# Patient Record
Sex: Male | Born: 1935 | Race: White | Hispanic: No | Smoking: Never smoker
Health system: Southern US, Community
[De-identification: ages and names within clinical notes are randomized; demographics above are authoritative.]

## PROBLEM LIST (undated history)

## (undated) DIAGNOSIS — T07XXXA Unspecified multiple injuries, initial encounter: Secondary | ICD-10-CM

## (undated) DIAGNOSIS — I10 Essential (primary) hypertension: Secondary | ICD-10-CM

## (undated) DIAGNOSIS — C801 Malignant (primary) neoplasm, unspecified: Secondary | ICD-10-CM

## (undated) DIAGNOSIS — Z8546 Personal history of malignant neoplasm of prostate: Secondary | ICD-10-CM

## (undated) DIAGNOSIS — R0789 Other chest pain: Secondary | ICD-10-CM

## (undated) DIAGNOSIS — I6529 Occlusion and stenosis of unspecified carotid artery: Secondary | ICD-10-CM

## (undated) DIAGNOSIS — IMO0002 Reserved for concepts with insufficient information to code with codable children: Secondary | ICD-10-CM

## (undated) DIAGNOSIS — M40209 Unspecified kyphosis, site unspecified: Secondary | ICD-10-CM

## (undated) DIAGNOSIS — E785 Hyperlipidemia, unspecified: Secondary | ICD-10-CM

## (undated) HISTORY — PX: TONSILLECTOMY: SUR1361

## (undated) HISTORY — PX: PROSTATE SURGERY: SHX751

## (undated) HISTORY — DX: Essential (primary) hypertension: I10

## (undated) HISTORY — PX: OTHER SURGICAL HISTORY: SHX169

## (undated) HISTORY — DX: Unspecified multiple injuries, initial encounter: T07.XXXA

## (undated) HISTORY — DX: Reserved for concepts with insufficient information to code with codable children: IMO0002

## (undated) HISTORY — DX: Other chest pain: R07.89

## (undated) HISTORY — DX: Unspecified kyphosis, site unspecified: M40.209

## (undated) HISTORY — DX: Occlusion and stenosis of unspecified carotid artery: I65.29

## (undated) HISTORY — DX: Personal history of malignant neoplasm of prostate: Z85.46

## (undated) HISTORY — DX: Malignant (primary) neoplasm, unspecified: C80.1

## (undated) HISTORY — DX: Hyperlipidemia, unspecified: E78.5

---

## 1977-09-08 HISTORY — PX: INGUINAL HERNIA REPAIR: SUR1180

## 1988-09-08 HISTORY — PX: OTHER SURGICAL HISTORY: SHX169

## 1998-01-15 ENCOUNTER — Emergency Department (HOSPITAL_COMMUNITY): Admission: EM | Admit: 1998-01-15 | Discharge: 1998-01-15 | Payer: Self-pay | Admitting: Emergency Medicine

## 1998-02-20 ENCOUNTER — Emergency Department (HOSPITAL_COMMUNITY): Admission: EM | Admit: 1998-02-20 | Discharge: 1998-02-20 | Payer: Self-pay | Admitting: Critical Care Medicine

## 1998-02-22 ENCOUNTER — Emergency Department (HOSPITAL_COMMUNITY): Admission: EM | Admit: 1998-02-22 | Discharge: 1998-02-23 | Payer: Self-pay | Admitting: Emergency Medicine

## 1999-02-04 ENCOUNTER — Emergency Department (HOSPITAL_COMMUNITY): Admission: EM | Admit: 1999-02-04 | Discharge: 1999-02-04 | Payer: Self-pay | Admitting: Emergency Medicine

## 1999-12-08 LAB — HM DEXA SCAN: HM Dexa Scan: NORMAL

## 2002-09-08 HISTORY — PX: CARDIOVASCULAR STRESS TEST: SHX262

## 2003-01-31 LAB — HM COLONOSCOPY: HM Colonoscopy: ABNORMAL

## 2004-07-09 HISTORY — PX: OTHER SURGICAL HISTORY: SHX169

## 2004-07-18 ENCOUNTER — Ambulatory Visit: Payer: Self-pay | Admitting: Family Medicine

## 2004-07-30 ENCOUNTER — Ambulatory Visit: Payer: Self-pay

## 2005-01-08 ENCOUNTER — Ambulatory Visit: Payer: Self-pay | Admitting: Family Medicine

## 2005-06-22 ENCOUNTER — Emergency Department (HOSPITAL_COMMUNITY): Admission: EM | Admit: 2005-06-22 | Discharge: 2005-06-23 | Payer: Self-pay | Admitting: Emergency Medicine

## 2005-07-04 ENCOUNTER — Ambulatory Visit: Payer: Self-pay | Admitting: Internal Medicine

## 2005-07-08 ENCOUNTER — Ambulatory Visit: Payer: Self-pay | Admitting: Family Medicine

## 2005-07-08 LAB — CONVERTED CEMR LAB: PSA: 0.29 ng/mL

## 2005-09-23 ENCOUNTER — Ambulatory Visit: Payer: Self-pay | Admitting: Family Medicine

## 2005-10-09 ENCOUNTER — Ambulatory Visit: Payer: Self-pay | Admitting: Family Medicine

## 2005-10-13 LAB — FECAL OCCULT BLOOD, GUAIAC: Fecal Occult Blood: NEGATIVE

## 2005-12-17 ENCOUNTER — Ambulatory Visit: Payer: Self-pay | Admitting: Family Medicine

## 2005-12-22 ENCOUNTER — Ambulatory Visit: Payer: Self-pay | Admitting: Family Medicine

## 2006-04-07 ENCOUNTER — Ambulatory Visit: Payer: Self-pay | Admitting: Cardiology

## 2006-04-09 ENCOUNTER — Ambulatory Visit: Payer: Self-pay

## 2006-04-10 ENCOUNTER — Emergency Department (HOSPITAL_COMMUNITY): Admission: EM | Admit: 2006-04-10 | Discharge: 2006-04-10 | Payer: Self-pay | Admitting: Family Medicine

## 2006-05-06 ENCOUNTER — Ambulatory Visit: Payer: Self-pay | Admitting: Family Medicine

## 2006-05-07 ENCOUNTER — Encounter: Admission: RE | Admit: 2006-05-07 | Discharge: 2006-05-07 | Payer: Self-pay | Admitting: Family Medicine

## 2006-06-09 ENCOUNTER — Encounter: Admission: RE | Admit: 2006-06-09 | Discharge: 2006-06-09 | Payer: Self-pay | Admitting: Urology

## 2006-06-10 ENCOUNTER — Ambulatory Visit (HOSPITAL_BASED_OUTPATIENT_CLINIC_OR_DEPARTMENT_OTHER): Admission: RE | Admit: 2006-06-10 | Discharge: 2006-06-10 | Payer: Self-pay | Admitting: Urology

## 2006-06-10 ENCOUNTER — Encounter (INDEPENDENT_AMBULATORY_CARE_PROVIDER_SITE_OTHER): Payer: Self-pay | Admitting: Specialist

## 2006-08-21 ENCOUNTER — Ambulatory Visit: Payer: Self-pay | Admitting: Family Medicine

## 2007-03-25 ENCOUNTER — Emergency Department (HOSPITAL_COMMUNITY): Admission: EM | Admit: 2007-03-25 | Discharge: 2007-03-25 | Payer: Self-pay | Admitting: Emergency Medicine

## 2007-03-26 ENCOUNTER — Ambulatory Visit: Payer: Self-pay | Admitting: Family Medicine

## 2007-04-27 ENCOUNTER — Ambulatory Visit: Payer: Self-pay

## 2007-11-25 ENCOUNTER — Telehealth: Payer: Self-pay | Admitting: Family Medicine

## 2008-01-03 ENCOUNTER — Telehealth: Payer: Self-pay | Admitting: Family Medicine

## 2008-05-01 ENCOUNTER — Ambulatory Visit: Payer: Self-pay

## 2008-05-01 ENCOUNTER — Ambulatory Visit: Payer: Self-pay | Admitting: Cardiology

## 2009-02-12 ENCOUNTER — Emergency Department (HOSPITAL_COMMUNITY): Admission: EM | Admit: 2009-02-12 | Discharge: 2009-02-12 | Payer: Self-pay | Admitting: Emergency Medicine

## 2009-02-20 ENCOUNTER — Emergency Department (HOSPITAL_COMMUNITY): Admission: EM | Admit: 2009-02-20 | Discharge: 2009-02-20 | Payer: Self-pay | Admitting: Emergency Medicine

## 2009-02-21 ENCOUNTER — Ambulatory Visit: Payer: Self-pay | Admitting: Family Medicine

## 2009-02-21 DIAGNOSIS — E7800 Pure hypercholesterolemia, unspecified: Secondary | ICD-10-CM | POA: Insufficient documentation

## 2009-02-21 DIAGNOSIS — Z8546 Personal history of malignant neoplasm of prostate: Secondary | ICD-10-CM | POA: Insufficient documentation

## 2009-02-23 ENCOUNTER — Encounter: Payer: Self-pay | Admitting: Family Medicine

## 2009-02-23 DIAGNOSIS — Z8781 Personal history of (healed) traumatic fracture: Secondary | ICD-10-CM | POA: Insufficient documentation

## 2009-02-23 DIAGNOSIS — G8929 Other chronic pain: Secondary | ICD-10-CM | POA: Insufficient documentation

## 2009-02-23 DIAGNOSIS — M549 Dorsalgia, unspecified: Secondary | ICD-10-CM

## 2009-02-23 DIAGNOSIS — I6529 Occlusion and stenosis of unspecified carotid artery: Secondary | ICD-10-CM | POA: Insufficient documentation

## 2009-02-23 DIAGNOSIS — M542 Cervicalgia: Secondary | ICD-10-CM | POA: Insufficient documentation

## 2009-02-23 DIAGNOSIS — I10 Essential (primary) hypertension: Secondary | ICD-10-CM | POA: Insufficient documentation

## 2009-02-23 DIAGNOSIS — M47817 Spondylosis without myelopathy or radiculopathy, lumbosacral region: Secondary | ICD-10-CM | POA: Insufficient documentation

## 2009-02-23 LAB — CONVERTED CEMR LAB
ALT: 20 units/L (ref 0–53)
AST: 25 units/L (ref 0–37)
BUN: 13 mg/dL (ref 6–23)
CO2: 29 meq/L (ref 19–32)
Calcium: 8.8 mg/dL (ref 8.4–10.5)
Chloride: 96 meq/L (ref 96–112)
Cholesterol: 128 mg/dL (ref 0–200)
Creatinine, Ser: 0.9 mg/dL (ref 0.4–1.5)
GFR calc non Af Amer: 84.86 mL/min (ref >60)
Glucose, Bld: 89 mg/dL (ref 70–99)
HDL: 39 mg/dL — ABNORMAL LOW (ref >39.00)
LDL Cholesterol: 79 mg/dL (ref 0–99)
PSA: 0.18 ng/mL (ref 0.10–4.00)
Potassium: 4.3 meq/L (ref 3.5–5.1)
Sodium: 135 meq/L (ref 135–145)
Total CHOL/HDL Ratio: 3
Triglycerides: 49 mg/dL (ref 0.0–149.0)
VLDL: 9.8 mg/dL (ref 0.0–40.0)

## 2009-09-10 ENCOUNTER — Telehealth: Payer: Self-pay | Admitting: Cardiology

## 2009-11-09 ENCOUNTER — Emergency Department (HOSPITAL_COMMUNITY): Admission: EM | Admit: 2009-11-09 | Discharge: 2009-11-09 | Payer: Self-pay | Admitting: Family Medicine

## 2009-11-09 ENCOUNTER — Telehealth: Payer: Self-pay | Admitting: Cardiology

## 2009-11-09 ENCOUNTER — Telehealth: Payer: Self-pay | Admitting: Family Medicine

## 2009-12-07 ENCOUNTER — Inpatient Hospital Stay (HOSPITAL_COMMUNITY): Admission: EM | Admit: 2009-12-07 | Discharge: 2009-12-08 | Payer: Self-pay | Admitting: Emergency Medicine

## 2009-12-07 ENCOUNTER — Ambulatory Visit: Payer: Self-pay | Admitting: Cardiology

## 2009-12-07 ENCOUNTER — Telehealth: Payer: Self-pay | Admitting: Cardiology

## 2009-12-07 DIAGNOSIS — R0789 Other chest pain: Secondary | ICD-10-CM

## 2009-12-07 HISTORY — DX: Other chest pain: R07.89

## 2009-12-10 ENCOUNTER — Telehealth: Payer: Self-pay | Admitting: Cardiology

## 2009-12-12 ENCOUNTER — Telehealth: Payer: Self-pay | Admitting: Cardiology

## 2009-12-26 ENCOUNTER — Telehealth: Payer: Self-pay | Admitting: Cardiology

## 2010-01-08 ENCOUNTER — Ambulatory Visit: Payer: Self-pay | Admitting: Cardiology

## 2010-01-16 ENCOUNTER — Ambulatory Visit: Payer: Self-pay | Admitting: Family Medicine

## 2010-01-16 DIAGNOSIS — R350 Frequency of micturition: Secondary | ICD-10-CM | POA: Insufficient documentation

## 2010-01-16 LAB — CONVERTED CEMR LAB
Bilirubin Urine: NEGATIVE
Blood in Urine, dipstick: NEGATIVE
Casts: 0 /lpf
Glucose, Urine, Semiquant: NEGATIVE
Ketones, urine, test strip: NEGATIVE
Nitrite: NEGATIVE
Protein, U semiquant: NEGATIVE
RBC / HPF: 0
Specific Gravity, Urine: 1.01
Urobilinogen, UA: 0.2
WBC Urine, dipstick: NEGATIVE
Yeast, UA: 0
pH: 8

## 2010-01-17 LAB — CONVERTED CEMR LAB
Albumin: 3.9 g/dL (ref 3.5–5.2)
BUN: 21 mg/dL (ref 6–23)
Basophils Absolute: 0 10*3/uL (ref 0.0–0.1)
Basophils Relative: 0.3 % (ref 0.0–3.0)
CO2: 29 meq/L (ref 19–32)
Calcium: 8.9 mg/dL (ref 8.4–10.5)
Chloride: 96 meq/L (ref 96–112)
Cholesterol: 103 mg/dL (ref 0–200)
Creatinine, Ser: 0.9 mg/dL (ref 0.4–1.5)
Eosinophils Absolute: 0.1 10*3/uL (ref 0.0–0.7)
Eosinophils Relative: 1.6 % (ref 0.0–5.0)
GFR calc non Af Amer: 90.46 mL/min (ref >60)
Glucose, Bld: 89 mg/dL (ref 70–99)
HCT: 37.2 % — ABNORMAL LOW (ref 39.0–52.0)
HDL: 32.5 mg/dL — ABNORMAL LOW (ref >39.00)
Hemoglobin: 12.8 g/dL — ABNORMAL LOW (ref 13.0–17.0)
LDL Cholesterol: 54 mg/dL (ref 0–99)
Lymphocytes Relative: 12.4 % (ref 12.0–46.0)
Lymphs Abs: 1.1 10*3/uL (ref 0.7–4.0)
MCHC: 34.4 g/dL (ref 30.0–36.0)
MCV: 96.8 fL (ref 78.0–100.0)
Monocytes Absolute: 1 10*3/uL (ref 0.1–1.0)
Monocytes Relative: 11.3 % (ref 3.0–12.0)
Neutro Abs: 6.5 10*3/uL (ref 1.4–7.7)
Neutrophils Relative %: 74.4 % (ref 43.0–77.0)
Phosphorus: 3.1 mg/dL (ref 2.3–4.6)
Platelets: 410 10*3/uL — ABNORMAL HIGH (ref 150.0–400.0)
Potassium: 5.2 meq/L — ABNORMAL HIGH (ref 3.5–5.1)
RBC: 3.84 M/uL — ABNORMAL LOW (ref 4.22–5.81)
RDW: 12.9 % (ref 11.5–14.6)
Sodium: 131 meq/L — ABNORMAL LOW (ref 135–145)
TSH: 5.75 microintl units/mL — ABNORMAL HIGH (ref 0.35–5.50)
Total CHOL/HDL Ratio: 3
Triglycerides: 81 mg/dL (ref 0.0–149.0)
VLDL: 16.2 mg/dL (ref 0.0–40.0)
WBC: 8.7 10*3/uL (ref 4.5–10.5)

## 2010-01-21 ENCOUNTER — Ambulatory Visit: Payer: Self-pay | Admitting: Cardiology

## 2010-01-21 ENCOUNTER — Ambulatory Visit: Payer: Self-pay

## 2010-01-23 ENCOUNTER — Ambulatory Visit: Payer: Self-pay | Admitting: Family Medicine

## 2010-01-23 DIAGNOSIS — D649 Anemia, unspecified: Secondary | ICD-10-CM | POA: Insufficient documentation

## 2010-01-24 LAB — CONVERTED CEMR LAB
Free T4: 0.8 ng/dL (ref 0.6–1.6)
T3 Uptake Ratio: 40.8 % — ABNORMAL HIGH (ref 22.5–37.0)
TSH: 4.69 microintl units/mL (ref 0.35–5.50)

## 2010-02-01 ENCOUNTER — Telehealth: Payer: Self-pay | Admitting: Cardiology

## 2010-02-01 ENCOUNTER — Encounter (INDEPENDENT_AMBULATORY_CARE_PROVIDER_SITE_OTHER): Payer: Self-pay | Admitting: *Deleted

## 2010-02-01 ENCOUNTER — Ambulatory Visit: Payer: Self-pay | Admitting: Family Medicine

## 2010-02-01 ENCOUNTER — Encounter: Admission: RE | Admit: 2010-02-01 | Discharge: 2010-02-01 | Payer: Self-pay | Admitting: Family Medicine

## 2010-02-01 DIAGNOSIS — R195 Other fecal abnormalities: Secondary | ICD-10-CM | POA: Insufficient documentation

## 2010-02-01 LAB — CONVERTED CEMR LAB: Fecal Occult Bld: POSITIVE

## 2010-02-02 ENCOUNTER — Emergency Department (HOSPITAL_COMMUNITY): Admission: EM | Admit: 2010-02-02 | Discharge: 2010-02-02 | Payer: Self-pay | Admitting: Emergency Medicine

## 2010-02-05 ENCOUNTER — Encounter: Payer: Self-pay | Admitting: Family Medicine

## 2010-02-06 ENCOUNTER — Encounter: Admission: RE | Admit: 2010-02-06 | Discharge: 2010-02-06 | Payer: Self-pay | Admitting: Sports Medicine

## 2010-02-07 ENCOUNTER — Encounter: Payer: Self-pay | Admitting: Family Medicine

## 2010-03-15 ENCOUNTER — Encounter: Payer: Self-pay | Admitting: Cardiology

## 2010-03-25 ENCOUNTER — Telehealth: Payer: Self-pay | Admitting: Cardiology

## 2010-04-26 ENCOUNTER — Emergency Department (HOSPITAL_COMMUNITY): Admission: EM | Admit: 2010-04-26 | Discharge: 2010-04-26 | Payer: Self-pay | Admitting: Family Medicine

## 2010-04-29 ENCOUNTER — Telehealth: Payer: Self-pay | Admitting: Family Medicine

## 2010-05-01 ENCOUNTER — Ambulatory Visit: Payer: Self-pay | Admitting: Family Medicine

## 2010-05-01 LAB — CONVERTED CEMR LAB
BUN: 20 mg/dL (ref 6–23)
Basophils Absolute: 0 10*3/uL (ref 0.0–0.1)
Basophils Relative: 0.3 % (ref 0.0–3.0)
CO2: 28 meq/L (ref 19–32)
Calcium: 9.3 mg/dL (ref 8.4–10.5)
Chloride: 104 meq/L (ref 96–112)
Creatinine, Ser: 0.9 mg/dL (ref 0.4–1.5)
Eosinophils Absolute: 0.1 10*3/uL (ref 0.0–0.7)
Eosinophils Relative: 1.4 % (ref 0.0–5.0)
Free T4: 0.69 ng/dL (ref 0.60–1.60)
GFR calc non Af Amer: 80.48 mL/min (ref >60)
Glucose, Bld: 73 mg/dL (ref 70–99)
HCT: 43.9 % (ref 39.0–52.0)
Hemoglobin: 15.1 g/dL (ref 13.0–17.0)
Lymphocytes Relative: 14.1 % (ref 12.0–46.0)
Lymphs Abs: 1.2 10*3/uL (ref 0.7–4.0)
MCHC: 34.5 g/dL (ref 30.0–36.0)
MCV: 97.9 fL (ref 78.0–100.0)
Monocytes Absolute: 1 10*3/uL (ref 0.1–1.0)
Monocytes Relative: 11.5 % (ref 3.0–12.0)
Neutro Abs: 6.2 10*3/uL (ref 1.4–7.7)
Neutrophils Relative %: 72.7 % (ref 43.0–77.0)
Platelets: 292 10*3/uL (ref 150.0–400.0)
Potassium: 4.4 meq/L (ref 3.5–5.1)
RBC: 4.49 M/uL (ref 4.22–5.81)
RDW: 15.6 % — ABNORMAL HIGH (ref 11.5–14.6)
Sodium: 139 meq/L (ref 135–145)
TSH: 4.86 microintl units/mL (ref 0.35–5.50)
WBC: 8.6 10*3/uL (ref 4.5–10.5)

## 2010-05-07 ENCOUNTER — Ambulatory Visit: Payer: Self-pay | Admitting: Family Medicine

## 2010-05-09 ENCOUNTER — Telehealth: Payer: Self-pay | Admitting: Cardiology

## 2010-05-09 ENCOUNTER — Encounter: Payer: Self-pay | Admitting: Cardiology

## 2010-05-20 ENCOUNTER — Telehealth: Payer: Self-pay | Admitting: Family Medicine

## 2010-05-22 ENCOUNTER — Ambulatory Visit: Payer: Self-pay | Admitting: Family Medicine

## 2010-07-11 ENCOUNTER — Encounter: Payer: Self-pay | Admitting: Family Medicine

## 2010-07-18 ENCOUNTER — Ambulatory Visit: Payer: Self-pay | Admitting: Family Medicine

## 2010-07-18 DIAGNOSIS — M4000 Postural kyphosis, site unspecified: Secondary | ICD-10-CM | POA: Insufficient documentation

## 2010-07-23 LAB — CONVERTED CEMR LAB
ALT: 22 units/L (ref 0–53)
AST: 27 units/L (ref 0–37)
Albumin: 3.6 g/dL (ref 3.5–5.2)
BUN: 22 mg/dL (ref 6–23)
Basophils Absolute: 0 10*3/uL (ref 0.0–0.1)
Basophils Relative: 0.3 % (ref 0.0–3.0)
CO2: 28 meq/L (ref 19–32)
Calcium: 9.2 mg/dL (ref 8.4–10.5)
Chloride: 103 meq/L (ref 96–112)
Cholesterol: 138 mg/dL (ref 0–200)
Creatinine, Ser: 1.1 mg/dL (ref 0.4–1.5)
Eosinophils Absolute: 0.3 10*3/uL (ref 0.0–0.7)
Eosinophils Relative: 3.5 % (ref 0.0–5.0)
GFR calc non Af Amer: 65.72 mL/min (ref >60)
Glucose, Bld: 74 mg/dL (ref 70–99)
HCT: 41.5 % (ref 39.0–52.0)
HDL: 38.5 mg/dL — ABNORMAL LOW (ref >39.00)
Hemoglobin: 14.1 g/dL (ref 13.0–17.0)
LDL Cholesterol: 79 mg/dL (ref 0–99)
Lymphocytes Relative: 12.5 % (ref 12.0–46.0)
Lymphs Abs: 0.9 10*3/uL (ref 0.7–4.0)
MCHC: 34 g/dL (ref 30.0–36.0)
MCV: 102.5 fL — ABNORMAL HIGH (ref 78.0–100.0)
Monocytes Absolute: 0.9 10*3/uL (ref 0.1–1.0)
Monocytes Relative: 12.3 % — ABNORMAL HIGH (ref 3.0–12.0)
Neutro Abs: 5.4 10*3/uL (ref 1.4–7.7)
Neutrophils Relative %: 71.4 % (ref 43.0–77.0)
Phosphorus: 2.9 mg/dL (ref 2.3–4.6)
Platelets: 289 10*3/uL (ref 150.0–400.0)
Potassium: 4.6 meq/L (ref 3.5–5.1)
RBC: 4.05 M/uL — ABNORMAL LOW (ref 4.22–5.81)
RDW: 13.5 % (ref 11.5–14.6)
Sodium: 137 meq/L (ref 135–145)
Total CHOL/HDL Ratio: 4
Triglycerides: 104 mg/dL (ref 0.0–149.0)
VLDL: 20.8 mg/dL (ref 0.0–40.0)
WBC: 7.5 10*3/uL (ref 4.5–10.5)

## 2010-07-29 ENCOUNTER — Ambulatory Visit: Payer: Self-pay | Admitting: Family Medicine

## 2010-07-29 DIAGNOSIS — D649 Anemia, unspecified: Secondary | ICD-10-CM | POA: Insufficient documentation

## 2010-07-30 LAB — CONVERTED CEMR LAB: Vitamin B-12: 870 pg/mL (ref 211–911)

## 2010-07-31 ENCOUNTER — Ambulatory Visit: Payer: Self-pay | Admitting: Family Medicine

## 2010-08-07 ENCOUNTER — Ambulatory Visit: Payer: Self-pay | Admitting: Family Medicine

## 2010-08-14 ENCOUNTER — Ambulatory Visit: Payer: Self-pay | Admitting: Family Medicine

## 2010-08-21 ENCOUNTER — Ambulatory Visit: Payer: Self-pay | Admitting: Family Medicine

## 2010-08-27 ENCOUNTER — Ambulatory Visit: Payer: Self-pay | Admitting: Family Medicine

## 2010-08-28 LAB — CONVERTED CEMR LAB
Basophils Absolute: 0 10*3/uL (ref 0.0–0.1)
Basophils Relative: 0.2 % (ref 0.0–3.0)
Eosinophils Absolute: 0.3 10*3/uL (ref 0.0–0.7)
Eosinophils Relative: 3.5 % (ref 0.0–5.0)
Folate: >20 ng/mL
HCT: 40.5 % (ref 39.0–52.0)
Hemoglobin: 13.9 g/dL (ref 13.0–17.0)
Lymphocytes Relative: 12.8 % (ref 12.0–46.0)
Lymphs Abs: 1 10*3/uL (ref 0.7–4.0)
MCHC: 34.3 g/dL (ref 30.0–36.0)
MCV: 102.2 fL — ABNORMAL HIGH (ref 78.0–100.0)
Monocytes Absolute: 1 10*3/uL (ref 0.1–1.0)
Monocytes Relative: 12.7 % — ABNORMAL HIGH (ref 3.0–12.0)
Neutro Abs: 5.8 10*3/uL (ref 1.4–7.7)
Neutrophils Relative %: 70.8 % (ref 43.0–77.0)
Platelets: 270 10*3/uL (ref 150.0–400.0)
RBC: 3.97 M/uL — ABNORMAL LOW (ref 4.22–5.81)
RDW: 12.7 % (ref 11.5–14.6)
Vitamin B-12: 1170 pg/mL — ABNORMAL HIGH (ref 211–911)
WBC: 8.2 10*3/uL (ref 4.5–10.5)

## 2010-09-10 ENCOUNTER — Telehealth: Payer: Self-pay | Admitting: Cardiology

## 2010-09-12 ENCOUNTER — Ambulatory Visit
Admission: RE | Admit: 2010-09-12 | Discharge: 2010-09-12 | Payer: Self-pay | Source: Home / Self Care | Attending: Family Medicine | Admitting: Family Medicine

## 2010-09-12 ENCOUNTER — Encounter: Payer: Self-pay | Admitting: Family Medicine

## 2010-10-06 LAB — CONVERTED CEMR LAB
BUN: 18 mg/dL (ref 6–23)
CO2: 28 meq/L (ref 19–32)
Calcium: 8.9 mg/dL (ref 8.4–10.5)
Chloride: 99 meq/L (ref 96–112)
Creatinine, Ser: 0.9 mg/dL (ref 0.4–1.5)
GFR calc non Af Amer: 85.78 mL/min (ref >60)
Glucose, Bld: 101 mg/dL — ABNORMAL HIGH (ref 70–99)
Potassium: 4.7 meq/L (ref 3.5–5.1)
Sodium: 133 meq/L — ABNORMAL LOW (ref 135–145)

## 2010-10-10 NOTE — Progress Notes (Signed)
Summary: Leg pain  Phone Note Call from Patient Call back at Home Phone [phone]   Caller: Patient Call For: Gregory Part MD Summary of Call: Patient complains of right leg pain.  Pain near hip joint and has moved from hip down to the knee and now his entire leg is in pain.  No swelling, no redness, no SOB, no wheezing, no dizziness, no headaches.  Pain started after 10:00 this morning, patient has not changed medications, exercise patterns.  He did drive for one hour yesterday morning and one hour yesterday afternoon.  Please advise Initial call taken by: [name] CMA [name]),  November 09, 2009 2:13 PM  Follow-up for Phone Call        Spoke with Dr. Milinda Antis who advised me to have patient go to urgent care or the ER.  Preferrably [name] urgent care or ER.  We do not have ultrasound capabilities here in the office and the patient needs to be evaluated right away.  Patient advised as instructed.  Asked him to call us back on Monday and let us know how he is doing and how things went.   Follow-up by: [name] CMA [name]),  November 09, 2009 2:19 PM  Additional Follow-up for Phone Call Additional follow up Details #1::        agree with above  Additional Follow-up by: Gregory Part MD,  November 09, 2009 2:30 PM

## 2010-10-10 NOTE — Progress Notes (Signed)
Summary: can med be changed trandolapril 3 mg vs 2 mg   call back after 1   Phone Note Call from Patient Call back at Home Phone [phone] Message from:  Patient on May 09, 2010 9:02 AM  Refills Requested: Medication #1:  TRANDOLAPRIL 2 MG TABS one by mouth once daily [facility] [address]    Method Requested: Fax to Local Pharmacy Initial call taken by: Lorne Skeens,  May 09, 2010 9:04 AM Caller: Patient Reason for Call: Talk to Nurse Summary of Call: Can TRANDOLAPRIL 2 MG, be changed to 3 mg.  Initial call taken by: Lorne Skeens,  May 09, 2010 9:04 AM  Follow-up for Phone Call        called pt but he is unavailable, will call back after 1:30pm  Sander Nephew, RN  wanted rx to be sent to [facility] for 3 mg tablets - explained to pt the medication only comes in 1mg , 2mg  and 4 mg  tablets.  [facility] Follow-up by: Charolotte Capuchin, RN,  May 09, 2010 5:07 PM

## 2010-10-10 NOTE — Miscellaneous (Signed)
Summary: BONE DENSITY  Clinical Lists Changes  Orders: Added new Test order of T-Bone Densitometry (77080) - Signed Added new Test order of T-Lumbar Vertebral Assessment (77082) - Signed 

## 2010-10-10 NOTE — Letter (Signed)
Summary: [facility]  [facility]   Imported By: Lanelle Bal 02/12/2010 10:40:10  _____________________________________________________________________  External Attachment:    Type:   Image     Comment:   External Document

## 2010-10-10 NOTE — Progress Notes (Signed)
Summary: REFILL  Medications Added TRANDOLAPRIL 1 MG TABS (TRANDOLAPRIL) 1 and 1/2 by mouth daily       Phone Note Refill Request Message from:  Patient on September 10, 2010 3:36 PM  Refills Requested: Medication #1:  TRANDOLAPRIL 2 MG TABS one and 1/2 tablet daily [facility] [phone] 90 DAY [facility]PPLY  Initial call taken by: Judie Grieve,  September 10, 2010 3:37 PM  Follow-up for Phone Call        RX sent into pharmacy. Marrion Coy, CNA  September 11, 2010 2:06 PM  Follow-up by: Marrion Coy, CNA,  September 11, 2010 2:06 PM    New/Updated Medications: TRANDOLAPRIL 1 MG TABS (TRANDOLAPRIL) 1 and 1/2 by mouth daily Prescriptions: TRANDOLAPRIL 1 MG TABS (TRANDOLAPRIL) 1 and 1/2 by mouth daily  #135 x 1   Entered by:   Marrion Coy, CNA   Authorized by:   Rollene Rotunda, MD, [facility]   Signed by:   Marrion Coy, CNA on 09/10/2010   Method used:   [facility]        [facility]  [address] [phone]* (retail)       [address]       [place], [place]  [address]       Ph: [phone] or [phone]       Fax: [phone]   RxID:   [id number]

## 2010-10-10 NOTE — Miscellaneous (Signed)
Summary: rx for trandolapril  Clinical Lists Changes  Medications: Changed medication from TRANDOLAPRIL 2 MG TABS (TRANDOLAPRIL) one by mouth once daily to TRANDOLAPRIL 2 MG TABS (TRANDOLAPRIL) one and 1/2 tablet daily - Signed Rx of TRANDOLAPRIL 2 MG TABS (TRANDOLAPRIL) one and 1/2 tablet daily;  #135 x 3;  Signed;  Entered by: Charolotte Capuchin, RN;  Authorized by: Rollene Rotunda, MD, [facility];  Method used: Faxed to [facility], [address] [address], [place], [place]  [address], Ph: [phone], Fax: [phone]    Prescriptions: TRANDOLAPRIL 2 MG TABS (TRANDOLAPRIL) one and 1/2 tablet daily  #135 x 3   Entered by:   Charolotte Capuchin, RN   Authorized by:   Rollene Rotunda, MD, [facility]   Signed by:   Charolotte Capuchin, RN on 05/09/2010   Method used:   Faxed to ...       [facility] Environmental education officer)       [address] [address]       [place], [place]  [address]       Ph: [phone]       Fax: [phone]   RxID:   [id number]

## 2010-10-10 NOTE — Assessment & Plan Note (Signed)
Summary: IMBEDDED TICK IN LEG   Vital Signs:  Patient profile:   77 year old male Height:      63.75 inches Weight:      152.75 pounds BMI:     26.52 Temp:     97.9 degrees F oral Pulse rate:   68 / minute Pulse rhythm:   regular BP sitting:   130 / 56  (left arm) Cuff size:   regular  Vitals Entered By: Delilah Shan CMA Dyann Goodspeed Dull) (May 07, 2010 10:32 AM) CC: Embedded tick in leg   History of Present Illness: Recently seen at [facility] and started on doxy for cellulitis on r lower leg.  Resolved, no fever, not tender to palpation.  No erythema.  Border prev marked- he has resolution of the lesion.  tolerated doxy well.   Tick found yesterday on R upper posterior thigh.  Feeling well o/w.  Needing this removed.   Allergies: 1)  * Hctz  Review of Systems       See HPI.  Otherwise negative.    Physical Exam  General:  A&O in NAD R leg cellulitis border still marked but there is no fluctuance, erythema, tenderness.  This appears to be healed. Small embedded tick removed from posterior R upper thigh after area was cleaned with alcohol.  Tolerated well.  Minimal erythema at the site but no mass.  Tick was inspected and appears to have been removed intact.    Impression & Recommendations:  Problem # 1:  CELLULITIS (ICD-682.9)  Resolved.  No further tx needed.  AF.  No need to keep original appointment for tomorrow.  His updated medication list for this problem includes:    Doxycycline Hyclate 100 Mg Caps (Doxycycline hyclate) .Marland Kitchen... 1 by mouth two times a day x10 days  Problem # 2:  TICK BITE (ICD-E906.4) Restart doxy since tick was embedded and follow up as needed.   Routine instructions given re:sun exposure.  follow up as needed.  No indication for serologic work up in this patinet.   Complete Medication List: 1)  Adult Aspirin Ec Low Strength 81 Mg Tbec (Aspirin) .... Take 1 tablet by mouth once a day 2)  Eq Loratadine 10 Mg Tabs (Loratadine) .... Take 1 tablet by mouth once  a day as needed 3)  Trandolapril 2 Mg Tabs (Trandolapril) .... One by mouth once daily 4)  Vesicare 10 Mg Tabs (Solifenacin succinate) .... One daily 5)  Selenium 200 Mcg Tabs (Selenium) .Marland Kitchen.. 1 podaily 6)  Trandolapril 1 Mg Tabs (Trandolapril) .... Take one daily along with 2 mg tablet for a total of 3 mg a day 7)  Doxycycline Hyclate 100 Mg Caps (Doxycycline hyclate) .Marland Kitchen.. 1 by mouth two times a day x10 days  Patient Instructions: 1)  Let me know if the area gets red, increases in size or if you have any fevers.   2)  Please schedule a follow-up appointment as needed .  Prescriptions: DOXYCYCLINE HYCLATE 100 MG CAPS (DOXYCYCLINE HYCLATE) 1 by mouth two times a day x10 days  #20 x 0   Entered and Authorized by:   Crawford Givens MD   Signed by:   Crawford Givens MD on 05/07/2010   Method used:   [facility] to        [facility]  [address] [phone]* (retail)       [address]       [place], [place]  [address]  Ph: [phone] or [phone]       Fax: [phone]   RxID:   [id number]   Current Allergies (reviewed today): * HCTZ

## 2010-10-10 NOTE — Assessment & Plan Note (Signed)
Summary: RT ANKLE SWOLLEN AND PAINFUL/RI   Vital Signs:  Patient profile:   77 year old male Height:      63.75 inches Weight:      154 pounds BMI:     26.74 Temp:     97.6 degrees F oral Pulse rate:   64 / minute Pulse rhythm:   regular BP sitting:   128 / 70  (left arm) Cuff size:   regular  Vitals Entered By: Lewanda Rife LPN (Feb 01, 2010 12:25 PM) CC: rt ankle pain and swollen with blu black discoloration. Pain scale now is 0. Pt wonders if bumped this area.   History of Present Illness: R ankle is bothering him  started with medial area of tenderness  then more painful and bruised  ? if bumped against something  had a different pair of shoes on and caught it under a rug -- felt that more in his back  that was 1-2 mo ago   is walking ok - not that painful just wanted to get it checked out  earlier today - immunoassay stool card returned positive  pt has mild anemia he is open to seeing GI for this -- but is hesitant to get another colonosc due to bad exp with prep last time  no stool changes has lost a little wt -- with decreased appetite lately  Allergies: 1)  * Hctz  Past History:  Past Medical History: Last updated: 02-14-10 Hypertension carotid artery stenosis  hyperlipidemia  hx of prostate ca  degenerative disc dz     cardiol- Dr Antoine Poche urology -- Dr Aldean Ast   Past Surgical History: Last updated: 02-14-2010 prostate surgery 1998 herniated disc--L3-4--1990  inguinal hernia R --1979 Tonsillectomy Prostate cancer surgery Stress test- cardiolite negative (2004) Abd [place]- normal (2004) Colonoscopy- polyps, diverticulitis,hemmorhoids (01/2003) Carotid doppler 0-39% bilateral (07/2004) Dexa- normal (12/1999) 4/11- hosp for non cardiac CP- ? 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 Eyes:  Denies blurring and eye irritation. CV:  Denies chest pain or discomfort and lightheadness. Resp:  Denies cough, shortness of breath, and wheezing. GI:  Denies abdominal pain, change in bowel habits, indigestion, nausea, and vomiting. GU:  Complains of incontinence and urinary frequency. MS:  Complains of joint pain and joint swelling; denies joint redness, muscle aches, cramps, and muscle weakness. Derm:  Complains of lesion(s); denies itching, poor wound healing, and rash. Neuro:  Denies numbness, tingling, and weakness. Psych:  mood is ok . Endo:  Denies cold intolerance, excessive thirst, excessive urination, and heat intolerance. Heme:  Denies bleeding, enlarge lymph nodes, fevers, and pallor.  Physical Exam  General:  somewhat frail elderly male with some kyphosis Head:  normocephalic, atraumatic, and no abnormalities observed.   Mouth:  pharynx pink and moist.   Neck:  supple with full rom and no masses or thyromegally, no JVD or carotid bruit  Lungs:  normal respiratory effort, no intercostal retractions, no accessory muscle use, normal breath  sounds, no crackles, and no wheezes.   Heart:  Normal rate and regular rhythm. S1 and S2 normal without gallop, murmur, click, rub or other extra sounds. Msk:  R ankle and foot  - mild diffuse swelling and old ecchymosis  nl rom - pain on plantar flex and inversion also 1 -2 cm of induration (superficial ) medial leg just above ankle -- that is slt tender no necrosis/ rash or scab  nl perfusion and sensation in leg   Pulses:   plus one pedal pulses  Extremities:  No clubbing or cyanosis. no palpable cords or calf tenerness neg homann's sign  Neurologic:  sensation intact to light touch, gait normal, and DTRs symmetrical and normal.   Skin:  see [facility] exam - area of induration seen on leg - superficial Inguinal Nodes:  No significant adenopathy Psych:  nl affect    Impression & Recommendations:  Problem # 1:  ANKLE PAIN, RIGHT (ICD-719.47) Assessment New R ankle/ foot pain and swelling with old ecchymosis and small area of induration medially just above ankle  ? injury vs poss stress fx vs insect bite rxn sent for x rays - then adv  ice/elevation adv  update if worse pain or other symptoms  Orders: Radiology Referral (Radiology)  Problem # 2:  EDEMA (ICD-782.3) Assessment: New see above  Orders: Radiology Referral (Radiology)  Problem # 3:  FECAL OCCULT BLOOD (ICD-792.1) Assessment: New pos stool card with mild anemia , also recent dec in appetite and slt wt loss  ref to GI Orders: Gastroenterology Referral (GI)  Complete Medication List: 1)  Zocor 20 Mg Tabs (Simvastatin) .... Take 1 tablet by mouth every night 2)  Adult Aspirin Ec Low Strength 81 Mg Tbec (Aspirin) .... Take 1 tablet by mouth once a day 3)  Eq Loratadine 10 Mg Tabs (Loratadine) .... Take 1 tablet by mouth once a day 4)  Vit E 200 Iu  .... Take 1 tablet by mouth once a day 5)  Trandolapril 2 Mg Tabs (Trandolapril) .... One by mouth once daily 6)  Vesicare 10 Mg Tabs (Solifenacin succinate) .... One daily 7)  Selenium 200 Mcg Tabs (Selenium) .Marland Kitchen.. 1 podaily 8)  Trandolapril 1 Mg Tabs (Trandolapril) .... Take one daily along with 2 mg tablet for a total of 3 mg a day  Patient Instructions: 1)  we will do x ray referral at check out  2)  we will do GI referral at check out  3)  elevate foot -- put ice on sore spot for 10 minutes on , 10 minutes off 4)  I will update you with results so we can make a plan   Current Allergies  (reviewed today): * HCTZ

## 2010-10-10 NOTE — Progress Notes (Signed)
Summary: swollen ankle with pain and color blue /black   Phone Note Call from Patient Call back at [facility] Phone [phone]   Caller: Patient Summary of Call: Pt having pain in ankle and the color is blue/black swollen and very painful Initial call taken by: Judie Grieve,  Feb 01, 2010 8:06 AM  Follow-up for Phone Call        had a sore on his foot and it has gotten worse.  discoloration from ankle to foot.  Thinks he may have bumped it but isn't sure.  It is tender to the touch.  Referred pt to his primary MD.  He is in agreement. Follow-up by: Charolotte Capuchin, RN,  Feb 01, 2010 8:38 AM

## 2010-10-10 NOTE — Progress Notes (Signed)
Summary: refill--trandolapril   Phone Note Refill Request Message from:  Patient on September 10, 2009 2:59 PM  Refills Requested: Medication #1:  TRANDOLAPRIL 2 MG TABS one daily [facility]   Method Requested: Fax to [facility] Initial call taken by: Migdalia Dk,  September 10, 2009 2:59 PM Reason for Call: Talk to Nurse  Follow-up for Phone Call        RX sent into pharmacy. NO answer at number above. Marrion Coy, CNA  September 11, 2009 10:14 AM  Follow-up by: Marrion Coy, CNA,  September 11, 2009 10:14 AM    Prescriptions: TRANDOLAPRIL 2 MG TABS (TRANDOLAPRIL) one daily  #90 x 1   Entered by:   Marrion Coy, CNA   Authorized by:   Rollene Rotunda, MD, [facility]   Signed by:   Marrion Coy, CNA on 09/11/2009   Method used:   Faxed to ...       [facility] Environmental education officer)       [address] [address]       [place], [place]  [address]       Ph: [phone]       Fax: [phone]   RxID:   [id number]

## 2010-10-10 NOTE — Assessment & Plan Note (Signed)
Summary: FOLLOW UP FROM [facility]  Medications Added ZOCOR 20 MG TABS (SIMVASTATIN) Take 1 tablet by mouth every night TARKA 2-180 MG  TBCR (TRANDOLAPRIL-VERAPAMIL HCL) Take 1 tablet by mouth once a day ADULT ASPIRIN EC LOW STRENGTH 81 MG  TBEC (ASPIRIN) Take 1 tablet by mouth once a day ZOCOR 20 MG  TABS (SIMVASTATIN) Take 1 tablet by mouth once a day EQ LORATADINE 10 MG  TABS (LORATADINE) Take 1 tablet by mouth once a day * VIT E 200 IU Take 1 tablet by mouth once a day CLARINEX 5 MG  TABS (DESLORATADINE) 1 daily [BMN]      Allergies Added: * HCTZ  Vital Signs:  Patient Profile:   77 Years Old Male Weight:      157 pounds Temp:     98.5 degrees F oral Pulse rate:   70 / minute BP sitting:   165 / 76  (left arm) Cuff size:   regular  Vitals Entered By: Cooper Render (March 26, 2007 12:32 PM)               Chief Complaint:  wasp stings.  History of Present Illness: Numerous wasp stings on 7/16--evening, went to [facility]--started on Benadryl and doxycycline for cellulitis.  Better today but still itches and has some swelling/eryth and edema both lower arms.  Taking Benadryl regularly, but not q4h always, does not make sleepy.  No drainage from sting areas.  Current Allergies: * HCTZ     Review of Systems      See HPI   Physical Exam  General:     alert, well-developed, well-nourished, and well-hydrated.   Head:     normocephalic.   Eyes:     glassesno injection.   Lungs:     normal respiratory effort and normal breath sounds.   Extremities:     edema R lower arm> L--more sting sites Neurologic:     alert & oriented X3, sensation intact to light touch, and gait normal.   Skin:     eryth and edema both lower arms,R>L.  Skin intact throughout Psych:     Oriented X3, normally interactive, and good eye contact.      Impression & Recommendations:  Problem # 1:  ACCIDENT DUE TO HORNET/WASP/BEE (ICD-E905.3) Assessment: New trial of  Clarinex 5mg  1 daily for 5 d, hold the Benadryl cool compresses or ice to area as needed itch/swelling see back if worsens or does not reaolve may d/c doxycycline  Medications Added to Medication List This Visit: 1)  Zocor 20 Mg Tabs (Simvastatin) .... Take 1 tablet by mouth every night 2)  Tarka 2-180 Mg Tbcr (Trandolapril-verapamil hcl) .... Take 1 tablet by mouth once a day 3)  Adult Aspirin Ec Low Strength 81 Mg Tbec (Aspirin) .... Take 1 tablet by mouth once a day 4)  Zocor 20 Mg Tabs (Simvastatin) .... Take 1 tablet by mouth once a day 5)  Eq Loratadine 10 Mg Tabs (Loratadine) .... Take 1 tablet by mouth once a day 6)  Vit E 200 Iu  .... Take 1 tablet by mouth once a day 7)  Clarinex 5 Mg Tabs (Desloratadine) .Marland Kitchen.. 1 daily   Patient Instructions: 1)  reviewed plan with patient--agrees    Prescriptions: CLARINEX 5 MG  TABS (DESLORATADINE) 1 daily Brand medically necessary #5 x 0   Entered and Authorized by:   Gildardo Griffes FNP   Signed by:   Gildardo Griffes FNP on 03/26/2007  Method used:   Print then Give to Patient   RxID:   [id number]

## 2010-10-10 NOTE — Letter (Signed)
Summary: Dr.Houston Kimbrough,[facility],Note  Dr.Houston Kimbrough,[facility],Note   Imported By: Beau Fanny 07/19/2010 15:03:26  _____________________________________________________________________  External Attachment:    Type:   Image     Comment:   External Document

## 2010-10-10 NOTE — Letter (Signed)
Summary: New Patient letter  [facility]  [address] [place], [place] [address]   Phone: [phone]  Fax: [phone]       02/01/2010 MRN: [id number]  [facility] [address] [place], [place]  [address]  Dear [name],  Welcome to the Gastroenterology Division at [facility].    You are scheduled to see Dr.  Leone Payor on 03-14-10 at 2:45pm on the 3rd floor at [facility], [address]  We ask that you try to arrive at our office 15 minutes prior to your appointment time to allow for check-in.  We would like you to complete the enclosed self-administered evaluation form prior to your visit and bring it with you on the day of your appointment.  We will review it with you.  Also, please bring a complete list of all your medications or, if you prefer, bring the medication bottles and we will list them.  Please bring your insurance card so that we may make a copy of it.  If your insurance requires a referral to see a specialist, please bring your referral form from your primary care physician.  Co-payments are due at the time of your visit and may be paid by cash, check or credit card.     Your office visit will consist of a consult with your physician (includes a physical exam), any laboratory testing he/she may order, scheduling of any necessary diagnostic testing (e.g. x-ray, ultrasound, CT-scan), and scheduling of a procedure (e.g. Endoscopy, Colonoscopy) if required.  Please allow enough time on your schedule to allow for any/all of these possibilities.    If you cannot keep your appointment, please call [phone] to cancel or reschedule prior to your appointment date.  This allows [place] the opportunity to schedule an appointment for another patient in need of care.  If you do not cancel or reschedule by 5 p.m. the business day prior to your appointment date, you will be charged a $50.00 late cancellation/no-show fee.    Thank you for choosing [facility] for your medical needs.  We appreciate the opportunity to care for you.  Please visit [place] at our website  to learn more about our practice.                     Sincerely,                                                             The Gastroenterology Division

## 2010-10-10 NOTE — Progress Notes (Signed)
Summary: call a nurse   Phone Note Call from Patient   Summary of Call: Triage Record Num: [id number] Operator: Aundra Millet Patient Name: [name] Call Date & Time: 04/26/2010 8:23:21PM Patient Phone: [phone] PCP: Patient Gender: Male PCP Fax : Patient DOB: 1920-07-29 Practice Name: [facility] Reason for Call: RN returned call to pt who had called with concerns of cellulitis and pt was already at [facility] waiting to be seen for right leg reddness and swelling. Pt stated that he has had cellulitis in right leg in the past with questionable osteomyelitis . Rn advised to stay at [facility] to be seen and FU with office. Protocol(s) Used: Office Note Recommended Outcome per Protocol: Information Noted and Sent to Office Reason for Outcome: Caller information to office Care Advice:  ~ 08/ Initial call taken by: Melody Comas,  April 29, 2010 8:27 AM

## 2010-10-10 NOTE — Assessment & Plan Note (Signed)
Summary: 6 MONTH FOLLOWUP/[facility]   Vital Signs:  Patient profile:   77 year old male Height:      63.75 inches Weight:      157.25 pounds BMI:     27.30 Temp:     97.4 degrees F oral Pulse rate:   72 / minute Pulse rhythm:   regular BP sitting:   118 / 64  (left arm) Cuff size:   regular  Vitals Entered By: Lewanda Rife LPN (July 18, 2010 8:14 AM) CC: six month f/u   History of Present Illness: here for f/u of cholesterol and other chronic health problems   is feeling pretty good overall  hurt his back 2 wk ago -- riding mower to avoid a branch  is gradually getting better -- does not feel like a fracture   bp is good 118/64 today-- is on generic mavik  (cardiol inc that recently )  wt is up 3lb-- was wearing heavy jacket  is loosing height as well    tsh last normal   urol f/u -- had that appt about a month ago  could not give ua -- was given cipro  has to give specimen at home he had cystoscopy- some bladder neck obst from prev surgery-- was able to get past it  all was ok  will see in jan to check urine   cholesterol-- still on simvastain  stays away from junk food   pos heme stool-- may  did not end up seeing GI doctor  unsure if he wants to f/u with this will consider it and call us back   Allergies: 1)  * Hctz  Past History:  Past Surgical History: Last updated: 01-24-10 prostate surgery 1998 herniated disc--L3-4--1990  inguinal hernia R --1979 Tonsillectomy Prostate cancer surgery Stress test- cardiolite negative (2004) Abd [place]- normal (2004) Colonoscopy- polyps, diverticulitis,hemmorhoids (01/2003) Carotid doppler 0-39% bilateral (07/2004) Dexa- normal (12/1999) 4/11- hosp for non cardiac CP- ? 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 Eyes:  Denies blurring and eye irritation. CV:  Denies chest pain or discomfort, lightheadness, palpitations, and shortness of breath with exertion. Resp:  Denies cough, shortness of breath, and wheezing. GI:  Denies abdominal pain, change in bowel habits, and nausea. MS:  Complains of low back pain and stiffness; denies muscle aches, cramps, and muscle weakness. Derm:  Denies lesion(s), poor wound healing, and rash. Neuro:  Denies numbness and tingling. Psych:  mood is ok . Endo:  Denies cold intolerance, excessive thirst, excessive urination, and heat intolerance. Heme:  Denies abnormal bruising and bleeding.  Physical Exam  General:  elderly male in no distress  Head:  normocephalic, atraumatic, and no abnormalities observed.   Eyes:  vision grossly intact, pupils equal, pupils round, and pupils reactive to light.  Mouth:  pharynx pink and moist.   Neck:  supple with full rom and no masses or thyromegally, no JVD or carotid bruit  Chest Wall:  No deformities, masses, tenderness or gynecomastia noted. Lungs:  normal respiratory effort, no intercostal retractions, no accessory muscle use, normal breath sounds, no crackles, and no wheezes.   Heart:  Normal rate and regular rhythm. S1 and S2 normal without gallop, murmur, click, rub or other extra sounds. Abdomen:  Bowel sounds positive,abdomen soft and non-tender without masses, organomegaly or hernias noted. no  renal bruits  Msk:  no bony ls tenderness some perispinal muscle tightness neg slr  nl gait  Pulses:  plus one pedal pulses  Extremities:  no cce Neurologic:  sensation intact to light touch, gait normal, and DTRs symmetrical and normal.   Skin:  Intact without suspicious lesions or rashes Cervical Nodes:  No lymphadenopathy noted Inguinal Nodes:  No significant adenopathy Psych:  normal affect, talkative and pleasant    Impression & Recommendations:  Problem # 1:  KYPHOSIS (ICD-737.10) Assessment Deteriorated with hx of comp fx will schedule dexa disc ca and D briefly  urged to stay active safety disc will likely need bone building tx Orders: Radiology Referral (Radiology)  Problem # 2:  FECAL OCCULT BLOOD (ICD-792.1) Assessment: Unchanged we rev immunoassay card pt unsure if he wants to f/u on this (at his age -- may not be interested in cancer tx if he had it) will consider that and call me back if he wants to re sched gi appt  will check cbc today also  Problem # 3:  ANEMIA, MILD (ICD-285.9) Assessment: Improved  was resolved last check balanced diet  check cbc  Orders: TLB-CBC Platelet - w/Differential (85025-CBCD)  Hgb: 15.1 (05/01/2010)   Hct: 43.9 (05/01/2010)   Platelets: 292.0 (05/01/2010) RBC: 4.49 (05/01/2010)   RDW: 15.6 (05/01/2010)   WBC: 8.6 (05/01/2010) MCV: 97.9 (05/01/2010)   MCHC: 34.5 (05/01/2010) TSH: 4.86 (05/01/2010)  Problem # 4:  NEOPLASM, MALIGNANT, PROSTATE, HX OF (ICD-V10.46) Assessment: Unchanged continues f/u with Dr Aldean Ast for bladder neck obst  will send for last note   Problem # 5:  HYPERTENSION (ICD-401.9) Assessment: Improved  good control with current med also followed by cardiol lab today His updated medication list for this problem includes:    Trandolapril 2 Mg Tabs (Trandolapril) ..... One and 1/2 tablet daily    Trandolapril 1 Mg Tabs (Trandolapril) .Marland Kitchen... Take one daily along with 2 mg tablet for a total of 3 mg  a day  Orders: Venipuncture (16109) TLB-Lipid Panel (80061-LIPID) TLB-Renal Function Panel (80069-RENAL) TLB-ALT (SGPT) (84460-ALT) TLB-AST (SGOT) (84450-SGOT) TLB-CBC Platelet - w/Differential (85025-CBCD)  BP today: 118/64 Prior BP: 130/80 (05/22/2010)  Labs Reviewed: K+: 4.4 (05/01/2010) Creat: : 0.9 (05/01/2010)   Chol: 103 (01/16/2010)   HDL: 32.50 (01/16/2010)   LDL: 54 (01/16/2010)   TG: 81.0 (01/16/2010)  Complete Medication List: 1)  Adult Aspirin Ec Low Strength 81 Mg Tbec (Aspirin) .... Take 1 tablet by mouth once a day 2)  Eq Loratadine 10 Mg Tabs (Loratadine) .... Take 1 tablet by mouth once a day as needed 3)  Trandolapril 2 Mg Tabs (Trandolapril) .... One and 1/2 tablet daily 4)  Vesicare 10 Mg Tabs (Solifenacin succinate) .... One daily 5)  Selenium 200 Mcg Tabs (Selenium) .Marland Kitchen.. 1 podaily 6)  Trandolapril 1 Mg Tabs (Trandolapril) .... Take one daily along with 2 mg tablet for a total of 3 mg a day 7)  Zocor 20 Mg  Tabs (Simvastatin) .Marland Kitchen.. 1 by mouth once daily  Patient Instructions: 1)  please send for last urology note  2)  think about whether you want to follow up with GI for positive stool screening card- call back and let me know  3)  checking cholesterol today 4)  we will schedule bone density test at check out    Orders Added: 1)  Venipuncture [36415] 2)  TLB-Lipid Panel [80061-LIPID] 3)  TLB-Renal Function Panel [80069-RENAL] 4)  TLB-ALT (SGPT) [84460-ALT] 5)  TLB-AST (SGOT) [84450-SGOT] 6)  TLB-CBC Platelet - w/Differential [85025-CBCD] 7)  Radiology Referral [Radiology] 8)  Est. Patient Level IV [16109]    Current Allergies (reviewed today): * HCTZ

## 2010-10-10 NOTE — Assessment & Plan Note (Signed)
Summary: FOLLOW UP AFTER LABS PER DR Koraline Phillipson/[place]   Vital Signs:  Patient profile:   77 year old male Height:      63.75 inches Weight:      156 pounds BMI:     27.09 Temp:     97.4 degrees F oral Pulse rate:   64 / minute Pulse rhythm:   regular BP sitting:   128 / 68  (left arm) Cuff size:   regular  Vitals Entered By: Lewanda Rife LPN (Jan 23, 2010 10:26 AM) CC: f/u after labs   History of Present Illness: here for f/u of labs   lipids good with trig 81/ and HDL 32 and LDL 54 gluc 101  sodium was a bit low at 131-- this was re checked by cardiol 133 (only slt low ) water intake is not excessive - per pt   hb 12.8-- very slt low -- does not eat a lot of iron rich foods no GI symptoms or blood in stool   K was initially high 5.2 on first check- then was nl at 4.7 at repeat  was eating a lot of high K foods like oj and cantelope  is on ace - no change in that  tsh slt high at 5.75 has been sluggish  appetite is down lately  no wt gain  a little below par   thinks he can exercise a bit more  tries to eat fish    Allergies: 1)  * Hctz  Past History:  Past Medical History: Last updated: 02-09-10 Hypertension carotid artery stenosis  hyperlipidemia  hx of prostate ca  degenerative disc dz     cardiol- Dr Antoine Poche urology -- Dr Aldean Ast   Past Surgical History: Last updated: 09-Feb-2010 prostate surgery 1998 herniated disc--L3-4--1990  inguinal hernia R --1979 Tonsillectomy Prostate cancer surgery Stress test- cardiolite negative (2004) Abd [place]- normal (2004) Colonoscopy- polyps, diverticulitis,hemmorhoids (01/2003) Carotid doppler 0-39% bilateral (07/2004) Dexa- normal (12/1999) 4/11- hosp for non cardiac CP- ? 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 Eyes:  Denies blurring. CV:  Denies chest pain or discomfort, lightheadness, palpitations, shortness of breath with exertion, and swelling of feet. Resp:  Denies cough, shortness of breath, and wheezing. GI:  Denies abdominal pain, bloody stools, change in bowel habits, indigestion, nausea, and vomiting. GU:  Denies hematuria. MS:  Complains of stiffness; denies joint pain, cramps, and muscle weakness. Derm:  Denies lesion(s), poor wound healing, and rash. Neuro:  Denies falling down, headaches, numbness, and tingling. Psych:  Denies anxiety and depression. Endo:  Denies cold intolerance, excessive hunger, excessive thirst, excessive urination, and heat intolerance. Heme:  Denies abnormal bruising and bleeding.  Physical Exam  General:  somewhat frail elderly male with some kyphosis Head:  normocephalic, atraumatic, and no abnormalities observed.   Eyes:  vision grossly intact, pupils equal, pupils round, and pupils reactive to light.  no conjunctival pallor, injection or icterus  Mouth:  pharynx pink and moist.   Neck:  Neck supple, no JVD . No masses, thyromegaly or abnormal cervical nodes. Lungs:  normal respiratory effort, no intercostal retractions, no accessory muscle use, normal breath sounds, no crackles, and no wheezes.   Heart:  Normal rate and regular rhythm. S1 and S2 normal without gallop, murmur, click, rub or other extra sounds. Abdomen:  Bowel sounds positive,abdomen soft and non-tender without masses, organomegaly or hernias noted. no renal bruits  Msk:  kyphosis- mild no cva  tenderness  Extremities:  No clubbing or cyanosis. Neurologic:  gait normal and DTRs symmetrical and normal. no tremor   Skin:  Intact without suspicious lesions or rashes no pallor or jaundice  Cervical Nodes:  No lymphadenopathy noted Inguinal Nodes:  No significant adenopathy Psych:  nl affect    Impression & Recommendations:  Problem # 1:  THYROID STIMULATING HORMONE, ABNORMAL (ICD-246.9) Assessment New will check thyroid panel today and tx for hypothyroidism if needed ? if poss reason for mild low na  some fatigue  update with result and plan Orders: Venipuncture (16109) TLB-TSH (Thyroid Stimulating Hormone) (84443-TSH) TLB-T4 (Thyrox), Free (60454-UJ8J) TLB-T3 Uptake (19147-W2NF)  Problem # 2:  HYPERTENSION (ICD-401.9) Assessment: Unchanged  bp in great control  K came back nl - adv to cut down on cantelope no  changes needed His updated medication list for this problem includes:    Trandolapril 2 Mg Tabs (Trandolapril) ..... One by mouth once daily    Trandolapril 1 Mg Tabs (Trandolapril) .Marland Kitchen... Take one daily along with 2 mg tablet for a total of 3 mg a day  BP today: 128/68 Prior BP: 156/74 (01/16/2010)  Labs Reviewed: K+: 4.7 (01/21/2010) Creat: : 0.9 (01/21/2010)   Chol: 103 (01/16/2010)   HDL: 32.50 (01/16/2010)   LDL: 54 (01/16/2010)   TG: 81.0 (01/16/2010)  Problem # 3:  HYPERCHOLESTEROLEMIA (ICD-272.0) Assessment: Unchanged  great control -- need to inc hdl with exercise and omega 3 rev this in detail with pt  His updated medication list for this problem includes:    Zocor 20 Mg Tabs (Simvastatin) .Marland Kitchen... Take 1 tablet by mouth every night  Labs Reviewed: SGOT: 25 (02/21/2009)   SGPT: 20 (02/21/2009)   HDL:32.50 (01/16/2010), 39.00 (02/21/2009)  LDL:54 (01/16/2010), 79 (02/21/2009)  Chol:103 (01/16/2010), 128 (02/21/2009)  Trig:81.0 (01/16/2010), 49.0 (02/21/2009)  Problem # 4:  ANEMIA, MILD (ICD-285.9) Assessment: New could be age or chronic dz  related will check stool immunoassay card has stopped ibuprofen also  no GI symptoms   Problem # 5:  HYPONATREMIA (ICD-276.1) Assessment: New very mild - warrants obs ? if thyroid rel  not a big water drinker no symptoms  imp on 2nd test  Complete Medication List: 1)  Zocor 20 Mg Tabs (Simvastatin) .... Take 1 tablet by mouth every night 2)  Adult Aspirin Ec Low Strength 81 Mg Tbec (Aspirin) .... Take 1 tablet by mouth once a day 3)  Eq Loratadine 10 Mg Tabs (Loratadine) .... Take 1 tablet by mouth once a day 4)  Vit E 200 Iu  .... Take 1 tablet by mouth once a day 5)  Trandolapril 2 Mg Tabs (Trandolapril) .... One by mouth once daily 6)  Vesicare 10 Mg Tabs (Solifenacin succinate) .... One daily 7)  Selenium 200 Mcg Tabs (Selenium) .Marland Kitchen.. 1 podaily 8)  Trandolapril 1 Mg Tabs (Trandolapril) .... Take one daily along with 2 mg tablet for a total of 3 mg a day  Patient Instructions: 1)  thyroid labs today  2)  please do stool card  3)  will update with a plan when labs return  Current Allergies (reviewed today): * HCTZ

## 2010-10-10 NOTE — Progress Notes (Signed)
Summary: simvastatin  Phone Note Call from Patient Call back at Home Phone [phone]   Caller: Patient Call For: dr Zavon Hyson Summary of Call: will you print another 90 day script for his simvastatin- he says he didn't get the last one and I can't find it up front Initial call taken by: Lowella Petties,  January 03, 2008 4:26 PM  Follow-up for Phone Call        advised pt ok to pick up Follow-up by: Lowella Petties,  January 03, 2008 5:15 PM      Prescriptions: ZOCOR 20 MG TABS (SIMVASTATIN) Take 1 tablet by mouth every night  #90 x 3   Entered and Authorized by:   Judith Part MD   Signed by:   Judith Part MD on 01/03/2008   Method used:   Print then Give to Patient   RxID:   [id number]

## 2010-10-10 NOTE — Assessment & Plan Note (Signed)
Summary: FOLLOW UP vITAMIN B 12 PER DR Macarius Ruark/[place]   Vital Signs:  Patient profile:   77 year old male Height:      63.75 inches Weight:      156 pounds BMI:     27.09 Temp:     97.3 degrees F oral Pulse rate:   72 / minute Pulse rhythm:   regular BP sitting:   118 / 60  (left arm) Cuff size:   regular  Vitals Entered By: Lewanda Rife LPN (August 27, 2010 9:08 AM) CC: follow-up visit vitamin b12   History of Present Illness: here for f/u of B12  had 4 shots in 4 weeks to see if this would help macrocytosis ) thinks he has more energy with it   does not fall asleep as quickly  does eat a pretty balanced diet  ? age related problem    does occ take antacids - mylanta -- every evening  this helps gas  no heartburn   2 drinks per day alcohol  could cut that if he needed to    Allergies: 1)  * Hctz  Past History:  Past Medical History: Last updated: 07/18/2010 Hypertension carotid artery stenosis  hyperlipidemia  hx of prostate ca  degenerative disc dz HTN kyphosis  compression fractures      cardiol- Dr Antoine Poche urology -- Dr Aldean Ast   Past Surgical History: Last updated: February 06, 2010 prostate surgery 1998 herniated disc--L3-4--1990  inguinal hernia R --1979 Tonsillectomy Prostate cancer surgery Stress test- cardiolite negative (2004) Abd [place]- normal (2004) Colonoscopy- polyps, diverticulitis,hemmorhoids (01/2003) Carotid doppler 0-39% bilateral (07/2004) Dexa- normal (12/1999) 4/11- hosp for non cardiac CP- ? 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 Eyes:  Denies blurring and eye irritation. CV:  Denies chest pain or discomfort and lightheadness. Resp:  Denies cough and wheezing. GI:  Complains of indigestion; denies abdominal pain, change in bowel habits, and nausea. GU:  Denies urinary frequency. MS:  Denies joint redness and joint swelling. Derm:  Denies lesion(s), poor wound healing, and rash. Neuro:  Denies numbness and tingling. Psych:  mood is ok . Endo:  Denies cold intolerance, excessive thirst, excessive urination, and heat intolerance. Heme:  Denies abnormal bruising and bleeding.  Physical Exam  General:  elderly male in no distress  Head:  normocephalic, atraumatic, and no abnormalities observed.   Eyes:  vision grossly intact, pupils equal, pupils round, and pupils reactive to light.  no conjunctival pallor, injection or icterus  Mouth:  pharynx pink and moist.   Neck:  supple with full rom and no masses or thyromegally, no JVD or carotid bruit  Chest Wall:  No deformities, masses, tenderness or gynecomastia noted. Lungs:  normal respiratory effort, no intercostal retractions, no accessory muscle use, normal breath sounds, no crackles, and no wheezes.   Heart:  Normal rate and regular rhythm. S1 and S2 normal without gallop, murmur, click, rub or other extra sounds. Abdomen:  Bowel sounds positive,abdomen soft and non-tender without masses, organomegaly  or hernias noted. no renal bruits  Msk:  No deformity or scoliosis noted of thoracic or lumbar spine.   Extremities:  No clubbing, cyanosis, edema, or deformity noted with normal full range of motion of all joints.   Neurologic:  sensation intact to light touch, gait normal, and DTRs symmetrical and normal.  no tremor  Skin:  some dry skin over prev site of cellulitis on ankle  irritated 2 cm area  Cervical Nodes:  No lymphadenopathy  noted Psych:  normal affect, talkative and pleasant    Impression & Recommendations:  Problem # 1:  MACROCYTIC ANEMIA (ICD-281.9) Assessment Unchanged unsure if from B12 or alcohol or other  re check today after B12 suppl and then will adv further disc cutting alcohol if not improved   His updated medication list for this problem includes:    Cyanocobalamin 1000 Mcg/ml Soln (Cyanocobalamin) ..... One ml im given weekly x 4 weeks then f/u with dr Tiarah Shisler.  Orders: Venipuncture (16109) TLB-CBC Platelet - w/Differential (85025-CBCD) TLB-B12 + Folate Pnl (60454_09811-B14/NWG)  Complete Medication List: 1)  Adult Aspirin Ec Low Strength 81 Mg Tbec (Aspirin) .... Take 1 tablet by mouth once a day 2)  Eq Loratadine 10 Mg Tabs (Loratadine) .... Take 1 tablet by mouth once a day as needed 3)  Trandolapril 2 Mg Tabs (Trandolapril) .... One and 1/2 tablet daily 4)  Vesicare 10 Mg Tabs (Solifenacin succinate) .... One daily 5)  Selenium 200 Mcg Tabs (Selenium) .Marland Kitchen.. 1 podaily 6)  Trandolapril 1 Mg Tabs (Trandolapril) .... Take one daily along with 2 mg tablet for a total of 3 mg a day 7)  Zocor 20 Mg Tabs (Simvastatin) .Marland Kitchen.. 1 by mouth once daily 8)  Cyanocobalamin 1000 Mcg/ml Soln (Cyanocobalamin) .... One ml im given weekly x 4 weeks then f/u with dr Adeli Frost.  Patient Instructions: 1)  checking B12 today and then I will update you further  2)  I may ask you to cut alcohol consumption    Orders Added: 1)  Venipuncture [36415] 2)  TLB-CBC Platelet - w/Differential [85025-CBCD] 3)  TLB-B12 + Folate Pnl [[id number]-B12/FOL] 4)  Est. Patient Level III [95621]    Current Allergies (reviewed today): * HCTZ

## 2010-10-10 NOTE — Progress Notes (Signed)
Summary: test result   Phone Note Call from Patient Call back at Home Phone [phone]   Caller: Patient Reason for Call: Talk to Nurse, Lab or Test Results Details for Reason: cartoid Initial call taken by: Lorne Skeens,  March 25, 2010 8:57 AM  Follow-up for Phone Call        reviewed carotid doppler results with pt.  He will repeat testing in 2 yrs as ordered Follow-up by: Charolotte Capuchin, RN,  March 25, 2010 10:04 AM

## 2010-10-10 NOTE — Assessment & Plan Note (Signed)
Summary: HOSPITAL FOLLOW UP/ lb   Vital Signs:  Patient profile:   77 year old male Height:      63.75 inches Weight:      157.50 pounds BMI:     27.35 Temp:     97.4 degrees F oral Pulse rate:   64 / minute Pulse rhythm:   regular BP sitting:   156 / 74  (left arm) Cuff size:   regular  Vitals Entered By: Lewanda Rife LPN (2010-02-08 12:02 PM)  Serial Vital Signs/Assessments:  Time      Position  BP       Pulse  Resp  Temp     By                     138/70                         Judith Part MD  CC: check up and f/u after hospitalization 2 weeks ago at [facility], Back Pain   History of Present Illness: here for f/u  he is new to me today from [place] -- here to est for care and for hosp f/u  pt has hx of brief hosp in april for cp thought to be non cardiac  was tx with analgesics/ nsaid for poss MSK pain  had brief mild wbc bump and neg D dimer and saw Dr Jens Som / cardiol in hosp  there was a question of pericarditis ? - with pain on deep breath    has had several f/u with Dr Antoine Poche since then adv to stop the nsaid -- is weaning off of it-- has essentially stopped   he did inc his bp med due to high bp -- tradolopril   has hx of carotid artery stenosis - to be checked soon for his mt doppler on monday   hx of HTN---156/74 today - is high today   hx of prostate ca -- prostate was removed   hx of chol - takes simvastatin - and due for refil appetite has not been as good -- perhaps from the ibuprofen   chest pain is better   last labs in june - is just about due   due see urologist in 6 months have frequency -- this is baseline  wants to check urine today     Allergies: 1)  * Hctz  Past History:  Family History: Last updated: February 08, 2010 Father: HTN Mother: died young from nephritis no cancer in family   Social History: Last updated: 2010/02/08 Marital Status: Married Occupation: retired, 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, volunteers alcohol-  2  drinks per day  non smoker  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   Risk Factors: Alcohol Use: 1 (02/21/2009) Caffeine Use: 0 (02/21/2009) Exercise: yes (02/21/2009)  Risk Factors: Smoking Status: never (02/21/2009)  Past Medical History: Hypertension carotid artery stenosis  hyperlipidemia  hx of prostate ca  degenerative disc dz     cardiol- Dr Antoine Poche urology -- Dr Aldean Ast   Past Surgical History: prostate surgery 1998 herniated disc--L3-4--1990  inguinal hernia R --1979 Tonsillectomy Prostate cancer surgery Stress test- cardiolite negative (2004) Abd [place]- normal (2004) Colonoscopy- polyps, diverticulitis,hemmorhoids (01/2003) Carotid doppler 0-39% bilateral (07/2004) Dexa- normal (12/1999) 4/11- hosp for non cardiac CP- ? 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 Eyes:  Denies blurring and eye pain. CV:  Denies chest pain or discomfort and lightheadness. Resp:  Denies cough, pleuritic, shortness of breath, sputum productive, and wheezing. GI:  Denies abdominal pain, change in bowel habits, indigestion, and nausea. GU:  Complains of urinary frequency; denies dysuria, hematuria, and urinary hesitancy. MS:  Denies joint pain, joint redness, joint swelling, and stiffness. Derm:  Denies itching, lesion(s), poor wound healing, and rash. Neuro:  Denies numbness and tingling. Psych:  Denies anxiety and depression. Endo:  Denies cold intolerance, excessive thirst, excessive urination, and heat intolerance. Heme:  Denies abnormal bruising and bleeding.  Physical Exam  General:  somewhat frail elderly male with some  kyphosis Head:  normocephalic, atraumatic, and no abnormalities observed.   Eyes:  vision grossly intact, pupils equal, pupils round, and pupils reactive to light.  no conjunctival pallor, injection or icterus  Mouth:  pharynx pink and moist.   Neck:  Neck supple, no JVD . No masses, thyromegaly or abnormal cervical nodes. Chest Wall:  No deformities, masses, tenderness or gynecomastia noted. Lungs:  normal respiratory effort, no intercostal retractions, no accessory muscle use, normal breath sounds, no crackles, and no wheezes.   Heart:  Normal rate and regular rhythm. S1 and S2 normal without gallop, murmur, click, rub or other extra sounds. Abdomen:  Bowel sounds positive,abdomen soft and non-tender without masses, organomegaly or hernias noted. no suprapubic tenderness or fullness felt  Msk:  kyphosis- mild no cva tenderness  Pulses:  plus one pedal pulses  Extremities:  No clubbing or cyanosis. Neurologic:  sensation intact to light touch, gait normal, and DTRs symmetrical and normal.   Skin:  fair without pallor  Cervical Nodes:  No lymphadenopathy noted Inguinal Nodes:  No significant adenopathy Psych:  nl affect - mildly anxious today   Impression & Recommendations:  Problem # 1:  CHEST PAIN (ICD-786.50) Assessment Improved this is resolved and thought to be inflammatory  adv to stop his ibuprofen  rev cardiol notes  rev hosp notes in detail as well with pt  Problem # 2:  CAROTID ARTERY STENOSIS (ICD-433.10) Assessment: Unchanged asymtomatic - for doppler and cardiol f/u soon  continue asa and aggresive HTN and  chol control  His updated medication list for this problem includes:    Adult Aspirin Ec Low Strength 81 Mg Tbec (Aspirin) .Marland Kitchen... Take 1 tablet by mouth once a day  Problem # 3:  HYPERTENSION (ICD-401.9) Assessment: Improved  bp is better on second check today after inc in his med  will continue to watch  px done f/u 6 mo  lab today His updated medication  list for this problem includes:    Trandolapril 2 Mg Tabs (Trandolapril) ..... One by mouth once daily    Trandolapril 1 Mg Tabs (Trandolapril) .Marland Kitchen... Take one daily along with 2 mg tablet for a total of 3 mg a day  Orders: Venipuncture (04540) TLB-Lipid Panel (80061-LIPID) TLB-Renal Function Panel (80069-RENAL) TLB-CBC Platelet - w/Differential (85025-CBCD) TLB-TSH (Thyroid Stimulating Hormone) (84443-TSH)  BP today: 156/74-- re check 135/70 Prior BP: 154/70 (01/08/2010)  Labs Reviewed: K+: 4.3 (02/21/2009) Creat: : 0.9 (02/21/2009)   Chol: 128 (02/21/2009)   HDL: 39.00 (02/21/2009)   LDL: 79 (02/21/2009)   TG: 49.0 (02/21/2009)  Problem # 4:  HYPERCHOLESTEROLEMIA (ICD-272.0) Assessment: Unchanged  on simvastatin lab today rev low sat fat diet and reasons for tx  f/u 6 mo  His updated medication list for this problem includes:    Zocor 20 Mg Tabs (Simvastatin) .Marland Kitchen... Take 1 tablet by mouth every night  Orders: Venipuncture (16109) TLB-Lipid Panel (80061-LIPID) TLB-Renal Function Panel (80069-RENAL) TLB-CBC Platelet - w/Differential (85025-CBCD) TLB-TSH (Thyroid Stimulating Hormone) (84443-TSH)  Labs Reviewed: SGOT: 25 (02/21/2009)   SGPT: 20 (02/21/2009)   HDL:39.00 (02/21/2009)  LDL:79 (02/21/2009)  Chol:128 (02/21/2009)  Trig:49.0 (02/21/2009)  Problem # 5:  URINARY FREQUENCY (ICD-788.41) Assessment: New per pt this is chronic  no other symptoms  has hx of prostate ca with prostatectomy has been over a year since urol f/u- will sched for him ua today  His updated medication list for this problem includes:    Vesicare 10 Mg Tabs (Solifenacin succinate) ..... One daily  Orders: Urology Referral (Urology) UA Dipstick W/ Micro (manual) (60454)  Complete Medication List: 1)  Zocor 20 Mg Tabs (Simvastatin) .... Take 1 tablet by mouth every night 2)  Adult Aspirin Ec Low Strength 81 Mg Tbec (Aspirin) .... Take 1 tablet by mouth once a day 3)  Eq Loratadine 10 Mg Tabs  (Loratadine) .... Take 1 tablet by mouth once a day 4)  Vit E 200 Iu  .... Take 1 tablet by mouth once a day 5)  Trandolapril 2 Mg Tabs (Trandolapril) .... One by mouth once daily 6)  Vesicare 10 Mg Tabs (Solifenacin succinate) .... One daily 7)  Selenium 200 Mcg Tabs (Selenium) .Marland Kitchen.. 1 podaily 8)  Trandolapril 1 Mg Tabs (Trandolapril) .... Take one daily along with 2 mg tablet for a total of 3 mg a day  Patient Instructions: 1)  stop the ibuprofen -this can raise blood pressure  2)  labs today  3)  here are px to mail to your pharmacy  4)  try to eat healthy and stay active  5)  follow up in about 6 months  6)  you are due for yearly visit to Dr Aldean Ast -- we wil do referral at check out  Prescriptions: TRANDOLAPRIL 1 MG TABS (TRANDOLAPRIL) take one daily along with 2 mg tablet for a total of 3 mg a day  #90 x 3   Entered and Authorized by:   Judith Part MD   Signed by:   Judith Part MD on 01/16/2010   Method used:   Print then Give to Patient   RxID:   [id number] TRANDOLAPRIL 2 MG TABS (TRANDOLAPRIL) one by mouth once daily  #90 x 3   Entered and Authorized by:   Judith Part MD   Signed by:   Judith Part MD on 01/16/2010   Method used:   Print then Give to Patient   RxID:   [id number] ZOCOR 20 MG TABS (SIMVASTATIN) Take 1 tablet by mouth every night  #90 x 3   Entered and Authorized by:   Judith Part MD   Signed by:   Judith Part MD on 01/16/2010   Method used:   Print then Give to Patient   RxID:   [id number]   Current Allergies (reviewed today): * HCTZ  Laboratory Results   Urine Tests  Date/Time Received: Jan 16, 2010 12:48 PM  Date/Time Reported: Jan 16, 2010 12:48 PM   Routine Urinalysis   Color: yellow Appearance: Clear Glucose: negative   (Normal Range: Negative) Bilirubin: negative   (  Normal Range: Negative) Ketone: negative   (Normal Range: Negative) Spec. Gravity: 1.010   (Normal Range: 1.003-1.035) Blood:  negative   (Normal Range: Negative) pH: 8.0   (Normal Range: 5.0-8.0) Protein: negative   (Normal Range: Negative) Urobilinogen: 0.2   (Normal Range: 0-1) Nitrite: negative   (Normal Range: Negative) Leukocyte Esterace: negative   (Normal Range: Negative)  Urine Microscopic WBC/HPF: 0-1 RBC/HPF: 0 Bacteria/HPF: few Mucous/HPF: many Epithelial/HPF: 3-4 Crystals/HPF: few Casts/LPF: 0 Yeast/HPF: 0 Other: 0        Appended Document: HOSPITAL FOLLOW UP/ lb Rena- please let pt know urine looked clear to me- no signs of infection  Appended Document: HOSPITAL FOLLOW UP/ lb Patient notified as instructed by telephone.

## 2010-10-10 NOTE — Assessment & Plan Note (Signed)
Summary: ? TICK BITE/ 2:30   Vital Signs:  Patient profile:   77 year old male Height:      63.75 inches Weight:      154.25 pounds BMI:     26.78 Temp:     97.5 degrees F oral Pulse rate:   68 / minute Pulse rhythm:   regular BP sitting:   130 / 80  (left arm) Cuff size:   regular  Vitals Entered By: Delilah Shan CMA Charisse Wendell Dull) (May 22, 2010 2:34 PM) CC: ? tick bite   History of Present Illness: H/o tick bites, prev removed in the office.  Was on doxy until a few days ago.  Doing well until likely tick found attached on L post upper thigh.  Found last night.  Wife attempted to remove it.  Here today for eval.  No FCNAV.  Feeling well o/w.   Allergies: 1)  * Hctz  Review of Systems       See HPI.  Otherwise negative.    Physical Exam  General:  NAD Proximal L post thigh with <1cm area of erythema but no flutuant mass.  No discharge.  Central epithelial disruption.  No tick visualized under magnification.    Impression & Recommendations:  Problem # 1:  TICK BITE (ICD-E906.4) I think tick was likely removed.  D/w patient EA:[id number] measures and will tx again with doxy.  follow up as needed.  he agrees.  Okay for outpatient follow up.   Complete Medication List: 1)  Adult Aspirin Ec Low Strength 81 Mg Tbec (Aspirin) .... Take 1 tablet by mouth once a day 2)  Eq Loratadine 10 Mg Tabs (Loratadine) .... Take 1 tablet by mouth once a day as needed 3)  Trandolapril 2 Mg Tabs (Trandolapril) .... One and 1/2 tablet daily 4)  Vesicare 10 Mg Tabs (Solifenacin succinate) .... One daily 5)  Selenium 200 Mcg Tabs (Selenium) .Marland Kitchen.. 1 podaily 6)  Trandolapril 1 Mg Tabs (Trandolapril) .... Take one daily along with 2 mg tablet for a total of 3 mg a day 7)  Doxycycline Hyclate 100 Mg Caps (Doxycycline hyclate) .Marland Kitchen.. 1 by mouth two times a day x10d  Patient Instructions: 1)  Use the DEET spray on your clothes, especially your ankles and waistband.  Check your animals for ticks.  Take the  doxycycline two times a day and let me know if you have any fevers or if the area doesn't gradually heal.  Take care.  Glad to see you.  Prescriptions: DOXYCYCLINE HYCLATE 100 MG CAPS (DOXYCYCLINE HYCLATE) 1 by mouth two times a day x10d  #20 x 0   Entered and Authorized by:   Crawford Givens MD   Signed by:   Crawford Givens MD on 05/22/2010   Method used:   Electronically to        [facility]  [address] (retail)       [address]       [place], [place]  [address]       Ph: [phone] or [phone]       Fax: [phone]   RxID:   [id number]   Current Allergies (reviewed today): * HCTZ

## 2010-10-10 NOTE — Progress Notes (Signed)
Summary: pt having chest pain   Phone Note Call from Patient   Caller: Spouse Reason for Call: Talk to Nurse, Talk to Doctor Summary of Call: pt having chest pain and says it hurts when he breaths Initial call taken by: Omer Jack,  December 07, 2009 3:30 PM  Follow-up for Phone Call        Spoke with pt. patient C/O pain  heaviness and pressure on chest. Patient states it gets worse whe he takes a deep breath pain score goes to 7- 8  it is 4 to 5 when he breaths normal. Also pt c/o of having indigestion. Pt. took 2 baby asprin and it helped the pain. Pt's B/P 164/82  and 171/102.  I adviced pt. needs to go to [facility] ER due of active CP and other symptoms. Pt. agreed.Daun Peacock notified. ER notification Faxed to [facility] ER. Ollen Gross, RN, BSN  December 07, 2009 3:54 PM

## 2010-10-10 NOTE — Progress Notes (Signed)
Summary: leg pain   Phone Note Call from Patient Call back at Home Phone [phone]   Caller: Patient Reason for Call: Talk to Nurse Summary of Call: pt c/o pain in his legs starting in the hip area to the back on the leg Initial call taken by: Edman Circle,  November 09, 2009 1:07 PM  Follow-up for Phone Call        Spoke with pt. patient states he is having pain starting in  his right hip to the back of  his leg. He drove for two hrs yesterday. Patient also states he has been reading that [place] williams the tennis star had a pulmonary embolus. Pt states her symptoms statared in her leg and it traveled  to her lungs. He wants to make sure it is not what is going on with him. Pt. denies having a localized pain, redness, swelling nor worm area. RN let pt. know it is possible because of the long drive his leg could be sore.  I advse pt.to make an appointment with his PCP to make sure what is going on if he is concern. Pt. verbalized understanding.  Ollen Gross, RN, BSN  November 09, 2009 1:51 PM

## 2010-10-10 NOTE — Assessment & Plan Note (Signed)
Summary: Renew medicine   Vital Signs:  Patient profile:   77 year old male Height:      63.75 inches Weight:      157 pounds Temp:     97.8 degrees F oral Pulse rate:   64 / minute Pulse rhythm:   regular BP sitting:   150 / 78  (left arm) Cuff size:   regular  Vitals Entered By: Lewanda Rife (February 21, 2009 8:39 AM)  CC:  renew medications.  History of Present Illness: Here for refills of zocor--no labs in >3 yrs, is fasting --has not been seen here since 03/2007 --saw Dr Antoine Poche, cardio 04/2008--doing well and continued on meds as currently taking  Needs PSA--sees Dr Aldean Ast on regular basis--has had prostate canceer remotely  Was seen at [facility] x2 for attatched ticks, started on Doxycycline two times a day x7d the first time and for several more days the second time.  No signs of Lyme's at this time.  Preventive Screening-Counseling & Management  Alcohol-Tobacco     Alcohol drinks/day: 1     Alcohol type: wine, mixed      Smoking Status: never  Caffeine-Diet-Exercise     Caffeine use/day: 0     Does Patient Exercise: yes     Type of exercise: walks      Exercise (avg: min/session): <30     Times/week: 7  Problems Prior to Update: 1)  Accident Due To Hornet/wasp/bee  (ICD-E905.3)  Medications Prior to Update: 1)  Zocor 20 Mg Tabs (Simvastatin) .... Take 1 Tablet By Mouth Every Night 2)  Tarka 2-180 Mg  Tbcr (Trandolapril-Verapamil Hcl) .... Take 1 Tablet By Mouth Once A Day 3)  Adult Aspirin Ec Low Strength 81 Mg  Tbec (Aspirin) .... Take 1 Tablet By Mouth Once A Day 4)  Zocor 20 Mg  Tabs (Simvastatin) .... Take 1 Tablet By Mouth Once A Day 5)  Eq Loratadine 10 Mg  Tabs (Loratadine) .... Take 1 Tablet By Mouth Once A Day 6)  Vit E 200 Iu .... Take 1 Tablet By Mouth Once A Day 7)  Clarinex 5 Mg  Tabs (Desloratadine) .Marland Kitchen.. 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 Resp:  Denies cough, shortness of breath, and wheezing. GI:  Denies abdominal pain, change in bowel habits, constipation, diarrhea, indigestion, nausea, and vomiting; colonoscopy 51yrs ago--[facility]. GU:  See HPI. Derm:  Denies lesion(s) and rash; wears broad brimmed hat. Neuro:  Denies difficulty with concentration, disturbances in coordination, memory loss, tremors, and weakness. Psych:  Denies anxiety and depression.  Physical Exam  General:  alert, well-developed, well-nourished, and well-hydrated.  NAD Eyes:  pupils equal, pupils round, and no injection.  glasses Neck:  no masses, no thyromegaly, no JVD, and no carotid bruits.   Lungs:  normal respiratory effort, no intercostal retractions, no accessory muscle use, normal breath sounds, no crackles, and no wheezes.   Heart:  normal rate, regular rhythm, and no murmur.  Extremities:  no edema either lower leg small varicose veins both lower legs Neurologic:  sensation intact to light touch and gait normal.   Skin:  turgor normal and color normal.   has 2 eryth areas on posterior lower R leg with 2 dark centers( site of tick bites)--both clean and no drainage Psych:  normally interactive and good eye contact.     Impression & Recommendations:  Problem # 1:  HYPERCHOLESTEROLEMIA (ICD-272.0) no recent labs--will get lipids and LFTs today as well as bmet--will hold Rx until labs back encouraged to come in yearly for labs and eval The following  medications were removed from the medication list:    Zocor 20 Mg Tabs (Simvastatin) .Marland Kitchen... Take 1 tablet by mouth once a day His updated medication list for this problem includes:    Zocor 20 Mg Tabs (Simvastatin) .Marland Kitchen... Take 1 tablet by mouth every night  Orders: Venipuncture (81191) TLB-Lipid Panel (80061-LIPID) TLB-ALT (SGPT) (84460-ALT) TLB-AST (SGOT) (84450-SGOT) TLB-BMP (Basic Metabolic Panel-BMET) (80048-METABOL)  Problem # 2:  NEOPLASM, MALIGNANT, PROSTATE, HX OF (ICD-V10.46) Assessment: Comment Only is followed by Dr Aldean Ast, urology--asked for PSA to be faxed there  Orders: TLB-PSA (Prostate Specific Antigen) (84153-PSA)  Problem # 3:  TICK BITE (ICD-E906.4) Assessment: Comment Only has been [facility] remain clean and dry--follow as needed  Complete Medication List: 1)  Zocor 20 Mg Tabs (Simvastatin) .... Take 1 tablet by mouth every night 2)  Adult Aspirin Ec Low Strength 81 Mg Tbec (Aspirin) .... Take 1 tablet by mouth once a day 3)  Eq Loratadine 10 Mg Tabs (Loratadine) .... Take 1 tablet by mouth once a day 4)  Vit E 200 Iu  .... Take 1 tablet by mouth once a day 5)  Clarinex 5 Mg Tabs (Desloratadine) .Marland Kitchen.. 1 daily 6)  Trandolapril 2 Mg Tabs (Trandolapril) .... One daily 7)  Vesicare 10 Mg Tabs (Solifenacin succinate) .... One daily 8)  Selenium 100 Mcg Tabs (Selenium) .... Otc as directed  Current Allergies (reviewed today): * HCTZ

## 2010-10-10 NOTE — Assessment & Plan Note (Signed)
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

## 2010-10-10 NOTE — Assessment & Plan Note (Signed)
Summary: B-12/Jocob Dambach/JRR   Nurse Visit   Allergies: 1)  * Hctz  Medication Administration  Injection # 1:    Medication: Vit B12 1000 mcg    Diagnosis: MACROCYTIC ANEMIA (ICD-281.9)    Route: IM    Site: L deltoid    Exp Date: 01/07/2012    Lot #: [id number]    Mfr: [employer]    Patient tolerated injection without complications    Given by: Lewanda Rife LPN (August 07, 2010 5:18 PM)  Orders Added: 1)  Vit B12 1000 mcg [J3420] 2)  Admin of Therapeutic Inj  intramuscular or subcutaneous [46962]

## 2010-10-10 NOTE — Letter (Signed)
Summary: [facility]: Immunoassay Fecal Occult Blood (iFOB) Order Form  [facility]  [address] [place], [place] [address]   Phone: [phone]  Fax: [phone]      [facility]: Immunoassay Fecal Occult Blood (iFOB) Order Form   Jan 23, 2010 MRN: [id number]   [name] 10-22-1919   Physicican Name:________Tower _________________  Diagnosis Code:_____anemia 285.9, colon cancer screen_____________________      Judith Part MD

## 2010-10-10 NOTE — Progress Notes (Signed)
Summary: traveling   Phone Note Call from Patient Call back at Home Phone [phone]   Caller: Patient Reason for Call: Talk to Nurse Summary of Call: has a upcoming trip to [place], wants to know if its ok if he goes, he will not be driving Initial call taken by: Migdalia Dk,  December 10, 2009 10:32 AM  Follow-up for Phone Call        Spoke with wife.  Someone else would be driving.  Pt will be walking around a bit but understands  that if he becomes fatigued he should stop to rest.  There is no medical reason at this point that pt would not be able to travel. Follow-up by: Charolotte Capuchin, RN,  December 10, 2009 10:46 AM

## 2010-10-10 NOTE — Assessment & Plan Note (Signed)
Summary: eph/per after hrs msg/jss  Medications Added SELENIUM 200 MCG TABS (SELENIUM) 1 podaily IBUPROFEN 600 MG TABS (IBUPROFEN) as needed TRANDOLAPRIL 1 MG TABS (TRANDOLAPRIL) take one daily along with 2 mg tablet for a total of 3 mg a day      Allergies Added:   Visit Type:  Follow-up Primary Provider:  Judith Part MD  CC:  chest pain.  History of Present Illness: The patient presents for evaluation of chest discomfort. He was hospitalized briefly with this but it was felt to be non-cardiac. The possibility of pericarditis was raised with the patient though there was no clinical indication of this at echocardiography was not performed. He was sent home on ibuprofen and has been taking 600 mg q.8 hours. He has had no further chest discomfort. He's had no neck or arm discomfort. He has had no shortness of breath, PND or orthopnea. He has had no palpitations, presyncope or syncope.  Current Medications (verified): 1)  Zocor 20 Mg Tabs (Simvastatin) .... Take 1 Tablet By Mouth Every Night 2)  Adult Aspirin Ec Low Strength 81 Mg  Tbec (Aspirin) .... Take 1 Tablet By Mouth Once A Day 3)  Eq Loratadine 10 Mg  Tabs (Loratadine) .... Take 1 Tablet By Mouth Once A Day 4)  Vit E 200 Iu .... Take 1 Tablet By Mouth Once A Day 5)  Trandolapril 2 Mg Tabs (Trandolapril) .... One Daily 6)  Vesicare 10 Mg Tabs (Solifenacin Succinate) .... One Daily 7)  Selenium 200 Mcg Tabs (Selenium) .Marland Kitchen.. 1 Podaily 8)  Ibuprofen 600 Mg Tabs (Ibuprofen) .... As Needed  Allergies (verified): 1)  * Hctz  Past History:  Past Medical History: Reviewed history from 02/23/2009 and no changes required. Hypertension  Past Surgical History: Reviewed history from 02/23/2009 and no changes required. prostate surgery 1998 herniated disc--L3-4--1990  inguinal hernia R --1979 Tonsillectomy Prostate cancer surgery Stress test- cardiolite negative (2004) Abd [place]- normal (2004) Colonoscopy- polyps,  diverticulitis,hemmorhoids (01/2003) Carotid doppler 0-39% bilateral (07/2004) Dexa- normal (12/1999)  Review of Systems       As stated in the HPI and negative for all other systems.   Vital Signs:  Patient profile:   77 year old male Height:      63.75 inches Weight:      160 pounds BMI:     27.78 Pulse rate:   73 / minute Resp:     16 per minute BP sitting:   154 / 70  (right arm)  Vitals Entered By: Marrion Coy, CNA (Jan 08, 2010 12:10 PM)  Physical Exam  General:  Well developed, well nourished, in no acute distress. Head:  normocephalic and atraumatic Eyes:  PERRLA/EOM intact; conjunctiva and lids normal. Mouth:  Teeth, gums and palate normal. Oral mucosa normal. Neck:  Neck supple, mild JVD at 45. No masses, thyromegaly or abnormal cervical nodes. Chest Wall:  no deformities or breast masses noted Heart:  Non-displaced PMI, chest non-tender; regular rate and rhythm, S1, S2 without murmurs, rubs or gallops. Carotid upstroke normal, no bruit. Normal abdominal aortic size, no bruits. Femorals normal pulses, no bruits. Pedals normal pulses. No edema, no varicosities. Abdomen:  Bowel sounds positive; abdomen soft and non-tender without masses, organomegaly, or hernias noted. No hepatosplenomegaly. Msk:  Back normal, normal gait. Muscle strength and tone normal. Extremities:  No clubbing or cyanosis. Neurologic:  Alert and oriented x 3. Skin:  Intact without lesions or rashes. Cervical Nodes:  no significant adenopathy Axillary Nodes:  no  significant adenopathy Psych:  Normal affect.   EKG  Procedure date:  01/08/2010  Findings:      sinus rhythm, rate 65, axis within normal limits, intervals within normal limits, no acute ST-T wave changes  Impression & Recommendations:  Problem # 1:  CHEST PAIN (ICD-786.50) His chest pain is atypical and resolved. I have asked him to discontinue his ibuprofen and let me know if he has any increasing symptoms.  Problem # 2:   CAROTID ARTERY STENOSIS (ICD-433.10) It has been 2 years since his last carotid Doppler which demonstrated bilateral mild stenosis. I will repeat this. Orders: EKG w/ Interpretation (93000) Carotid Duplex (Carotid Duplex)  Problem # 3:  HYPERTENSION (ICD-401.9) His hypertension is not at target. I will increase his trandolapril to 3 mg daily. He will get a basic metabolic profile in 2 weeks. Orders: EKG w/ Interpretation (93000)  Patient Instructions: 1)  Your physician recommends that you schedule a follow-up appointment in: 3 months with Dr Antoine Poche 2)  Your physician recommends that you return for lab work in:  2 weeks BMP 401.1  v58.69 3)  Your physician has recommended you make the following change in your medication: Increase Trandolapril to 3 mg a day.  One 2mg  and one 1 mg tablet a day 4)  Your physician has requested that you have a carotid duplex. This test is an ultrasound of the carotid arteries in your neck. It looks at blood flow through these arteries that supply the brain with blood. Allow one hour for this exam. There are no restrictions or special instructions. Prescriptions: TRANDOLAPRIL 1 MG TABS (TRANDOLAPRIL) take one daily along with 2 mg tablet for a total of 3 mg a day  #90 x 3   Entered by:   Charolotte Capuchin, RN   Authorized by:   Rollene Rotunda, MD, [facility]   Signed by:   Charolotte Capuchin, RN on 01/08/2010   Method used:   Faxed to ...       [facility])       [address] [address]       [place], [place]  [address]       Ph: [phone]       Fax: [phone]   RxID:   [id number]

## 2010-10-10 NOTE — Assessment & Plan Note (Signed)
Summary: vITAMIN B12 INJECTION/RI   Nurse Visit   Allergies: 1)  * Hctz  Medication Administration  Injection # 1:    Medication: Vit B12 1000 mcg    Diagnosis: MACROCYTIC ANEMIA (ICD-281.9)    Route: IM    Site: R deltoid    Exp Date: 03/08/2012    Lot #: [id number]    Mfr: [employer]    Patient tolerated injection without complications    Given by: Mervin Hack CMA (AAMA) (July 31, 2010 9:30 AM)  Orders Added: 1)  Vit B12 1000 mcg [J3420] 2)  Admin of Therapeutic Inj  intramuscular or subcutaneous [96372]   Medication Administration  Injection # 1:    Medication: Vit B12 1000 mcg    Diagnosis: MACROCYTIC ANEMIA (ICD-281.9)    Route: IM    Site: R deltoid    Exp Date: 03/08/2012    Lot #: [id number]    Mfr: [employer]    Patient tolerated injection without complications    Given by: Mervin Hack CMA (AAMA) (July 31, 2010 9:30 AM)  Orders Added: 1)  Vit B12 1000 mcg [J3420] 2)  Admin of Therapeutic Inj  intramuscular or subcutaneous [19147]
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
## 2010-10-10 NOTE — Letter (Signed)
Summary: [name]  [name]   Imported By: Lanelle Bal 02/14/2010 10:01:31  _____________________________________________________________________  External Attachment:    Type:   Image     Comment:   External Document

## 2010-10-10 NOTE — Progress Notes (Signed)
Summary: more tick bites  Phone Note Call from Patient Call back at Home Phone [phone]   Caller: Patient Call For: Judith Part MD Summary of Call: Pt finished 10 day course of doxy this morning, after being bit by a tick.  He has had several more dear tick bites since the last one was removed here and he is asking if he should continue taking doxycyline.  He feels fine- no fever or achiness. Initial call taken by: Lowella Petties CMA,  May 20, 2010 11:43 AM  Follow-up for Phone Call        if he got the ticks off in a timely manner and feels fine I'm not very worried- but if he develops rash at bite site (or anywhere else) or fever or aches- let me know and f/u Follow-up by: Judith Part MD,  May 20, 2010 1:43 PM  Additional Follow-up for Phone Call Additional follow up Details #1::        Patient notified as instructed by telephone. Pt said he feels fine now and no rash or other symptoms. Pt will call if symptoms develop.Lewanda Rife LPN  May 20, 2010 2:35 PM

## 2010-10-10 NOTE — Progress Notes (Signed)
Summary: [name] (refill ibuprofen)  Medications Added IBUPROFEN 600 MG TABS (IBUPROFEN) one by mouth every 8 hours for 1 week       Phone Note Call from Patient Call back at [facility] Phone [phone]   Caller: Patient Summary of Call: Pt have questions about medication Initial call taken by: Judie Grieve,  December 26, 2009 10:31 AM  Follow-up for Phone Call        pt is still having some discomfort, refill for Ibuprofen 600mg  one every 8 hours [facility] no [facility]ill.  Pt has standing appointment with Dr Antoine Poche. Follow-up by: Charolotte Capuchin, RN,  December 27, 2009 9:46 AM    New/Updated Medications: IBUPROFEN 600 MG TABS (IBUPROFEN) one by mouth every 8 hours for 1 week Prescriptions: IBUPROFEN 600 MG TABS (IBUPROFEN) one by mouth every 8 hours for 1 week  #56 x 0   Entered by:   Charolotte Capuchin, RN   Authorized by:   Rollene Rotunda, MD, [facility]   Signed by:   Charolotte Capuchin, RN on 12/27/2009   Method used:   [facility]        [facility] [address] [phone][facility] (retail)       [address]       [place], [place]  [address]       Ph: [phone] or [phone]       Fax: [phone]   RxID:   [id number]

## 2010-10-10 NOTE — Miscellaneous (Signed)
  Clinical Lists Changes  Observations: Added new observation of US CAROTID: Stable, mild carotid artery disease, bilaterally 0-39% Bilateral ICA stenosis (01/21/2010 8:14)      Carotid Doppler  Procedure date:  01/21/2010  Findings:      Stable, mild carotid artery disease, bilaterally 0-39% Bilateral ICA stenosis

## 2010-10-10 NOTE — Assessment & Plan Note (Signed)
Summary: Elza Sortor/B-12/JRR   Nurse Visit   Allergies: 1)  * Hctz  Medication Administration  Injection # 1:    Medication: Vit B12 1000 mcg    Diagnosis: MACROCYTIC ANEMIA (ICD-281.9)    Route: IM    Site: R deltoid    Exp Date: 04/07/2012    Lot #: [id number]    Mfr: [employer]    Patient tolerated injection without complications    Given by: Delilah Shan CMA Duncan Dull) (August 21, 2010 9:39 AM)  Orders Added: 1)  Admin of Therapeutic Inj  intramuscular or subcutaneous [96372] 2)  Vit B12 1000 mcg [J3420]   Medication Administration  Injection # 1:    Medication: Vit B12 1000 mcg    Diagnosis: MACROCYTIC ANEMIA (ICD-281.9)    Route: IM    Site: R deltoid    Exp Date: 04/07/2012    Lot #: [id number]    Mfr: [employer]    Patient tolerated injection without complications    Given by: Delilah Shan CMA Duncan Dull) (August 21, 2010 9:39 AM)  Orders Added: 1)  Admin of Therapeutic Inj  intramuscular or subcutaneous [96372] 2)  Vit B12 1000 mcg [J3420]

## 2010-10-10 NOTE — Progress Notes (Signed)
Summary: Refill Simvastatin  Phone Note Call from Patient Call back at [phone]   Caller: Patient Call For: Dr. Milinda Antis Summary of Call: Pt needs a written rx for his Simvastatin for a 90 day supply to mail to his pharmacy.  Pt will also need a written rx for a local pharmacy for about 2 weeks worth to last until he gets his mail order back.  Call when ready. Initial call taken by: Sydell Axon,  November 25, 2007 10:52 AM  Follow-up for Phone Call        px written on EMR for call in  Follow-up by: Judith Part MD,  November 25, 2007 12:05 PM  Additional Follow-up for Phone Call Additional follow up Details #1::        pt also needs written 90 script for mail order thanks- printed and in my out box for pick up  Additional Follow-up by: Lowella Petties,  November 25, 2007 12:38 PM    Additional Follow-up for Phone Call Additional follow up Details #2::    pt to pick up mail order script, [facility] Follow-up by: Lowella Petties,  November 25, 2007 1:56 PM    Prescriptions: ZOCOR 20 MG TABS (SIMVASTATIN) Take 1 tablet by mouth every night  #90 x 3   Entered and Authorized by:   Judith Part MD   Signed by:   Judith Part MD on 11/25/2007   Method used:   Print then Give to Patient   RxID:   [id number] ZOCOR 20 MG TABS (SIMVASTATIN) Take 1 tablet by mouth every night  #30 x 0   Entered and Authorized by:   Judith Part MD   Signed by:   Judith Part MD on 11/25/2007   Method used:   Telephoned to ...         RxID:   [id number]

## 2010-11-25 LAB — BASIC METABOLIC PANEL
BUN: 13 mg/dL (ref 6–23)
CO2: 28 mEq/L (ref 19–32)
Calcium: 9.1 mg/dL (ref 8.4–10.5)
Chloride: 100 mEq/L (ref 96–112)
Creatinine, Ser: 0.89 mg/dL (ref 0.4–1.5)
GFR calc Af Amer: >60 mL/min (ref >60)
GFR calc non Af Amer: >60 mL/min (ref >60)
Glucose, Bld: 103 mg/dL — ABNORMAL HIGH (ref 70–99)
Potassium: 4.7 mEq/L (ref 3.5–5.1)
Sodium: 134 mEq/L — ABNORMAL LOW (ref 135–145)

## 2010-11-25 LAB — URINALYSIS, ROUTINE W REFLEX MICROSCOPIC
Bilirubin Urine: NEGATIVE
Glucose, UA: NEGATIVE mg/dL
Hgb urine dipstick: NEGATIVE
Ketones, ur: NEGATIVE mg/dL
Nitrite: NEGATIVE
Protein, ur: NEGATIVE mg/dL
Specific Gravity, Urine: 1.018 (ref 1.005–1.030)
Urobilinogen, UA: 0.2 mg/dL (ref 0.0–1.0)
pH: 7.5 (ref 5.0–8.0)

## 2010-11-25 LAB — DIFFERENTIAL
Basophils Absolute: 0 10*3/uL (ref 0.0–0.1)
Basophils Relative: 0 % (ref 0–1)
Eosinophils Absolute: 0.1 10*3/uL (ref 0.0–0.7)
Eosinophils Relative: 2 % (ref 0–5)
Lymphocytes Relative: 12 % (ref 12–46)
Lymphs Abs: 0.9 10*3/uL (ref 0.7–4.0)
Monocytes Absolute: 0.7 10*3/uL (ref 0.1–1.0)
Monocytes Relative: 9 % (ref 3–12)
Neutro Abs: 5.9 10*3/uL (ref 1.7–7.7)
Neutrophils Relative %: 77 % (ref 43–77)

## 2010-11-25 LAB — CBC
HCT: 40.9 % (ref 39.0–52.0)
Hemoglobin: 13.9 g/dL (ref 13.0–17.0)
MCHC: 34.1 g/dL (ref 30.0–36.0)
MCV: 97.2 fL (ref 78.0–100.0)
Platelets: 335 10*3/uL (ref 150–400)
RBC: 4.21 MIL/uL — ABNORMAL LOW (ref 4.22–5.81)
RDW: 14 % (ref 11.5–15.5)
WBC: 7.6 10*3/uL (ref 4.0–10.5)

## 2010-11-27 LAB — POCT I-STAT, CHEM 8
BUN: 22 mg/dL (ref 6–23)
Calcium, Ion: 1.17 mmol/L (ref 1.12–1.32)
Chloride: 103 mEq/L (ref 96–112)
Creatinine, Ser: 1 mg/dL (ref 0.4–1.5)
Glucose, Bld: 101 mg/dL — ABNORMAL HIGH (ref 70–99)
HCT: 41 % (ref 39.0–52.0)
Hemoglobin: 13.9 g/dL (ref 13.0–17.0)
Potassium: 4.6 mEq/L (ref 3.5–5.1)
Sodium: 137 mEq/L (ref 135–145)
TCO2: 28 mmol/L (ref 0–100)

## 2010-11-27 LAB — DIFFERENTIAL
Basophils Absolute: 0 10*3/uL (ref 0.0–0.1)
Basophils Relative: 0 % (ref 0–1)
Eosinophils Absolute: 0.1 10*3/uL (ref 0.0–0.7)
Eosinophils Relative: 1 % (ref 0–5)
Lymphocytes Relative: 7 % — ABNORMAL LOW (ref 12–46)
Lymphs Abs: 0.8 10*3/uL (ref 0.7–4.0)
Monocytes Absolute: 1.5 10*3/uL — ABNORMAL HIGH (ref 0.1–1.0)
Monocytes Relative: 13 % — ABNORMAL HIGH (ref 3–12)
Neutro Abs: 9.2 10*3/uL — ABNORMAL HIGH (ref 1.7–7.7)
Neutrophils Relative %: 80 % — ABNORMAL HIGH (ref 43–77)

## 2010-11-27 LAB — PROTIME-INR
INR: 1.09 (ref 0.00–1.49)
Prothrombin Time: 14 seconds (ref 11.6–15.2)

## 2010-11-27 LAB — CBC
HCT: 39.5 % (ref 39.0–52.0)
Hemoglobin: 13.6 g/dL (ref 13.0–17.0)
MCHC: 34.4 g/dL (ref 30.0–36.0)
MCV: 101.4 fL — ABNORMAL HIGH (ref 78.0–100.0)
Platelets: 256 10*3/uL (ref 150–400)
RBC: 3.9 MIL/uL — ABNORMAL LOW (ref 4.22–5.81)
RDW: 12.9 % (ref 11.5–15.5)
WBC: 11.5 10*3/uL — ABNORMAL HIGH (ref 4.0–10.5)

## 2010-11-27 LAB — D-DIMER, QUANTITATIVE: D-Dimer, Quant: <0.22 ug/mL-FEU (ref 0.00–0.48)

## 2010-11-27 LAB — POCT CARDIAC MARKERS
CKMB, poc: <1 ng/mL — ABNORMAL LOW (ref 1.0–8.0)
Myoglobin, poc: 79 ng/mL (ref 12–200)
Troponin i, poc: <0.05 ng/mL (ref 0.00–0.09)

## 2010-11-27 LAB — CK TOTAL AND CKMB (NOT AT ARMC)
CK, MB: 1.9 ng/mL (ref 0.3–4.0)
Relative Index: INVALID (ref 0.0–2.5)
Total CK: 42 U/L (ref 7–232)

## 2010-11-27 LAB — CARDIAC PANEL(CRET KIN+CKTOT+MB+TROPI)
CK, MB: 1.7 ng/mL (ref 0.3–4.0)
Relative Index: INVALID (ref 0.0–2.5)
Total CK: 38 U/L (ref 7–232)
Troponin I: <0.01 ng/mL (ref 0.00–0.06)

## 2010-11-27 LAB — TROPONIN I: Troponin I: <0.01 ng/mL (ref 0.00–0.06)

## 2011-01-03 ENCOUNTER — Other Ambulatory Visit: Payer: Self-pay | Admitting: *Deleted

## 2011-01-03 MED ORDER — TRANDOLAPRIL 1 MG PO TABS: ORAL_TABLET | ORAL | Status: DC

## 2011-01-03 MED ORDER — TRANDOLAPRIL 2 MG PO TABS: ORAL_TABLET | ORAL | Status: DC

## 2011-01-13 ENCOUNTER — Ambulatory Visit (INDEPENDENT_AMBULATORY_CARE_PROVIDER_SITE_OTHER): Payer: PRIVATE HEALTH INSURANCE | Admitting: Family Medicine

## 2011-01-13 ENCOUNTER — Encounter: Payer: Self-pay | Admitting: Family Medicine

## 2011-01-13 VITALS — BP 142/80 | HR 64 | Temp 98.1°F | Wt 155.0 lb

## 2011-01-13 DIAGNOSIS — L0390 Cellulitis, unspecified: Secondary | ICD-10-CM | POA: Insufficient documentation

## 2011-01-13 DIAGNOSIS — L03119 Cellulitis of unspecified part of limb: Secondary | ICD-10-CM

## 2011-01-13 DIAGNOSIS — L02419 Cutaneous abscess of limb, unspecified: Secondary | ICD-10-CM

## 2011-01-13 MED ORDER — DOXYCYCLINE HYCLATE 100 MG PO TABS: 100.0000 mg | ORAL_TABLET | Freq: Two times a day (BID) | ORAL | Status: AC

## 2011-01-13 NOTE — Patient Instructions (Signed)
Start the doxycycline and let me know if you have spreading redness or other concerns (such as a fever).  Take care and wear insect repellant when working outside.  Glad to see you today.

## 2011-01-13 NOTE — Progress Notes (Signed)
Tick bite.  Tick removed. Now with local erythema.  No FCNAV.  Feeling well o/w.  Meds, vitals, and allergies reviewed.   ROS: See HPI.  Otherwise, noncontributory.  nad R posterior knee.  3cm of erythema.  Marked.  Central epithelial disruption healing w/o purulent discharge or fluctuance.  No FB seen under magnification.

## 2011-01-13 NOTE — Assessment & Plan Note (Addendum)
Start doxy and fu prn.  Nontoxic.  No fevers.  No sign of systemic illness.  No FB noted on exam under magnification.  Okay for outpatient fu.  D/w pt and he understood. UV caution on doxy.

## 2011-01-21 NOTE — Assessment & Plan Note (Signed)
[facility]                            CARDIOLOGY OFFICE NOTE   NAME:CLAPPJolly, Bleicher                        MRN:          [id number]  DATE:05/01/2008                            DOB:          [birth year]    PRIMARY CARE PHYSICIAN:  Marne A. Tower, MD   REASON FOR PRESENTATION:  Evaluate the patient with multiple  cardiovascular risk factors and carotid stenosis.   HISTORY OF PRESENT ILLNESS:  The patient returns for followup.  It has  been about 2 years since I last saw him.  He has not had any  cardiovascular problems since the last time I saw him.  He continues to  be active, working in his yard, and feeding his horses.  With this level  of activity, he denies any chest pressure, neck, or arm discomfort.  He  does not have any palpitation, presyncope, or syncope.  He denies any  PND or orthopnea.   He did have carotid Dopplers today, which were followup from previous.  He has stable carotid plaque, which is less than 40% bilateral.   PAST MEDICAL HISTORY:  1. Hyperlipidemia x20 years.  2. Hypertension x20 years.  3. Carotid stenosis.  4. Hernia repair.  5. Back surgery.  6. Prostate surgery for prostate cancer.   ALLERGIES:  None.   MEDICATIONS:  1. Simvastatin 20 mg daily.  2. Trandolapril 20 mg daily.  3. Aspirin 81 mg daily.  4. Enablex.  5. Selenium.  6. Claritin.  7. Calcium.  8. Fish oil.  9. Multivitamin.   REVIEW OF SYSTEMS:  As stated in the HPI, otherwise negative for other  systems.   PHYSICAL EXAMINATION:  GENERAL:  The patient is in no distress.  VITAL SIGNS:  Blood pressure 144/69, heart rate 58 and regular, weight  152 pounds, and body mass index 24.  HEENT:  Eyes unremarkable.  Pupils equal, round, and reactive to light.  Fundi not visualized.  Oral mucosa unremarkable.  NECK:  No jugular venous distention at 45 degrees.  Carotid upstroke  brisk and symmetrical.  No bruits and no thyromegaly.  LYMPHATICS:  No  cervical, axillary, or inguinal adenopathy.  LUNGS:  Clear to auscultation bilaterally.  BACK:  No costovertebral angle tenderness.  CHEST:  Unremarkable.  HEART:  PMI not displaced or sustained.  S1 and S2 within normal limits.  No S3 and S4.  No clicks, rubs, or murmurs.  ABDOMEN:  Flat.  Positive bowel sounds normal in frequency and pitch.  No bruits, no rebound, no guarding, or no midline pulsatile mass.  No  hepatomegaly or splenomegaly.  SKIN:  No rashes and no nodules.  EXTREMITIES:  Pulses 2+ throughout.  No edema, no cyanosis, or no  clubbing.  NEURO:  Oriented to person, place, and time.  Cranial nerves II through  XII grossly intact.  Motor grossly intact.   EKG sinus rhythm, rate 59, axes within normals, intervals within normal  limits, and no acute ST-T wave changes.   ASSESSMENT AND PLAN:  1. Carotid stenosis.  The patient has mild-to-moderate carotid  stenosis and will be followed up in a couple of years.  He will      continue with risk reduction.  2. Hypertension.  Blood pressure is controlled.  He will continue on      medications as listed.  He says his blood pressure cuff at home      does not work, and so he is planning on getting a new one.  3. Palpitations.  He is not bothered by this.  No further therapy is      warranted.  4. Followup.  I will see him again in 2 years or sooner if needed.     Rollene Rotunda, MD, [facility]  Electronically Signed    JH/MedQ  DD: 05/01/2008  DT: 05/02/2008  Job #: [id number]   cc:   Marne A. Milinda Antis, MD

## 2011-01-24 NOTE — Assessment & Plan Note (Signed)
[facility]                                   ON-CALL NOTE   NAME:[name], [name]                          MRN:          [id number]  DATE:05/09/2006                            DOB:          [birth year]    ON CALL NOTE:  At 5:54 p.m.  Phone number is [phone].   OBJECTIVE:  Patient has pain in the lower abdomen.  Was seen by Dr. Ermalene Searing  for right lower quadrant pain last week.  It is near [employer].  He  thought he had an appendicitis problem, was sent for a CT scan which showed  fatty tissue somewhere near a hernia and then has a hernia on the left side  for sure.  Is in considerable pain.  Took Tylenol today which dulled the  pain.  Wants to know if there is anything else he can do.  His symptoms are  better when he is up and about. Bowel movements are okay and urination is  alright.   ASSESSMENT:  Abdominal pain unknown etiology.   PLAN:  I told him he could take Tylenol, increased dose two every four hours  if he needed to but was not willing to give him pain medication for an  abdominal problem that we could not know what the etiology is.  If he gets  worse go to the emergency room.  They could get him hooked up with a surgeon  quickly at that point.   PRIMARY CARE [name]:  Dr. Milinda Leach - home office is [facility].                                   Gregory Silence, MD   RNS/MedQ  DD:  05/09/2006  DT:  05/11/2006  Job #:  [id number]

## 2011-01-24 NOTE — Assessment & Plan Note (Signed)
[facility]                                   ON-CALL NOTE   [name], [name]                        MRN:          [id number]  DATE:05/08/2006                            DOB:          June 14, 1920    PRIMARY CARE DOCTOR:  Kerby Nora, MD   TIME OF CALL:  5:10 p.m.   SUBJECTIVE:  [name] is an elderly gentleman who has been seeing myself  for right lower quadrant abdominal pain.  Earlier today he had an abdominal  and pelvic CT.  Since the CT he has been having significant gas symptoms and  flatulence.  He has no fever.  No increase in abdominal pain.  No weakness.  No blood in his stools.  He is just wondering if the barium from the  abdominal CT could cause the worsening of gas symptoms.  He is also  wondering what he can do to decrease it.   ASSESSMENT AND PLAN:  Gas:  I do feel that his symptoms could be secondary  to the barium.  We discussed the possible use of Gas-Ex, Beno, or  simethicone to decrease his symptoms.  He will also avoid foods that can  increase gas over the next few days including fiber and lactose products.  Will go to urgent care emergency room if his abdominal pain worsens or if he  has fever, chills, rash, or difficulty swallowing and breathing.                                   Kerby Nora, MD   AB/MedQ  DD:  05/07/2006  DT:  05/08/2006  Job #:  [id number]

## 2011-01-24 NOTE — Assessment & Plan Note (Signed)
[facility]                              CARDIOLOGY OFFICE NOTE   NAME:[name], [name]                        MRN:          [id number]  DATE:04/07/2006                            DOB:          Aug 20, 1920   PRIMARY CARE PHYSICIAN:  Marne A. Tower, MD.   REASON FOR PRESENTATION:  The patient presents for evaluation of an  irregular heart rate.   HISTORY OF PRESENT ILLNESS:  Patient is a pleasant 77 year old gentleman who  I saw a few years ago for evaluation of risk factors.  He had a stress test  that was negative for any evidence of ischemia.  His EF was 65%.  He did  have some mild carotid stenosis and is due to get this followed up again  about now.   He has been doing well.  He does some yard work and takes care of some  horses.  He walks about a quarter mile a day to his mailbox.  With this  level of activity, he denies any chest discomfort, neck discomfort, arm  discomfort, activity induced nausea, vomiting, excessive diaphoresis.  He  has had no palpitations, presyncope or syncope.  He denies any PND or  orthopnea.  He has noticed when taking his blood pressure that at times the  arrhythmia indicator suggests an irregular heart rate though he does not  feel this.   PAST MEDICAL HISTORY:  1.  Hyperlipidemia x20 years.  2.  Hypertension x20 years.   PAST SURGICAL HISTORY:  1.  Hernia repair.  2.  Back surgery.  3.  Prostate surgery for prostate cancer.   ALLERGIES:  NO KNOWN DRUG ALLERGIES.   MEDICATIONS:  1.  Tarka.  2.  Simvastatin 20 mg daily.  3.  Aspirin 81 mg daily.  4.  Selenium.  5.  Claritin.  6.  Vitamin E.  7.  Celebrex.  8.  Fish oil.  9.  Calcium.  10. Vitamin D.   SOCIAL HISTORY:  He is married.  He is retired.  He has three children.  He  drinks a couple of drinks a day but does not smoke any cigarettes and never  has.   FAMILY HISTORY:  Noncontributory for early coronary artery disease, although  mother died  at 23 of nephritis.   REVIEW OF SYSTEMS:  As stated in the HPI and positive for urinary frequency  and nocturia, joint pains, spine compression.  Negative for other systems.   PHYSICAL EXAMINATION:  GENERAL APPEARANCE:  Patient is in no distress.  VITAL SIGNS:  Blood pressure 160/70, heart rate 62 and regular.  HEENT:  Eye lids unremarkable.  Pupils are equal, round and reactive to  light.  Fundi within normal limits.  Oral mucosa unremarkable.  NECK:  No jugular venous distension. Wave form within normal limits.  Carotid upstroke brisk and symmetric, no bruits, no thyromegaly.  LYMPHATICS:  No cervical, axillary or inguinal adenopathy.  LUNGS:  Clear to auscultation bilaterally.  BACK:  No costovertebral angle tenderness.  CHEST:  Unremarkable.  CARDIOVASCULAR:  PMI not displaced or sustained, S1  and S2 within normal  limits, no S3, no S4, no murmurs, no rubs.  ABDOMEN:  Flat, positive bowel sounds, normal in frequency and pitch.  No  midline bruit.  No pulsatile mass.  No hepatomegaly.  No splenomegaly.  SKIN:  No rashes, no nodules.  EXTREMITIES:  There are 2+ pulses throughout.  No bruits.  No cyanosis.  No  clubbing.  NEUROLOGIC:  Oriented to person, place and time.  Cranial nerves II-XII  grossly intact.  Motor grossly intact.   EKG:  Sinus rhythm, rate 62, axis within normal limits, first degree AV  block, poor anterior R-wave progression, no acute STT wave changes.   ASSESSMENT/PLAN:  1.  Arrhythmia. The patient had an arrhythmia noticed only on his monitor.      He is not having any symptomatic palpitations.  He has a normal heart by      previous evaluation.  At this point, no further cardiovascular testing      is suggested and I discussed this with him.  He has had recent      electrolytes and I will defer TSH testing to Dr. Milinda Antis.  2.  Hypertension.  Blood pressure is elevated.  He is going to call back      with his dose of Tarka as he does not know this.  I will most  likely go      up on that and get a BMET in two weeks.  He can have this followed at      home and with Dr. Milinda Antis.  3.  Carotid stenosis.  He will come back to have a repeat carotid ultrasound      as previously suggested.  4.  Risk reduction. He is encouraged to exercise even a little bit more.  He      will continue with Statins per Dr. Milinda Antis.                               Rollene Rotunda, MD, [facility]    JH/MedQ  DD:  04/07/2006  DT:  04/07/2006  Job #:  [id number]   cc:   Marne A. Milinda Antis, MD

## 2011-01-24 NOTE — Op Note (Signed)
[name], [name]                 ACCOUNT NO.:  [id number]   MEDICAL RECORD NO.:  [id number]          PATIENT TYPE:  AMB   LOCATION:  [facility]                         FACILITY:  [facility]   PHYSICIAN:  Courtney Paris, M.D.DATE OF BIRTH:  March 03, 1920   DATE OF PROCEDURE:  06/10/2006  DATE OF DISCHARGE:                                 OPERATIVE REPORT   PREOPERATIVE DIAGNOSIS:  Bladder neck contracture, persistent PSA post  prostatectomy for prostate cancer.   POSTOPERATIVE DIAGNOSIS:  Bladder neck contracture, persistent PSA post  prostatectomy for prostate cancer.   OPERATION:  TUR bladder neck contracture.   ANESTHESIA:  General.   SURGEON:  Dr. Aldean Ast.   BRIEF HISTORY:  This 77 year old patient is admitted with a tight bladder  neck contracture for a TUR.  He had some recent incontinence and hematuria  on cystoscopy in August.  He had a very tight bladder neck contracture.  I  could not get the flexible scope through.  He had a radical retropubic  prostatectomy done 01/1994 and a bladder neck contracture treated in 1978 by  history. PSA's were undetectable postoperative until 10/1994, but have been  stable.  The last one was 0.25 01/2006.   The patient was placed on the operating room table in the dorsal lithotomy  position.  After satisfactory induction of general anesthesia he was given  IV antibiotics, prepped and draped with Betadine.   The panendoscope was used to inspect the anterior urethra.  He had a tight  meatal stricture.  It had to be dilated with sounds.  Pictures were made of  the bladder neck contracture with the cystoscope.  I then passed a guidewire  through the open end into the bladder confirmed on fluoroscopy.   Then using the visual urethrotome I was able to incise the bladder neck  enough at 12 and 3 o'clock position to be able to get the scope into the  bladder.  I then replaced the visual urethrotome with the resectoscope and  with the regular 90  degree loop I was able to resect the bladder neck  leaving this opened pictures were made as this progressed.  Finally, the  bladder neck was at least a 26 or 28  [place] size opening.  I removed the  guidewire which had been left in during the entire procedure and after  hemostasis was achieved removed the scope and passed a #22 Foley catheter  without difficulty.  The irrigant was mildly pink and then cleared.  The  chips were sent for pathological examination and the Foley attached to a leg  bag.  The patient was then taken to the recovery room in  good condition.  The plan will be to leave the catheter for a full 3 weeks  and will do a chest CT in my office the same day as follow-up because of a  right lower lobe pulmonary nodule which was seen on the preoperative chest x-  ray.      Courtney Paris, M.D.  Electronically Signed     HMK/MEDQ  D:  06/10/2006  T:  06/11/2006  Job:  [id number]

## 2011-01-27 ENCOUNTER — Encounter: Payer: Self-pay | Admitting: Family Medicine

## 2011-01-27 ENCOUNTER — Ambulatory Visit (INDEPENDENT_AMBULATORY_CARE_PROVIDER_SITE_OTHER): Payer: Medicare Other | Admitting: Family Medicine

## 2011-01-27 VITALS — BP 124/76 | HR 64 | Temp 97.6°F | Wt 152.8 lb

## 2011-01-27 DIAGNOSIS — S30860A Insect bite (nonvenomous) of lower back and pelvis, initial encounter: Secondary | ICD-10-CM

## 2011-01-27 DIAGNOSIS — L0390 Cellulitis, unspecified: Secondary | ICD-10-CM

## 2011-01-27 DIAGNOSIS — W57XXXA Bitten or stung by nonvenomous insect and other nonvenomous arthropods, initial encounter: Secondary | ICD-10-CM

## 2011-01-27 NOTE — Progress Notes (Signed)
77 yo new to me here for tick bite.  Was seen on 01/12/2010 for another tick bite on his knee with ?cellulitis. Treated with doxycyline x  10 days.  Noticed tick on his buttocks while in shower yesterday.  Tick removed. Now with local erythema, quarter sized.  It has not grown in size.  No fevers, chills, malaise, headaches, other rashes.    Meds, vitals, and allergies reviewed.   ROS: See HPI.  Otherwise, noncontributory.  Physical exam:  Gen:  nad Skin:  Quarter sized circular erythematous lesion on right buttocks, no central clearing. No other rashes.

## 2011-01-27 NOTE — Assessment & Plan Note (Signed)
Reassurance provided. Does not appear infected. Advised to call us if lesion worsens or he develops rash, fever, body aches, headaches, etc. The patient indicates understanding of these issues and agrees with the plan.

## 2011-02-26 ENCOUNTER — Ambulatory Visit (INDEPENDENT_AMBULATORY_CARE_PROVIDER_SITE_OTHER): Payer: Medicare Other | Admitting: Family Medicine

## 2011-02-26 ENCOUNTER — Encounter: Payer: Self-pay | Admitting: Family Medicine

## 2011-02-26 ENCOUNTER — Telehealth: Payer: Self-pay | Admitting: *Deleted

## 2011-02-26 VITALS — BP 138/62 | HR 65 | Temp 98.2°F | Wt 156.2 lb

## 2011-02-26 DIAGNOSIS — L0390 Cellulitis, unspecified: Secondary | ICD-10-CM

## 2011-02-26 DIAGNOSIS — L0291 Cutaneous abscess, unspecified: Secondary | ICD-10-CM

## 2011-02-26 MED ORDER — SULFAMETHOXAZOLE-TRIMETHOPRIM 800-160 MG PO TABS: 2.0000 | ORAL_TABLET | Freq: Two times a day (BID) | ORAL | Status: AC

## 2011-02-26 NOTE — Assessment & Plan Note (Addendum)
Start septra and f/u prn.  He agrees.  Nontoxic.  No indication for I&D, no fluctuant mass. If progressive sx, he'll notify us.

## 2011-02-26 NOTE — Telephone Encounter (Signed)
Triage Record Num: [id number] Operator: Di Kindle Patient Name: [name] Call Date & Time: 02/26/2011 8:02:41AM Patient Phone: [phone] PCP: Audrie Gallus. Tower Patient Gender: Male PCP Fax : Patient DOB: 06-18-1920 Practice Name: [facility] Reason for Call: Pt calling to report onset 02/25/11 of irritation in private area, thinks may be insect bite, discomfort with walking, rd ~ 2 inches, afebrile. Guideline: Scrotum symptoms, advised of need to be seen in 4 hrs, review office hrs. Protocol(s) Used: Scrotum or Testicles Symptoms Recommended Outcome per Protocol: See Provider within 4 hours Reason for Outcome: New signs and symptoms of local infection Care Advice: ~ Elevate scrotum using a rolled soft bath towel placed between the legs and under the scrotum. ~ Call provider if symptoms worsen or new symptoms develop. ~ List, or take, all current prescription(s), nonprescription or alternative medication(s) to provider for evaluation. 02/26/2011 8:15:15AM Page 1 of 1 [email or web address]

## 2011-02-26 NOTE — Patient Instructions (Signed)
Star the septra today, take 2 pills twice a day.  If the area is getting bigger, redder, or more painful, then let us know.  Take care.

## 2011-02-26 NOTE — Progress Notes (Signed)
1 day with pain on medial L prox thigh.  Area is red and tender.  Sore with walking.  He brought in a spider and I checked it, but is isn't a brown recluse or a black widow.  No known insect bite.   Feeling well o/w.  No fevers.  Meds, vitals, and allergies reviewed.   ROS: See HPI.  Otherwise, noncontributory.  nad 2x5cm blanching erythema w/o fluctuant mass on the proximal L medial thigh.  No attached tick.

## 2011-03-30 ENCOUNTER — Other Ambulatory Visit: Payer: Self-pay | Admitting: Family Medicine

## 2011-04-30 ENCOUNTER — Ambulatory Visit: Payer: Medicare Other | Admitting: Family Medicine

## 2011-05-27 ENCOUNTER — Encounter: Payer: Self-pay | Admitting: Family Medicine

## 2011-05-27 ENCOUNTER — Ambulatory Visit (INDEPENDENT_AMBULATORY_CARE_PROVIDER_SITE_OTHER): Payer: Medicare Other | Admitting: Family Medicine

## 2011-05-27 VITALS — BP 110/60 | HR 64 | Temp 98.4°F | Wt 153.0 lb

## 2011-05-27 DIAGNOSIS — J209 Acute bronchitis, unspecified: Secondary | ICD-10-CM

## 2011-05-27 MED ORDER — AZITHROMYCIN 250 MG PO TABS: ORAL_TABLET | ORAL | Status: AC

## 2011-05-27 NOTE — Progress Notes (Signed)
  Subjective:    Patient ID: [name], male    DOB: 10-Mar-1920, 77 y.o.   MRN: [id number]  HPI  Acute Bronchitis: Patient presents for presents evaluation of bilateral ear congestion, nasal congestion, productive cough and sore throat. Symptoms began 7 days ago and are gradually worsening since that time.  Past history is significant for recent cellulitis and tick bite.  The PMH, PSH, Social History, Family History, Medications, and allergies have been reviewed in [facility], and have been updated if relevant.   Review of Systems ROS: GEN: Acute illness details above GI: Tolerating PO intake GU: maintaining adequate hydration and urination Pulm: No SOB Interactive and getting along well at home.  Otherwise, ROS is as per the HPI.     Objective:   Physical Exam   Physical Exam  Blood pressure 110/60, pulse 64, temperature 98.4 F (36.9 C), temperature source Oral, weight 153 lb (69.4 kg), SpO2 97.00%.  GEN: A and O x 3. WDWN. NAD.    ENT: Nose clear, ext NML.  No LAD.  No JVD.  TM's clear. Oropharynx clear.  PULM: Normal WOB, no distress. No crackles, wheezes, but scattered rhonchi. CV: RRR, no M/G/R, No rubs, No JVD.     EXT: warm and well-perfused, No c/c/e. PSYCH: Pleasant and conversant.       Assessment & Plan:   1. Acute bronchitis  azithromycin (ZITHROMAX) 250 MG tablet    BRONCHITIS -Viral or baterial infections of the lung. Fever, cough, chest pain, shortness of breath, phlegm production, fatigue are symptoms.  Treatment: 1. Take all medicines 2. Antibiotics  3. Cough suppressants 4. Bronchodilators: an inhaler 5. Expectorant like Guaifenesin (Robitussin, Mucinex)  Fluids and Moisture help: drink lots of fluids Vaporizier or humidifier in room, shower steam --help loosen secretions and sooth breathing passages  Elevate head slightly when trying to sleep.

## 2011-06-02 ENCOUNTER — Telehealth: Payer: Self-pay | Admitting: *Deleted

## 2011-06-02 DIAGNOSIS — I10 Essential (primary) hypertension: Secondary | ICD-10-CM

## 2011-06-02 DIAGNOSIS — Z8546 Personal history of malignant neoplasm of prostate: Secondary | ICD-10-CM

## 2011-06-02 DIAGNOSIS — D649 Anemia, unspecified: Secondary | ICD-10-CM

## 2011-06-02 DIAGNOSIS — E7800 Pure hypercholesterolemia, unspecified: Secondary | ICD-10-CM

## 2011-06-02 NOTE — Telephone Encounter (Signed)
Pt would like to schedule labs and then have a follow up visit with you to discuss.  Ok to schedule?

## 2011-06-02 NOTE — Telephone Encounter (Signed)
Please schedule 30 min annual exam/ check up with labs prior

## 2011-06-03 NOTE — Telephone Encounter (Signed)
I ordered the future labs

## 2011-06-03 NOTE — Telephone Encounter (Signed)
Patient notified as instructed by telephone. Pt scheduled CPX with Dr Milinda Antis on 06/11/11 at 3:15pm. And fasting lab appt scheduled for 06/05/11 at 9:05 am. Pt would like PSA, Vit B 12 and Na checked. Dr Milinda Antis please order or let me know what specific labs you want pt to have on 06/05/11. Thank you.

## 2011-06-05 ENCOUNTER — Ambulatory Visit (INDEPENDENT_AMBULATORY_CARE_PROVIDER_SITE_OTHER): Payer: Medicare Other

## 2011-06-05 ENCOUNTER — Other Ambulatory Visit (INDEPENDENT_AMBULATORY_CARE_PROVIDER_SITE_OTHER): Payer: Medicare Other

## 2011-06-05 DIAGNOSIS — Z23 Encounter for immunization: Secondary | ICD-10-CM

## 2011-06-05 DIAGNOSIS — E7800 Pure hypercholesterolemia, unspecified: Secondary | ICD-10-CM

## 2011-06-05 DIAGNOSIS — D649 Anemia, unspecified: Secondary | ICD-10-CM

## 2011-06-05 DIAGNOSIS — D539 Nutritional anemia, unspecified: Secondary | ICD-10-CM

## 2011-06-05 DIAGNOSIS — Z8546 Personal history of malignant neoplasm of prostate: Secondary | ICD-10-CM

## 2011-06-05 DIAGNOSIS — I10 Essential (primary) hypertension: Secondary | ICD-10-CM

## 2011-06-05 LAB — CBC WITH DIFFERENTIAL/PLATELET
Basophils Absolute: 0 10*3/uL (ref 0.0–0.1)
Basophils Relative: 0.2 % (ref 0.0–3.0)
Eosinophils Absolute: 0.2 10*3/uL (ref 0.0–0.7)
Eosinophils Relative: 2.1 % (ref 0.0–5.0)
HCT: 41.7 % (ref 39.0–52.0)
Hemoglobin: 14.1 g/dL (ref 13.0–17.0)
Lymphocytes Relative: 16.9 % (ref 12.0–46.0)
Lymphs Abs: 1.2 10*3/uL (ref 0.7–4.0)
MCHC: 33.8 g/dL (ref 30.0–36.0)
MCV: 101.8 fl — ABNORMAL HIGH (ref 78.0–100.0)
Monocytes Absolute: 0.8 10*3/uL (ref 0.1–1.0)
Monocytes Relative: 11.2 % (ref 3.0–12.0)
Neutro Abs: 5.1 10*3/uL (ref 1.4–7.7)
Neutrophils Relative %: 69.6 % (ref 43.0–77.0)
Platelets: 405 10*3/uL — ABNORMAL HIGH (ref 150.0–400.0)
RBC: 4.1 Mil/uL — ABNORMAL LOW (ref 4.22–5.81)
RDW: 13.1 % (ref 11.5–14.6)
WBC: 7.3 10*3/uL (ref 4.5–10.5)

## 2011-06-06 LAB — COMPREHENSIVE METABOLIC PANEL
ALT: 24 U/L (ref 0–53)
AST: 26 U/L (ref 0–37)
Albumin: 3.9 g/dL (ref 3.5–5.2)
Alkaline Phosphatase: 52 U/L (ref 39–117)
BUN: 20 mg/dL (ref 6–23)
CO2: 29 mEq/L (ref 19–32)
Calcium: 8.8 mg/dL (ref 8.4–10.5)
Chloride: 102 mEq/L (ref 96–112)
Creatinine, Ser: 1 mg/dL (ref 0.4–1.5)
GFR: 73.9 mL/min (ref >60.00)
Glucose, Bld: 89 mg/dL (ref 70–99)
Potassium: 5.3 mEq/L — ABNORMAL HIGH (ref 3.5–5.1)
Sodium: 137 mEq/L (ref 135–145)
Total Bilirubin: 0.6 mg/dL (ref 0.3–1.2)
Total Protein: 6.3 g/dL (ref 6.0–8.3)

## 2011-06-06 LAB — LIPID PANEL
Cholesterol: 148 mg/dL (ref 0–200)
HDL: 43.2 mg/dL (ref >39.00)
LDL Cholesterol: 89 mg/dL (ref 0–99)
Total CHOL/HDL Ratio: 3
Triglycerides: 78 mg/dL (ref 0.0–149.0)
VLDL: 15.6 mg/dL (ref 0.0–40.0)

## 2011-06-06 LAB — PSA: PSA: 0.31 ng/mL (ref 0.10–4.00)

## 2011-06-06 LAB — VITAMIN B12: Vitamin B-12: >1500 pg/mL — ABNORMAL HIGH (ref 211–911)

## 2011-06-06 LAB — TSH: TSH: 5.65 u[IU]/mL — ABNORMAL HIGH (ref 0.35–5.50)

## 2011-06-10 ENCOUNTER — Encounter: Payer: Self-pay | Admitting: Family Medicine

## 2011-06-11 ENCOUNTER — Ambulatory Visit (INDEPENDENT_AMBULATORY_CARE_PROVIDER_SITE_OTHER): Payer: Medicare Other | Admitting: Family Medicine

## 2011-06-11 ENCOUNTER — Encounter: Payer: Self-pay | Admitting: Family Medicine

## 2011-06-11 VITALS — BP 118/62 | HR 60 | Temp 97.9°F | Ht 63.0 in | Wt 150.0 lb

## 2011-06-11 DIAGNOSIS — T148XXA Other injury of unspecified body region, initial encounter: Secondary | ICD-10-CM

## 2011-06-11 DIAGNOSIS — I10 Essential (primary) hypertension: Secondary | ICD-10-CM

## 2011-06-11 DIAGNOSIS — D539 Nutritional anemia, unspecified: Secondary | ICD-10-CM

## 2011-06-11 DIAGNOSIS — D649 Anemia, unspecified: Secondary | ICD-10-CM

## 2011-06-11 DIAGNOSIS — R7989 Other specified abnormal findings of blood chemistry: Secondary | ICD-10-CM

## 2011-06-11 DIAGNOSIS — E875 Hyperkalemia: Secondary | ICD-10-CM

## 2011-06-11 DIAGNOSIS — Z8546 Personal history of malignant neoplasm of prostate: Secondary | ICD-10-CM

## 2011-06-11 DIAGNOSIS — R6889 Other general symptoms and signs: Secondary | ICD-10-CM

## 2011-06-11 DIAGNOSIS — E7800 Pure hypercholesterolemia, unspecified: Secondary | ICD-10-CM

## 2011-06-11 NOTE — Assessment & Plan Note (Signed)
New/ mild/ asymptomatic with K of 5.3 No symptoms ? If from diet or ace Re check today

## 2011-06-11 NOTE — Assessment & Plan Note (Signed)
Very good control with diet and zocor No problems Rev lab with pt Rev low sat fat diet

## 2011-06-11 NOTE — Progress Notes (Signed)
Subjective:    Patient ID: [name], male    DOB: [birth year], 77 y.o.   MRN: [id number]  HPI Here for annual check up of chronic medical problems and to review health mt list  Had a case of bronchitis about 3 weeks ago from his wife - and is getting over cough  Is overall feeling better  Used mucinex   HTN in good control with mavik 118/62 No cp or ha or palpitations  Wt is down 2 lb with bmi of 26- healthy  Has been eating a healthy diet   K 5.3- this is a little high  Ate a lot of cantelope and bananas  Is on ace Water intake - not as good as it should be  Vitamins- takes B12 sporatically and had gone through series of shots , no mvi   Lipids are well controlled with zocor and diet Lab Results  Component Value Date   CHOL 148 06/05/2011   CHOL 138 07/18/2010   CHOL 103 01/16/2010   Lab Results  Component Value Date   HDL 43.20 06/05/2011   HDL 40.98* 07/18/2010   HDL 32.50* 01/16/2010   Lab Results  Component Value Date   LDLCALC 89 06/05/2011   LDLCALC 79 07/18/2010   LDLCALC 54 01/16/2010   Lab Results  Component Value Date   TRIG 78.0 06/05/2011   TRIG 104.0 07/18/2010   TRIG 81.0 01/16/2010   Lab Results  Component Value Date   CHOLHDL 3 06/05/2011   CHOLHDL 4 07/18/2010   CHOLHDL 3 01/16/2010   No results found for this basename: LDLDIRECT     B12 level is ok  Has hx of macrocytosis/ anemia  Cbc ok - platelets are 405   tsh is very slt high at 5.6 Has never had any thyroid problems  Symptoms - no fatigue or hair loss or dry skin   Had flu shot already  utd pneumovax Td 2002-- ? Can get at health dept Zoster vaccine 2006  colonosc ok 5/04  dexa 01 normal Has some kyphosis and hx of a comp fx On ca and D  Past hx of prostate ca  psa this check is .31 Has not seen doctor for follow up for that  Had a prostatectomy - and no problem  No urinary problems overall - has always been more frequent    Patient Active Problem List  Diagnoses    . HYPERCHOLESTEROLEMIA  . MACROCYTIC ANEMIA  . ANEMIA, MILD  . HYPERTENSION  . CAROTID ARTERY STENOSIS  . SPONDYLOSIS, LUMBAR  . NECK PAIN, CHRONIC  . BACK PAIN, CHRONIC  . KYPHOSIS  . URINARY FREQUENCY  . FECAL OCCULT BLOOD  . COMPRESSION FRACTURE  . NEOPLASM, MALIGNANT, PROSTATE, HX OF  . Abnormal TSH  . Hyperkalemia   Past Medical History  Diagnosis Date  . Hypertension   . Carotid artery stenosis   . Hyperlipidemia   . History of prostate cancer   . Degenerative disc disease   . Kyphosis   . Fractures     compression  . Non-cardiac chest pain 04/11    hospital ? [facility]  . Cancer     prostate   Past Surgical History  Procedure Date  . Prostate surgery   . Herniated disc surgery 1990    L3-4  . Inguinal hernia repair 1979    right  . Tonsillectomy   . Prostate cancer surgery   . Cardiovascular stress test 2004    cardiolite negative  .  Carotid doppler 11/05    0-39% bilatteral   History  Substance Use Topics  . Smoking status: Never Smoker   . Smokeless tobacco: Not on file  . Alcohol Use: Yes     2 drinks per day   Family History  Problem Relation Age of Onset  . Hypertension Father    No Known Allergies Current Outpatient Prescriptions on File Prior to Visit  Medication Sig Dispense Refill  . aspirin EC 81 MG EC tablet Take 81 mg by mouth daily.        . cyanocobalamin 1000 MCG tablet Take 1,000 mcg by mouth every other day.       . Selenium (SELENIMIN-200 PO) Take by mouth daily.        . simvastatin (ZOCOR) 20 MG tablet TAKE 1 TABLET BY MOUTH AT BEDTIME  90 tablet  1  . trandolapril (MAVIK) 1 MG tablet One tablet every day with a 2 mg tablet  90 tablet  3  . trandolapril (MAVIK) 2 MG tablet Take one tablet daily with 1 mg tablet  90 tablet  3  . guaiFENesin (MUCINEX) 600 MG 12 hr tablet Use as directed       . Loratadine 10 MG CAPS Take by mouth daily.             Review of Systems Review of Systems  Constitutional: Negative for fever,  appetite change, fatigue and unexpected weight change.  Eyes: Negative for pain and visual disturbance.  Respiratory: Negative for cough and shortness of breath.   Cardiovascular: Negative for cp or palpitations    Gastrointestinal: Negative for nausea, diarrhea and constipation.  Genitourinary: Negative for urgency and frequency.  Skin: Negative for pallor or rash   Neurological: Negative for weakness, light-headedness, numbness and headaches.  Hematological: Negative for adenopathy. Does not bruise/bleed easily.  Psychiatric/Behavioral: Negative for dysphoric mood. The patient is not nervous/anxious.         Objective:   Physical Exam  Constitutional: He appears well-developed and well-nourished. No distress.       Somewhat frail app elderly male with kyphosis   HENT:  Head: Normocephalic and atraumatic.  Right Ear: External ear normal.  Left Ear: External ear normal.  Nose: Nose normal.  Mouth/Throat: Oropharynx is clear and moist.       Scant cerumen noted   Eyes: Conjunctivae and EOM are normal. Pupils are equal, round, and reactive to light.  Neck: Normal range of motion. Neck supple. No JVD present. Carotid bruit is not present. No thyromegaly present.  Cardiovascular: Normal rate, regular rhythm, normal heart sounds and intact distal pulses.   Pulmonary/Chest: Effort normal and breath sounds normal. No respiratory distress. He has no wheezes.  Abdominal: Soft. Bowel sounds are normal. He exhibits no distension, no abdominal bruit and no mass. There is no tenderness.  Musculoskeletal: Normal range of motion. He exhibits no edema and no tenderness.       Notable kyphosis Some changes of OA in hands and feet   Lymphadenopathy:    He has no cervical adenopathy.  Neurological: He is alert. He has normal reflexes. No cranial nerve deficit. Coordination normal.       Mentally sharp    Skin: Skin is warm and dry. No rash noted. No erythema. No pallor.  Psychiatric: He has a  normal mood and affect.          Assessment & Plan:

## 2011-06-11 NOTE — Assessment & Plan Note (Signed)
Check thyroid panel today Is asymptomatic

## 2011-06-11 NOTE — Assessment & Plan Note (Signed)
This is stable Hb ok  Platelets slt high  On B12 and level is not low Continue to follow

## 2011-06-11 NOTE — Assessment & Plan Note (Signed)
Hx of total prostatectomy psa stable  Pt is deciding whether to f/u with urol at his age

## 2011-06-11 NOTE — Assessment & Plan Note (Signed)
With this along with kyphosis and ht loss I would like to get a dexa (last one nl )- pt will check to see if ins covers

## 2011-06-11 NOTE — Assessment & Plan Note (Signed)
Good control  Continue mavik - unless K remains high  Good health habits

## 2011-06-11 NOTE — Patient Instructions (Signed)
Re checking potassium level today Also checking a thyroid panel You need a Tdap vaccine - since medicare does not pay for it - call your health dept to see if you can get one there  I would like to get a bone density test on you (dexa) if your insurance covers it -- please call your insurance company and tell them you have kyphosis with loss of height and also a history of compression fracture Let me know if they cover it and I will schedule it  Continue calcium and vitamin D and weight bearing exercise

## 2011-06-12 LAB — POTASSIUM: Potassium: 5.2 mEq/L — ABNORMAL HIGH (ref 3.5–5.1)

## 2011-06-12 LAB — TSH: TSH: 3.67 u[IU]/mL (ref 0.35–5.50)

## 2011-06-12 LAB — T3 UPTAKE: T3 Uptake: 39.2 % — ABNORMAL HIGH (ref 22.5–37.0)

## 2011-06-12 LAB — T4, FREE: Free T4: 0.68 ng/dL (ref 0.60–1.60)

## 2011-06-13 ENCOUNTER — Telehealth: Payer: Self-pay

## 2011-06-13 NOTE — Telephone Encounter (Signed)
Opened phone note to update medication list as instructed from Dr Milinda Antis re: result note dated 06/12/11.

## 2011-08-13 ENCOUNTER — Encounter: Payer: Self-pay | Admitting: Family Medicine

## 2011-08-13 ENCOUNTER — Ambulatory Visit (INDEPENDENT_AMBULATORY_CARE_PROVIDER_SITE_OTHER): Payer: Medicare Other | Admitting: Family Medicine

## 2011-08-13 VITALS — BP 140/64 | HR 68 | Temp 98.2°F | Ht 63.0 in | Wt 156.5 lb

## 2011-08-13 DIAGNOSIS — I10 Essential (primary) hypertension: Secondary | ICD-10-CM

## 2011-08-13 DIAGNOSIS — E875 Hyperkalemia: Secondary | ICD-10-CM

## 2011-08-13 NOTE — Patient Instructions (Signed)
Get your Tdap vaccine at the health department  Cut back your [facility] to 2 mg once daily -- because potassium is slightly high  Your blood pressure is ok  Stay active and eat a healthy diet  Follow up in about 6 weeks

## 2011-08-13 NOTE — Assessment & Plan Note (Signed)
Very mild and asymptomatic  Will again ask him to cut ace a bit (mavik to 2  From 3 mg) Re check at 6 wk f/u

## 2011-08-13 NOTE — Progress Notes (Signed)
Subjective:    Patient ID: [name], male    DOB: September 07, 1920, 77 y.o.   MRN: [id number]  HPI Here for f/u of HTN and lipid and hyperkalemia  Is feeling pretty good overall Nothing new going on medically    bp is     Today 140/64 Did cut mavik to 2 mg daily- but he did not follow those instructions , because Dr Antoine Poche to go back up on it  No cp or palpitations or headaches or edema  No side effects to medicines  Had slt high K   Last K 5.2- continue to monitor Did cut mavik to 2 mg bp ok No symptoms   tsh nl 2nd check Lab Results  Component Value Date   TSH 3.67 06/11/2011     Needs Tdap- will get at the health dept due to cost/ insurance  Patient Active Problem List  Diagnoses  . HYPERCHOLESTEROLEMIA  . MACROCYTIC ANEMIA  . ANEMIA, MILD  . HYPERTENSION  . CAROTID ARTERY STENOSIS  . SPONDYLOSIS, LUMBAR  . NECK PAIN, CHRONIC  . BACK PAIN, CHRONIC  . KYPHOSIS  . URINARY FREQUENCY  . FECAL OCCULT BLOOD  . COMPRESSION FRACTURE  . NEOPLASM, MALIGNANT, PROSTATE, HX OF  . Hyperkalemia   Past Medical History  Diagnosis Date  . Hypertension   . Carotid artery stenosis   . Hyperlipidemia   . History of prostate cancer   . Degenerative disc disease   . Kyphosis   . Fractures     compression  . Non-cardiac chest pain 04/11    hospital ? [facility]  . Cancer     prostate   Past Surgical History  Procedure Date  . Prostate surgery   . Herniated disc surgery 1990    L3-4  . Inguinal hernia repair 1979    right  . Tonsillectomy   . Prostate cancer surgery   . Cardiovascular stress test 2004    cardiolite negative  . Carotid doppler 11/05    0-39% bilatteral   History  Substance Use Topics  . Smoking status: Never Smoker   . Smokeless tobacco: Not on file  . Alcohol Use: Yes     2 drinks per day   Family History  Problem Relation Age of Onset  . Hypertension Father    No Known Allergies Current Outpatient Prescriptions on File Prior to Visit    Medication Sig Dispense Refill  . aspirin EC 81 MG EC tablet Take 81 mg by mouth daily.        . Selenium (SELENIMIN-200 PO) Take by mouth daily.        . simvastatin (ZOCOR) 20 MG tablet TAKE 1 TABLET BY MOUTH AT BEDTIME  90 tablet  1  . trandolapril (MAVIK) 2 MG tablet Take one tablet daily with 1 mg tablet  90 tablet  3  . VITAMIN D, CHOLECALCIFEROL, PO Take 1 tablet by mouth daily.             Review of Systems Review of Systems  Constitutional: Negative for fever, appetite change, fatigue and unexpected weight change.  Eyes: Negative for pain and visual disturbance.  Respiratory: Negative for cough and shortness of breath.   Cardiovascular: Negative for cp or palpitations    Gastrointestinal: Negative for nausea, diarrhea and constipation.  Genitourinary: Negative for urgency and frequency.  Skin: Negative for pallor or rash   Neurological: Negative for weakness, light-headedness, numbness and headaches.  Hematological: Negative for adenopathy. Does not bruise/bleed easily.  Psychiatric/Behavioral: Negative for dysphoric mood. The patient is not nervous/anxious.          Objective:   Physical Exam  Constitutional: He appears well-developed and well-nourished. No distress.  HENT:  Head: Normocephalic.  Mouth/Throat: Oropharynx is clear and moist.  Eyes: Conjunctivae and EOM are normal. Pupils are equal, round, and reactive to light. No scleral icterus.  Neck: Normal range of motion. Neck supple. No JVD present. Carotid bruit is not present. No thyromegaly present.  Cardiovascular: Normal rate, regular rhythm and intact distal pulses.  Exam reveals no gallop.   Pulmonary/Chest: Effort normal and breath sounds normal. No respiratory distress. He has no wheezes.  Abdominal: Soft. Bowel sounds are normal.  Musculoskeletal: He exhibits no edema and no tenderness.  Lymphadenopathy:    He has no cervical adenopathy.  Neurological: He is alert. No cranial nerve deficit. He  exhibits normal muscle tone. Coordination normal.  Skin: Skin is warm and dry. No rash noted. No erythema. No pallor.  Psychiatric: He has a normal mood and affect.          Assessment & Plan:

## 2011-08-13 NOTE — Assessment & Plan Note (Signed)
This is well controlled Will have to cut mavik to 2 mg for mild hyperkalemia and f/u 6 wk If bp up may need to change or add agent Good health habits

## 2011-09-15 ENCOUNTER — Telehealth: Payer: Self-pay | Admitting: Cardiology

## 2011-09-15 ENCOUNTER — Ambulatory Visit: Payer: Self-pay | Admitting: Ophthalmology

## 2011-09-15 NOTE — Telephone Encounter (Signed)
Pt had EKG done for eye surgery and he will have it sent to you and if it looks funny please call so he can set up an appt

## 2011-09-18 ENCOUNTER — Other Ambulatory Visit: Payer: Self-pay | Admitting: Family Medicine

## 2011-09-18 NOTE — Telephone Encounter (Signed)
[facility] [address] request refill simvastatin 20 mg #30 x 11.

## 2011-09-19 NOTE — Telephone Encounter (Signed)
Have never received EKG.  Will call pt and let him know

## 2011-09-19 NOTE — Telephone Encounter (Signed)
Pt aware.

## 2011-09-22 ENCOUNTER — Ambulatory Visit: Payer: Self-pay | Admitting: Ophthalmology

## 2011-09-24 ENCOUNTER — Ambulatory Visit: Payer: Medicare Other | Admitting: Family Medicine

## 2011-09-24 ENCOUNTER — Telehealth: Payer: Self-pay | Admitting: Family Medicine

## 2011-09-24 NOTE — Telephone Encounter (Signed)
Pt called, says he has heart surgery pending. He is coming in to see Dr. Milinda Antis next mth for surgery clearance. Says he had a EKG at [facility].  Wants you to order the EKG prior to his visit...cdavis

## 2011-09-30 ENCOUNTER — Ambulatory Visit: Payer: Medicare Other | Admitting: Family Medicine

## 2011-09-30 DIAGNOSIS — Z0289 Encounter for other administrative examinations: Secondary | ICD-10-CM

## 2011-10-13 ENCOUNTER — Encounter: Payer: Self-pay | Admitting: Family Medicine

## 2011-10-13 ENCOUNTER — Ambulatory Visit (INDEPENDENT_AMBULATORY_CARE_PROVIDER_SITE_OTHER): Payer: Medicare Other | Admitting: Family Medicine

## 2011-10-13 VITALS — BP 130/64 | HR 64 | Temp 98.1°F | Ht 63.0 in | Wt 154.8 lb

## 2011-10-13 DIAGNOSIS — E875 Hyperkalemia: Secondary | ICD-10-CM

## 2011-10-13 DIAGNOSIS — E7800 Pure hypercholesterolemia, unspecified: Secondary | ICD-10-CM

## 2011-10-13 DIAGNOSIS — I10 Essential (primary) hypertension: Secondary | ICD-10-CM

## 2011-10-13 LAB — RENAL FUNCTION PANEL
Albumin: 3.6 g/dL (ref 3.5–5.2)
BUN: 17 mg/dL (ref 6–23)
CO2: 27 mEq/L (ref 19–32)
Calcium: 8.7 mg/dL (ref 8.4–10.5)
Chloride: 101 mEq/L (ref 96–112)
Creatinine, Ser: 0.9 mg/dL (ref 0.4–1.5)
GFR: 86.56 mL/min (ref >60.00)
Glucose, Bld: 88 mg/dL (ref 70–99)
Phosphorus: 3 mg/dL (ref 2.3–4.6)
Potassium: 4.7 mEq/L (ref 3.5–5.1)
Sodium: 134 mEq/L — ABNORMAL LOW (ref 135–145)

## 2011-10-13 MED ORDER — TRANDOLAPRIL 2 MG PO TABS: ORAL_TABLET | ORAL | Status: DC

## 2011-10-13 NOTE — Assessment & Plan Note (Signed)
Re check today on lesser dose of ace bp controlled No change on recent EKG and no symptoms

## 2011-10-13 NOTE — Progress Notes (Signed)
Subjective:    Patient ID: [name], male    DOB: [birth year], 78 y.o.   MRN: [id number]  HPI Here for f/u of HTN and hyperkalemia  On mavik-- did cut dose to 2 mg  Had EKG before his cataract surgery -- was normal  Went ahead with the surgery - and it went very well  Will need new glasses   bp is 130/64     Today No cp or palpitations or headaches or edema  No side effects to medicines    Due for K check Last was 5.2 Renal fxn nl  Lab Results  Component Value Date   K 5.2* 06/11/2011      Chemistry      Component Value Date/Time   NA 137 06/05/2011 0919   K 5.2* 06/11/2011 1626   CL 102 06/05/2011 0919   CO2 29 06/05/2011 0919   BUN 20 06/05/2011 0919   CREATININE 1.0 06/05/2011 0919      Component Value Date/Time   CALCIUM 8.8 06/05/2011 0919   ALKPHOS 52 06/05/2011 0919   AST 26 06/05/2011 0919   ALT 24 06/05/2011 0919   BILITOT 0.6 06/05/2011 0919     no muscle cramps or other symptoms   Patient Active Problem List  Diagnoses  . HYPERCHOLESTEROLEMIA  . MACROCYTIC ANEMIA  . ANEMIA, MILD  . HYPERTENSION  . CAROTID ARTERY STENOSIS  . SPONDYLOSIS, LUMBAR  . NECK PAIN, CHRONIC  . BACK PAIN, CHRONIC  . KYPHOSIS  . URINARY FREQUENCY  . FECAL OCCULT BLOOD  . COMPRESSION FRACTURE  . NEOPLASM, MALIGNANT, PROSTATE, HX OF  . Hyperkalemia   Past Medical History  Diagnosis Date  . Hypertension   . Carotid artery stenosis   . Hyperlipidemia   . History of prostate cancer   . Degenerative disc disease   . Kyphosis   . Fractures     compression  . Non-cardiac chest pain 04/11    hospital ? [facility]  . Cancer     prostate   Past Surgical History  Procedure Date  . Prostate surgery   . Herniated disc surgery 1990    L3-4  . Inguinal hernia repair 1979    right  . Tonsillectomy   . Prostate cancer surgery   . Cardiovascular stress test 2004    cardiolite negative  . Carotid doppler 11/05    0-39% bilatteral   History  Substance Use Topics  . Smoking  status: Never Smoker   . Smokeless tobacco: Not on file  . Alcohol Use: Yes     2 drinks per day   Family History  Problem Relation Age of Onset  . Hypertension Father    No Known Allergies Current Outpatient Prescriptions on File Prior to Visit  Medication Sig Dispense Refill  . aspirin EC 81 MG EC tablet Take 81 mg by mouth daily.        . Selenium (SELENIMIN-200 PO) Take by mouth daily.        . simvastatin (ZOCOR) 20 MG tablet TAKE 1 TABLET BY MOUTH AT BEDTIME  90 tablet  3  . VITAMIN D, CHOLECALCIFEROL, PO Take 1 tablet by mouth daily.           Review of Systems Review of Systems  Constitutional: Negative for fever, appetite change, fatigue and unexpected weight change.  Eyes: Negative for pain and visual disturbance.  Respiratory: Negative for cough and shortness of breath.   Cardiovascular: Negative for cp or palpitations  Gastrointestinal: Negative for nausea, diarrhea and constipation.  Genitourinary: Negative for urgency and frequency.  Skin: Negative for pallor or rash   Neurological: Negative for weakness, light-headedness, numbness and headaches.  Hematological: Negative for adenopathy. Does not bruise/bleed easily.  Psychiatric/Behavioral: Negative for dysphoric mood. The patient is not nervous/anxious.          Objective:   Physical Exam  Constitutional: He appears well-developed and well-nourished. No distress.  HENT:  Head: Normocephalic and atraumatic.  Mouth/Throat: Oropharynx is clear and moist.  Eyes: Conjunctivae and EOM are normal. Pupils are equal, round, and reactive to light.  Neck: Normal range of motion. Neck supple. No JVD present. Carotid bruit is not present. No thyromegaly present.  Cardiovascular: Normal rate, regular rhythm, normal heart sounds and intact distal pulses.  Exam reveals no gallop.   Pulmonary/Chest: Effort normal and breath sounds normal. No respiratory distress. He has no wheezes.  Abdominal: Soft. Bowel sounds are  normal. He exhibits no distension and no abdominal bruit. There is no tenderness.  Musculoskeletal: He exhibits no edema.  Lymphadenopathy:    He has no cervical adenopathy.  Neurological: He is alert. He has normal reflexes. He exhibits normal muscle tone. Coordination normal.  Skin: Skin is warm and dry. No rash noted. No erythema. No pallor.  Psychiatric: He has a normal mood and affect.          Assessment & Plan:

## 2011-10-13 NOTE — Assessment & Plan Note (Signed)
bp in fair control at this time  No changes needed  Disc lifstyle change with low sodium diet and exercise   Check renal panel today- following K

## 2011-10-13 NOTE — Patient Instructions (Addendum)
Labs today for your potassium and blood pressure Blood pressure is good  No change in medicines  Don't forget to go to the health dept for your Tdap vaccine  Follow up in 6 months with labs prior for annual exam

## 2012-01-08 ENCOUNTER — Other Ambulatory Visit: Payer: Self-pay | Admitting: Cardiology

## 2012-01-08 NOTE — Telephone Encounter (Signed)
..   Requested Prescriptions   Pending Prescriptions Disp Refills  . trandolapril (MAVIK) 2 MG tablet [Pharmacy Med Name: TRANDOLAPRIL 2 MG TABLET] 90 tablet 0    Sig: TAKE 1 TABLET BY MOUTH ONCE DAILY WITH 1MG  TABLET

## 2012-06-02 ENCOUNTER — Telehealth: Payer: Self-pay

## 2012-06-02 DIAGNOSIS — Z8546 Personal history of malignant neoplasm of prostate: Secondary | ICD-10-CM

## 2012-06-02 NOTE — Telephone Encounter (Signed)
Here is order for future lab for psa

## 2012-06-02 NOTE — Telephone Encounter (Signed)
Notified pt PSA test was added for his lab

## 2012-06-02 NOTE — Telephone Encounter (Signed)
Pt has not seen urologist recently and request PSA test done with scheduled labs for 06/04/12.Please advise.

## 2012-06-03 ENCOUNTER — Telehealth: Payer: Self-pay | Admitting: Family Medicine

## 2012-06-03 DIAGNOSIS — T148XXA Other injury of unspecified body region, initial encounter: Secondary | ICD-10-CM

## 2012-06-03 DIAGNOSIS — E7800 Pure hypercholesterolemia, unspecified: Secondary | ICD-10-CM

## 2012-06-03 DIAGNOSIS — I10 Essential (primary) hypertension: Secondary | ICD-10-CM

## 2012-06-03 DIAGNOSIS — D649 Anemia, unspecified: Secondary | ICD-10-CM

## 2012-06-03 DIAGNOSIS — I6529 Occlusion and stenosis of unspecified carotid artery: Secondary | ICD-10-CM

## 2012-06-03 DIAGNOSIS — E875 Hyperkalemia: Secondary | ICD-10-CM

## 2012-06-03 NOTE — Telephone Encounter (Signed)
Message copied by Judy Pimple on Thu Jun 03, 2012  7:04 PM ------      Message from: Alvina Chou      Created: Fri May 28, 2012 10:18 AM      Regarding: Lab orders for Friday 9-27       Patient is scheduled for CPX labs, please order future labs, Thanks , Camelia Eng

## 2012-06-04 ENCOUNTER — Other Ambulatory Visit (INDEPENDENT_AMBULATORY_CARE_PROVIDER_SITE_OTHER): Payer: Medicare Other

## 2012-06-04 DIAGNOSIS — Z8546 Personal history of malignant neoplasm of prostate: Secondary | ICD-10-CM

## 2012-06-04 DIAGNOSIS — E7800 Pure hypercholesterolemia, unspecified: Secondary | ICD-10-CM

## 2012-06-04 DIAGNOSIS — E875 Hyperkalemia: Secondary | ICD-10-CM

## 2012-06-04 DIAGNOSIS — I10 Essential (primary) hypertension: Secondary | ICD-10-CM

## 2012-06-04 DIAGNOSIS — D649 Anemia, unspecified: Secondary | ICD-10-CM

## 2012-06-04 DIAGNOSIS — T148XXA Other injury of unspecified body region, initial encounter: Secondary | ICD-10-CM

## 2012-06-04 LAB — CBC WITH DIFFERENTIAL/PLATELET
Basophils Absolute: 0 10*3/uL (ref 0.0–0.1)
Basophils Relative: 0.3 % (ref 0.0–3.0)
Eosinophils Absolute: 0.1 10*3/uL (ref 0.0–0.7)
Eosinophils Relative: 1.9 % (ref 0.0–5.0)
HCT: 43.4 % (ref 39.0–52.0)
Hemoglobin: 14.3 g/dL (ref 13.0–17.0)
Lymphocytes Relative: 14.2 % (ref 12.0–46.0)
Lymphs Abs: 1.1 10*3/uL (ref 0.7–4.0)
MCHC: 33 g/dL (ref 30.0–36.0)
MCV: 101.8 fl — ABNORMAL HIGH (ref 78.0–100.0)
Monocytes Absolute: 1.1 10*3/uL — ABNORMAL HIGH (ref 0.1–1.0)
Monocytes Relative: 13.6 % — ABNORMAL HIGH (ref 3.0–12.0)
Neutro Abs: 5.4 10*3/uL (ref 1.4–7.7)
Neutrophils Relative %: 70 % (ref 43.0–77.0)
Platelets: 294 10*3/uL (ref 150.0–400.0)
RBC: 4.26 Mil/uL (ref 4.22–5.81)
RDW: 13 % (ref 11.5–14.6)
WBC: 7.8 10*3/uL (ref 4.5–10.5)

## 2012-06-04 LAB — LIPID PANEL
Cholesterol: 144 mg/dL (ref 0–200)
HDL: 41.9 mg/dL (ref >39.00)
LDL Cholesterol: 83 mg/dL (ref 0–99)
Total CHOL/HDL Ratio: 3
Triglycerides: 97 mg/dL (ref 0.0–149.0)
VLDL: 19.4 mg/dL (ref 0.0–40.0)

## 2012-06-04 LAB — COMPREHENSIVE METABOLIC PANEL
ALT: 15 U/L (ref 0–53)
AST: 22 U/L (ref 0–37)
Albumin: 3.7 g/dL (ref 3.5–5.2)
Alkaline Phosphatase: 51 U/L (ref 39–117)
BUN: 22 mg/dL (ref 6–23)
CO2: 27 mEq/L (ref 19–32)
Calcium: 9.1 mg/dL (ref 8.4–10.5)
Chloride: 102 mEq/L (ref 96–112)
Creatinine, Ser: 1.1 mg/dL (ref 0.4–1.5)
GFR: 68.24 mL/min (ref >60.00)
Glucose, Bld: 95 mg/dL (ref 70–99)
Potassium: 4.7 mEq/L (ref 3.5–5.1)
Sodium: 135 mEq/L (ref 135–145)
Total Bilirubin: 0.7 mg/dL (ref 0.3–1.2)
Total Protein: 6.5 g/dL (ref 6.0–8.3)

## 2012-06-04 LAB — TSH: TSH: 6.94 u[IU]/mL — ABNORMAL HIGH (ref 0.35–5.50)

## 2012-06-04 LAB — PSA: PSA: 0.29 ng/mL (ref 0.10–4.00)

## 2012-06-05 LAB — VITAMIN D 25 HYDROXY (VIT D DEFICIENCY, FRACTURES): Vit D, 25-Hydroxy: 31 ng/mL (ref 30–89)

## 2012-06-11 ENCOUNTER — Encounter: Payer: Medicare Other | Admitting: Family Medicine

## 2012-06-14 ENCOUNTER — Ambulatory Visit (INDEPENDENT_AMBULATORY_CARE_PROVIDER_SITE_OTHER): Payer: Medicare Other | Admitting: Family Medicine

## 2012-06-14 ENCOUNTER — Encounter: Payer: Self-pay | Admitting: Family Medicine

## 2012-06-14 VITALS — BP 128/58 | HR 64 | Temp 97.9°F | Ht 64.0 in | Wt 153.2 lb

## 2012-06-14 DIAGNOSIS — M899 Disorder of bone, unspecified: Secondary | ICD-10-CM

## 2012-06-14 DIAGNOSIS — M8580 Other specified disorders of bone density and structure, unspecified site: Secondary | ICD-10-CM

## 2012-06-14 DIAGNOSIS — Z79899 Other long term (current) drug therapy: Secondary | ICD-10-CM

## 2012-06-14 DIAGNOSIS — D539 Nutritional anemia, unspecified: Secondary | ICD-10-CM

## 2012-06-14 DIAGNOSIS — I10 Essential (primary) hypertension: Secondary | ICD-10-CM

## 2012-06-14 DIAGNOSIS — I6529 Occlusion and stenosis of unspecified carotid artery: Secondary | ICD-10-CM

## 2012-06-14 DIAGNOSIS — E039 Hypothyroidism, unspecified: Secondary | ICD-10-CM | POA: Insufficient documentation

## 2012-06-14 DIAGNOSIS — M949 Disorder of cartilage, unspecified: Secondary | ICD-10-CM

## 2012-06-14 DIAGNOSIS — Z23 Encounter for immunization: Secondary | ICD-10-CM

## 2012-06-14 DIAGNOSIS — E7800 Pure hypercholesterolemia, unspecified: Secondary | ICD-10-CM

## 2012-06-14 DIAGNOSIS — R7989 Other specified abnormal findings of blood chemistry: Secondary | ICD-10-CM

## 2012-06-14 DIAGNOSIS — Z8546 Personal history of malignant neoplasm of prostate: Secondary | ICD-10-CM

## 2012-06-14 NOTE — Progress Notes (Signed)
Subjective:    Patient ID: [name], male    DOB: [birth year], 78 y.o.   MRN: [id number]  HPI Here for check up of chronic medical conditions and to review health mt list   L leg started to bother him again  2-3 weeks Hx of ? Osteomyelitis  Started bothering him again No pain in it unless he puts wt on it  Most of pain is in the calf    Wt is stable with bmi of 26  Td due--wants to get that   F/u shot--had that this month- 1 week ago   colonosc 5/04  On zocor for lipids Lab Results  Component Value Date   CHOL 144 06/04/2012   CHOL 148 06/05/2011   CHOL 138 07/18/2010   Lab Results  Component Value Date   HDL 41.90 06/04/2012   HDL 40.98 06/05/2011   HDL 11.91* 07/18/2010   Lab Results  Component Value Date   LDLCALC 83 06/04/2012   LDLCALC 89 06/05/2011   LDLCALC 79 07/18/2010   Lab Results  Component Value Date   TRIG 97.0 06/04/2012   TRIG 78.0 06/05/2011   TRIG 104.0 07/18/2010   Lab Results  Component Value Date   CHOLHDL 3 06/04/2012   CHOLHDL 3 06/05/2011   CHOLHDL 4 07/18/2010   No results found for this basename: LDLDIRECT  very well controlled with statin and diet      Hx of remote prostate ca Lab Results  Component Value Date   PSA 0.29 06/04/2012   PSA 0.31 06/05/2011   PSA 0.18 02/21/2009   no change No symptoms or problems- prostatectomy in past   tsh is up  Lab Results  Component Value Date   TSH 6.94* 06/04/2012   no fatigue or skin or hair changes   mcv remains elevated  Lab Results  Component Value Date   WBC 7.8 06/04/2012   HGB 14.3 06/04/2012   HCT 43.4 06/04/2012   MCV 101.8* 06/04/2012   PLT 294.0 06/04/2012   does drink alcohol 1-2 drinks per day  Has osteopenia  dexa 1/12 in hip Has comp fx spinal  Vit D level 31- he takes one pill per day with D3 mag and ca   bp is stable today  No cp or palpitations or headaches or edema  No side effects to medicines  BP Readings from Last 3 Encounters:  06/14/12 128/58    10/13/11 130/64  08/13/11 140/64      Patient Active Problem List  Diagnosis  . HYPERCHOLESTEROLEMIA  . MACROCYTIC ANEMIA  . ANEMIA, MILD  . HYPERTENSION  . CAROTID ARTERY STENOSIS  . SPONDYLOSIS, LUMBAR  . NECK PAIN, CHRONIC  . BACK PAIN, CHRONIC  . KYPHOSIS  . URINARY FREQUENCY  . FECAL OCCULT BLOOD  . COMPRESSION FRACTURE  . NEOPLASM, MALIGNANT, PROSTATE, HX OF  . Hyperkalemia  . Osteopenia  . Elevated TSH   Past Medical History  Diagnosis Date  . Hypertension   . Carotid artery stenosis   . Hyperlipidemia   . History of prostate cancer   . Degenerative disc disease   . Kyphosis   . Fractures     compression  . Non-cardiac chest pain 04/11    hospital ? [facility]  . Cancer     prostate   Past Surgical History  Procedure Date  . Prostate surgery   . Herniated disc surgery 1990    L3-4  . Inguinal hernia repair 1979    right  .  Tonsillectomy   . Prostate cancer surgery   . Cardiovascular stress test 2004    cardiolite negative  . Carotid doppler 11/05    0-39% bilatteral   History  Substance Use Topics  . Smoking status: Never Smoker   . Smokeless tobacco: Not on file  . Alcohol Use: Yes     2 drinks per day   Family History  Problem Relation Age of Onset  . Hypertension Father    No Known Allergies Current Outpatient Prescriptions on File Prior to Visit  Medication Sig Dispense Refill  . aspirin EC 81 MG EC tablet Take 81 mg by mouth daily.        . Loratadine 10 MG CAPS Take by mouth as needed.      . Selenium (SELENIMIN-200 PO) Take by mouth daily.        . simvastatin (ZOCOR) 20 MG tablet TAKE 1 TABLET BY MOUTH AT BEDTIME  90 tablet  3  . trandolapril (MAVIK) 2 MG tablet TAKE 1 TABLET BY MOUTH ONCE DAILY WITH 1MG  TABLET  90 tablet  0  . vitamin B-12 (CYANOCOBALAMIN) 100 MCG tablet Take 100 mcg by mouth. 3 times a week      . VITAMIN D, CHOLECALCIFEROL, PO Take 1 tablet by mouth daily.          Review of Systems Review of Systems   Constitutional: Negative for fever, appetite change, fatigue and unexpected weight change.  Eyes: Negative for pain and visual disturbance.  Respiratory: Negative for cough and shortness of breath.   Cardiovascular: Negative for cp or palpitations    Gastrointestinal: Negative for nausea, diarrhea and constipation.  Genitourinary: Negative for urgency and pain  Skin: Negative for pallor or rash   Neurological: Negative for weakness, light-headedness, numbness and headaches.  Hematological: Negative for adenopathy. Does not bruise/bleed easily.  Psychiatric/Behavioral: Negative for dysphoric mood. The patient is not nervous/anxious.         Objective:   Physical Exam  Constitutional: He appears well-developed and well-nourished. No distress.       Elderly kyphotic male in no distress   HENT:  Head: Normocephalic and atraumatic.  Right Ear: External ear normal.  Left Ear: External ear normal.  Nose: Nose normal.  Mouth/Throat: Oropharynx is clear and moist.  Eyes: Conjunctivae normal and EOM are normal. Pupils are equal, round, and reactive to light. Right eye exhibits no discharge. Left eye exhibits no discharge. No scleral icterus.  Neck: Normal range of motion. Neck supple. No JVD present. Carotid bruit is present. No thyromegaly present.  Cardiovascular: Normal rate, regular rhythm, normal heart sounds and intact distal pulses.  Exam reveals no gallop.   No murmur heard. Pulmonary/Chest: Effort normal and breath sounds normal. No respiratory distress. He has no wheezes.  Abdominal: Soft. Bowel sounds are normal. He exhibits no distension and no mass. There is no tenderness.  Musculoskeletal: Normal range of motion. He exhibits no edema and no tenderness.       Kyphosis and changes of OA noted   Lymphadenopathy:    He has no cervical adenopathy.  Neurological: He is alert. He has normal reflexes. No cranial nerve deficit. He exhibits normal muscle tone. Coordination normal.        No tremor   Skin: Skin is warm and dry. No rash noted. No erythema. No pallor.       sks throughout  Psychiatric: He has a normal mood and affect.  Assessment & Plan:

## 2012-06-14 NOTE — Patient Instructions (Addendum)
Tetanus shot today Take at least 2000 iu of vitamin D 3 daily  I want you to cut back on alcohol to 1 drink a day or less - less is better (optimal would be none) Your thyroid test was a bit off- I am going to re check this today  For your left leg pain - I suggest you follow up with Dr Charlett Blake or one of her partners You are due for your 2 year check of carotid arteries- we will schedule that

## 2012-06-15 LAB — T4, FREE: Free T4: 0.6 ng/dL (ref 0.60–1.60)

## 2012-06-15 LAB — TSH: TSH: 5.12 u[IU]/mL (ref 0.35–5.50)

## 2012-06-16 ENCOUNTER — Encounter (INDEPENDENT_AMBULATORY_CARE_PROVIDER_SITE_OTHER): Payer: Medicare Other

## 2012-06-16 DIAGNOSIS — I6529 Occlusion and stenosis of unspecified carotid artery: Secondary | ICD-10-CM

## 2012-06-17 NOTE — Assessment & Plan Note (Signed)
bp in fair control at this time  No changes needed  Disc lifstyle change with low sodium diet and exercise  Labs reviewed  

## 2012-06-17 NOTE — Assessment & Plan Note (Signed)
Due dexa in 2014 Rev ca and D  Rev fx risk and safety and need for exercise No hx of fragility fractures Has significant kyphosis Adv to quit alcohol

## 2012-06-17 NOTE — Assessment & Plan Note (Signed)
Check thyroid profile today No symptoms or change in exam

## 2012-06-17 NOTE — Assessment & Plan Note (Signed)
?   If inc MCV has to do with alcohol intake Is chronic Adv to cut down or opt quit alcohol

## 2012-06-17 NOTE — Assessment & Plan Note (Signed)
S/p prostatectomy psa remains low and stable No problems

## 2012-06-17 NOTE — Assessment & Plan Note (Signed)
Lipids well controlled with statin and diet Disc goals for lipids and reasons to control them Rev labs with pt Rev low sat fat diet in detail

## 2012-06-17 NOTE — Assessment & Plan Note (Signed)
Ref for his 2 year f/u doppler No change in exam No symptoms

## 2012-07-22 ENCOUNTER — Telehealth: Payer: Self-pay | Admitting: Family Medicine

## 2012-07-22 NOTE — Telephone Encounter (Signed)
I cannot call him in anything without a visit and lung exam  I do recommend mucinex DM otc - would be ok for him

## 2012-07-22 NOTE — Telephone Encounter (Signed)
Caller: Morgan/Patient; Patient Name: [name]; PCP: Roxy Manns [facility]); Best Callback Phone Number: [phone] Calling regarding cough and congestion that started 07/07/12. Has been taking Robitussin and cough drops, thinks he needs something stronger, afebrile. States he feels well other than the cough/congestion. Emergent signs and symptoms ruled out as per Upper Respiratory Infection protocol except for see in 24 hours due to symptoms have not improved in 14 days of home care. Pt declined an appt and wants to know if Dr. Milinda Antis would call in something for the cough or recommend something OTC, RN did not advise due to HTN. PLEASE CALL PT.

## 2012-07-22 NOTE — Telephone Encounter (Signed)
Spoke with pt and he decided to come in for appt. appt scheduled tomorrow at [facility]

## 2012-07-23 ENCOUNTER — Encounter: Payer: Self-pay | Admitting: Family Medicine

## 2012-07-23 ENCOUNTER — Ambulatory Visit (INDEPENDENT_AMBULATORY_CARE_PROVIDER_SITE_OTHER): Payer: Medicare Other | Admitting: Family Medicine

## 2012-07-23 VITALS — BP 128/58 | HR 72 | Temp 98.1°F | Ht 64.0 in | Wt 154.0 lb

## 2012-07-23 DIAGNOSIS — J069 Acute upper respiratory infection, unspecified: Secondary | ICD-10-CM | POA: Insufficient documentation

## 2012-07-23 NOTE — Patient Instructions (Addendum)
I think you have a head and chest cold that is getting better  Continue the mucinex DM for congestion and cough as needed  Make sure to drink lots of fluids Update if not starting to improve in a week or if worsening

## 2012-07-23 NOTE — Progress Notes (Signed)
Subjective:    Patient ID: [name], male    DOB: 1920/06/22, 78 y.o.   MRN: [id number]  HPI Here for cough and congestion -- and called nurse line yesterday  mucinex DM- started yesterday- really helped - this does not make him dizzy/ off balance / or sleepy  2 weeks ago - st , developed into a cold and cough  Cough was prod -- mucous had a little color at first- now clear and less No wheezing   No sinus pain  No fever   Patient Active Problem List  Diagnosis  . HYPERCHOLESTEROLEMIA  . MACROCYTIC ANEMIA  . ANEMIA, MILD  . HYPERTENSION  . CAROTID ARTERY STENOSIS  . SPONDYLOSIS, LUMBAR  . NECK PAIN, CHRONIC  . BACK PAIN, CHRONIC  . KYPHOSIS  . URINARY FREQUENCY  . FECAL OCCULT BLOOD  . COMPRESSION FRACTURE  . NEOPLASM, MALIGNANT, PROSTATE, HX OF  . Hyperkalemia  . Osteopenia  . Elevated TSH   Past Medical History  Diagnosis Date  . Hypertension   . Carotid artery stenosis   . Hyperlipidemia   . History of prostate cancer   . Degenerative disc disease   . Kyphosis   . Fractures     compression  . Non-cardiac chest pain 04/11    hospital ? [facility]  . Cancer     prostate   Past Surgical History  Procedure Date  . Prostate surgery   . Herniated disc surgery 1990    L3-4  . Inguinal hernia repair 1979    right  . Tonsillectomy   . Prostate cancer surgery   . Cardiovascular stress test 2004    cardiolite negative  . Carotid doppler 11/05    0-39% bilatteral   History  Substance Use Topics  . Smoking status: Never Smoker   . Smokeless tobacco: Not on file  . Alcohol Use: Yes     Comment: 2 drinks per day   Family History  Problem Relation Age of Onset  . Hypertension Father    No Known Allergies Current Outpatient Prescriptions on File Prior to Visit  Medication Sig Dispense Refill  . aspirin EC 81 MG EC tablet Take 81 mg by mouth daily.        . Loratadine 10 MG CAPS Take by mouth as needed.      . Selenium (SELENIMIN-200 PO) Take by mouth  daily.        . simvastatin (ZOCOR) 20 MG tablet TAKE 1 TABLET BY MOUTH AT BEDTIME  90 tablet  3  . trandolapril (MAVIK) 2 MG tablet TAKE 1 TABLET BY MOUTH ONCE DAILY WITH 1MG  TABLET  90 tablet  0  . vitamin B-12 (CYANOCOBALAMIN) 100 MCG tablet Take 100 mcg by mouth. 3 times a week      . VITAMIN D, CHOLECALCIFEROL, PO Take 1 tablet by mouth daily.              Review of Systems Review of Systems  Constitutional: Negative for fever, appetite change,  and unexpected weight change.  Eyes: Negative for pain and visual disturbance.  ENt pos for cong and ST and post nasal drip/ neg for sinus pain  Respiratory: Negative for wheeze and shortness of breath.   Cardiovascular: Negative for cp or palpitations    Gastrointestinal: Negative for nausea, diarrhea and constipation.  Genitourinary: Negative for urgency and frequency.  Skin: Negative for pallor or rash   Neurological: Negative for weakness, light-headedness, numbness and headaches.  Hematological: Negative for adenopathy.  Does not bruise/bleed easily.  Psychiatric/Behavioral: Negative for dysphoric mood. The patient is not nervous/anxious.         Objective:   Physical Exam  Constitutional: He appears well-developed and well-nourished. No distress.  HENT:  Head: Normocephalic and atraumatic.  Right Ear: External ear normal.  Left Ear: External ear normal.  Mouth/Throat: Oropharynx is clear and moist. No oropharyngeal exudate.       Nares are injected and congested  Clear post nasal drip No sinus tenderness  Eyes: Conjunctivae normal and EOM are normal. Pupils are equal, round, and reactive to light. Right eye exhibits no discharge. Left eye exhibits no discharge.  Neck: Normal range of motion. Neck supple.  Cardiovascular: Normal rate, regular rhythm and normal heart sounds.   Pulmonary/Chest: Effort normal and breath sounds normal. No respiratory distress. He has no wheezes. He has no rales.  Lymphadenopathy:    He has no  cervical adenopathy.  Neurological: He is alert.  Skin: Skin is warm and dry. No rash noted.  Psychiatric: He has a normal mood and affect.          Assessment & Plan:

## 2012-07-23 NOTE — Assessment & Plan Note (Signed)
Symptoms much improved with mucinex DM - and reassuring exam Disc s/s to watch for  Disc symptomatic care - see instructions on AVS  Update if not starting to improve in a week or if worsening

## 2012-10-22 ENCOUNTER — Other Ambulatory Visit: Payer: Self-pay | Admitting: Family Medicine

## 2013-02-21 ENCOUNTER — Other Ambulatory Visit: Payer: Self-pay | Admitting: Family Medicine

## 2013-02-21 NOTE — Telephone Encounter (Signed)
Electronic refill request, no recent/future appts., please advise  

## 2013-02-21 NOTE — Telephone Encounter (Signed)
Please refill for 6 months, thanks  

## 2013-02-22 NOTE — Telephone Encounter (Signed)
done

## 2013-04-04 ENCOUNTER — Encounter: Payer: Self-pay | Admitting: Family Medicine

## 2013-04-04 ENCOUNTER — Ambulatory Visit (INDEPENDENT_AMBULATORY_CARE_PROVIDER_SITE_OTHER): Payer: Medicare Other | Admitting: Family Medicine

## 2013-04-04 VITALS — BP 112/64 | HR 55 | Temp 98.2°F | Ht 64.0 in | Wt 155.2 lb

## 2013-04-04 DIAGNOSIS — T753XXA Motion sickness, initial encounter: Secondary | ICD-10-CM

## 2013-04-04 MED ORDER — SCOPOLAMINE 1 MG/3DAYS TD PT72: 1.0000 | MEDICATED_PATCH | TRANSDERMAL | Status: DC

## 2013-04-04 NOTE — Patient Instructions (Addendum)
Schedule PE in early nov please (30 min appt) with labs prior Try the scopalamine patch for sea sickness as needed -hopefully you will not need it for the trip

## 2013-04-04 NOTE — Progress Notes (Signed)
  Subjective:    Patient ID: [name], male    DOB: 1919/10/24, 79 y.o.   MRN: [id number]  HPI Here to discuss travel  Will be taking a cruise to [place]   Has been on boats before  He is not in general prone to sea sickness   Was wondering if he should use a medicine for sea sickness   He has had prostate surgery in the past  He leaks a bit but no retention   Has been doing well overall  No new complaints  He had a bout of bronchitis  Wants to know if he needs to return to the cardiologist Wants to know if he needs labs for HTN or cholesterol  Lab Results  Component Value Date   CHOL 144 06/04/2012   HDL 41.90 06/04/2012   LDLCALC 83 06/04/2012   TRIG 97.0 06/04/2012   CHOLHDL 3 06/04/2012      Chemistry      Component Value Date/Time   NA 135 06/04/2012 0834   K 4.7 06/04/2012 0834   CL 102 06/04/2012 0834   CO2 27 06/04/2012 0834   BUN 22 06/04/2012 0834   CREATININE 1.1 06/04/2012 0834      Component Value Date/Time   CALCIUM 9.1 06/04/2012 0834   ALKPHOS 51 06/04/2012 0834   AST 22 06/04/2012 0834   ALT 15 06/04/2012 0834   BILITOT 0.7 06/04/2012 0834      Lab Results  Component Value Date   TSH 5.12 06/14/2012    Lab Results  Component Value Date   WBC 7.8 06/04/2012   HGB 14.3 06/04/2012   HCT 43.4 06/04/2012   MCV 101.8* 06/04/2012   PLT 294.0 06/04/2012      Review of Systems     Objective:   Physical Exam  Constitutional: He appears well-developed and well-nourished. No distress.  HENT:  Head: Normocephalic and atraumatic.  Right Ear: External ear normal.  Left Ear: External ear normal.  Mouth/Throat: Oropharynx is clear and moist.  Clear TMs with scant cerumen  Eyes: Conjunctivae and EOM are normal. Pupils are equal, round, and reactive to light.  No nystagmus  Neck: Normal range of motion. Neck supple. No JVD present. Carotid bruit is not present. No thyromegaly present.  Cardiovascular: Normal rate, regular rhythm and intact distal pulses.   Exam reveals no gallop.   Pulmonary/Chest: Effort normal and breath sounds normal. No respiratory distress. He has no wheezes.  Musculoskeletal: He exhibits no edema.  Kyphosis noted  Lymphadenopathy:    He has no cervical adenopathy.  Neurological: He is alert. He has normal reflexes. No cranial nerve deficit. He exhibits normal muscle tone. Coordination normal.  Skin: Skin is warm and dry. No rash noted. No erythema. No pallor.  Psychiatric: He has a normal mood and affect.          Assessment & Plan:

## 2013-04-04 NOTE — Assessment & Plan Note (Signed)
Upcoming cruise and pt is worried about potentially becoming motion sick (although it was not a problem in the past) Disc ways to avoid it Px scopalamine patch (72 hour )- and warned that this could cause sedation and dry mouth - he voiced understanding and will only use if needed  Of note-no BPH/ urinary hesitancy and no glaucoma known

## 2013-05-10 ENCOUNTER — Encounter: Payer: Self-pay | Admitting: Family Medicine

## 2013-05-10 ENCOUNTER — Ambulatory Visit (INDEPENDENT_AMBULATORY_CARE_PROVIDER_SITE_OTHER): Payer: Medicare Other | Admitting: Family Medicine

## 2013-05-10 VITALS — BP 122/62 | HR 72 | Temp 98.4°F | Ht 64.0 in | Wt 157.8 lb

## 2013-05-10 DIAGNOSIS — L259 Unspecified contact dermatitis, unspecified cause: Secondary | ICD-10-CM | POA: Insufficient documentation

## 2013-05-10 MED ORDER — MOMETASONE FUROATE 0.1 % EX CREA: TOPICAL_CREAM | Freq: Every day | CUTANEOUS | Status: DC

## 2013-05-10 NOTE — Patient Instructions (Addendum)
Keep area of rash clean and dry  Use the elocon cream daily for itch and redness If area gets worse/ redness / pus drainage  Cool compress can help itch

## 2013-05-10 NOTE — Assessment & Plan Note (Signed)
Right ankle with 4 erythematous papules resembling insect bites or more likely plant dermatitis Given elocon cream to use daily  Disc keeping area cool and dry Will watch for s/s of infection

## 2013-05-10 NOTE — Progress Notes (Signed)
Subjective:    Patient ID: [name], male    DOB: Apr 27, 1920, 79 y.o.   MRN: [id number]  HPI Here for L foot and ankle itching  Started a few days ago  Thought it may be poison ivy -- he has been outside a bit  No pain - but the area is a bit sensitive  No oozing or skin breakdown  Used calamine lotion He is worried about possible cellulitis   Going on a cruise on Tuesday and wanted to take a look before going   Patient Active Problem List   Diagnosis Date Noted  . Motion sickness 04/04/2013  . Osteopenia 06/14/2012  . Elevated TSH 06/14/2012  . Hyperkalemia 06/11/2011  . MACROCYTIC ANEMIA 07/29/2010  . 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 FECAL OCCULT BLOOD 02/01/2010  . ANEMIA, MILD 01/23/2010  . HYPERTENSION 02/23/2009  . CAROTID ARTERY STENOSIS 02/23/2009  . SPONDYLOSIS, LUMBAR 02/23/2009  . NECK PAIN, CHRONIC 02/23/2009  . BACK PAIN, CHRONIC 02/23/2009  . COMPRESSION FRACTURE 02/23/2009  . HYPERCHOLESTEROLEMIA 02/21/2009  . NEOPLASM, MALIGNANT, PROSTATE, HX OF 02/21/2009   Past Medical History  Diagnosis Date  . Hypertension   . Carotid artery stenosis   . Hyperlipidemia   . History of prostate cancer   . Degenerative disc disease   . Kyphosis   . Fractures     compression  . Non-cardiac chest pain 04/11    hospital ? [facility]  . Cancer     prostate   Past Surgical History  Procedure Laterality Date  . Prostate surgery    . Herniated disc surgery  1990    L3-4  . Inguinal hernia repair  1979    right  . Tonsillectomy    . Prostate cancer surgery    . Cardiovascular stress test  2004    cardiolite negative  . Carotid doppler  11/05    0-39% bilatteral   History  Substance Use Topics  . Smoking status: Never Smoker   . Smokeless tobacco: Not on file  . Alcohol Use: Yes     Comment: 2 drinks per day   Family History  Problem Relation Age of Onset  . Hypertension Father    No Known Allergies Current Outpatient Prescriptions on File Prior to Visit   Medication Sig Dispense Refill  . aspirin EC 81 MG EC tablet Take 81 mg by mouth daily.        Marland Kitchen guaiFENesin (MUCINEX) 600 MG 12 hr tablet Take 1,200 mg by mouth as needed.      . Loratadine 10 MG CAPS Take by mouth as needed.      Marland Kitchen scopolamine (TRANSDERM-SCOP) 1.5 MG Place 1 patch (1.5 mg total) onto the skin every 3 (three) days. As needed for sea sickness  10 patch  0  . Selenium (SELENIMIN-200 PO) Take by mouth daily.        . simvastatin (ZOCOR) 20 MG tablet TAKE 1 TABLET BY MOUTH AT BEDTIME  90 tablet  1  . trandolapril (MAVIK) 2 MG tablet TAKE 1 TABLET BY MOUTH EVERY DAY  90 tablet  1  . vitamin B-12 (CYANOCOBALAMIN) 100 MCG tablet Take 100 mcg by mouth. 3 times a week      . VITAMIN D, CHOLECALCIFEROL, PO Take 1 tablet by mouth daily.        . vitamin E 100 UNIT capsule Take 100 Units by mouth 3 (three) times a week.       No current facility-administered medications on file prior  to visit.    Review of Systems Review of Systems  Constitutional: Negative for fever, appetite change, fatigue and unexpected weight change.  Eyes: Negative for pain and visual disturbance.  Respiratory: Negative for cough and shortness of breath.   Cardiovascular: Negative for cp or palpitations    Gastrointestinal: Negative for nausea, diarrhea and constipation.  Genitourinary: Negative for urgency and frequency.  Skin: Negative for pallor and pos for itchy rash Neurological: Negative for weakness, light-headedness, numbness and headaches.  Hematological: Negative for adenopathy. Does not bruise/bleed easily.  Psychiatric/Behavioral: Negative for dysphoric mood. The patient is not nervous/anxious.         Objective:   Physical Exam  Constitutional: He appears well-developed and well-nourished. No distress.  Eyes: Conjunctivae and EOM are normal. Pupils are equal, round, and reactive to light. Right eye exhibits no discharge. Left eye exhibits no discharge.  Neck: Normal range of motion. Neck  supple.  Pulmonary/Chest: Effort normal and breath sounds normal. He has no wheezes.  Lymphadenopathy:    He has no cervical adenopathy.  Neurological: He is alert.  Skin: Skin is warm and dry. Rash noted.  4 papules on medial R ankle -erythematous and non oozing  Resemble plant dermatitis or insect bites No streaks or swelling   Psychiatric: He has a normal mood and affect.          Assessment & Plan:

## 2013-05-19 ENCOUNTER — Other Ambulatory Visit: Payer: Self-pay | Admitting: Family Medicine

## 2013-07-20 ENCOUNTER — Telehealth: Payer: Self-pay | Admitting: Family Medicine

## 2013-07-20 DIAGNOSIS — Z8546 Personal history of malignant neoplasm of prostate: Secondary | ICD-10-CM

## 2013-07-20 DIAGNOSIS — I10 Essential (primary) hypertension: Secondary | ICD-10-CM

## 2013-07-20 DIAGNOSIS — M8580 Other specified disorders of bone density and structure, unspecified site: Secondary | ICD-10-CM

## 2013-07-20 DIAGNOSIS — D649 Anemia, unspecified: Secondary | ICD-10-CM

## 2013-07-20 DIAGNOSIS — E875 Hyperkalemia: Secondary | ICD-10-CM

## 2013-07-20 DIAGNOSIS — E7800 Pure hypercholesterolemia, unspecified: Secondary | ICD-10-CM

## 2013-07-20 NOTE — Telephone Encounter (Signed)
Message copied by Judy Pimple on Wed Jul 20, 2013 10:07 PM ------      Message from: Alvina Chou      Created: Tue Jul 12, 2013  4:17 PM      Regarding: Lab orders for Thursday, 11.13.14       Patient is scheduled for CPX labs, please order future labs, Thanks , Terri       ------

## 2013-07-21 ENCOUNTER — Encounter: Payer: Self-pay | Admitting: Family Medicine

## 2013-07-21 ENCOUNTER — Other Ambulatory Visit (INDEPENDENT_AMBULATORY_CARE_PROVIDER_SITE_OTHER): Payer: Medicare Other

## 2013-07-21 DIAGNOSIS — R946 Abnormal results of thyroid function studies: Secondary | ICD-10-CM

## 2013-07-21 DIAGNOSIS — D649 Anemia, unspecified: Secondary | ICD-10-CM

## 2013-07-21 DIAGNOSIS — R7989 Other specified abnormal findings of blood chemistry: Secondary | ICD-10-CM

## 2013-07-21 DIAGNOSIS — E7800 Pure hypercholesterolemia, unspecified: Secondary | ICD-10-CM

## 2013-07-21 DIAGNOSIS — I10 Essential (primary) hypertension: Secondary | ICD-10-CM

## 2013-07-21 DIAGNOSIS — Z8546 Personal history of malignant neoplasm of prostate: Secondary | ICD-10-CM

## 2013-07-21 DIAGNOSIS — E875 Hyperkalemia: Secondary | ICD-10-CM

## 2013-07-21 DIAGNOSIS — M8580 Other specified disorders of bone density and structure, unspecified site: Secondary | ICD-10-CM

## 2013-07-21 DIAGNOSIS — D539 Nutritional anemia, unspecified: Secondary | ICD-10-CM

## 2013-07-21 DIAGNOSIS — M899 Disorder of bone, unspecified: Secondary | ICD-10-CM

## 2013-07-21 LAB — COMPREHENSIVE METABOLIC PANEL
ALT: 21 U/L (ref 0–53)
AST: 27 U/L (ref 0–37)
Albumin: 3.9 g/dL (ref 3.5–5.2)
Alkaline Phosphatase: 50 U/L (ref 39–117)
BUN: 25 mg/dL — ABNORMAL HIGH (ref 6–23)
CO2: 29 mEq/L (ref 19–32)
Calcium: 9.3 mg/dL (ref 8.4–10.5)
Chloride: 101 mEq/L (ref 96–112)
Creatinine, Ser: 1.1 mg/dL (ref 0.4–1.5)
GFR: 65.27 mL/min (ref >60.00)
Glucose, Bld: 105 mg/dL — ABNORMAL HIGH (ref 70–99)
Potassium: 4.7 mEq/L (ref 3.5–5.1)
Sodium: 135 mEq/L (ref 135–145)
Total Bilirubin: 0.7 mg/dL (ref 0.3–1.2)
Total Protein: 6.7 g/dL (ref 6.0–8.3)

## 2013-07-21 LAB — CBC WITH DIFFERENTIAL/PLATELET
Basophils Absolute: 0 10*3/uL (ref 0.0–0.1)
Basophils Relative: 0.3 % (ref 0.0–3.0)
Eosinophils Absolute: 0.2 10*3/uL (ref 0.0–0.7)
Eosinophils Relative: 2.1 % (ref 0.0–5.0)
HCT: 42.9 % (ref 39.0–52.0)
Hemoglobin: 14.7 g/dL (ref 13.0–17.0)
Lymphocytes Relative: 17.1 % (ref 12.0–46.0)
Lymphs Abs: 1.5 10*3/uL (ref 0.7–4.0)
MCHC: 34.3 g/dL (ref 30.0–36.0)
MCV: 98.7 fl (ref 78.0–100.0)
Monocytes Absolute: 1.1 10*3/uL — ABNORMAL HIGH (ref 0.1–1.0)
Monocytes Relative: 12.5 % — ABNORMAL HIGH (ref 3.0–12.0)
Neutro Abs: 6 10*3/uL (ref 1.4–7.7)
Neutrophils Relative %: 68 % (ref 43.0–77.0)
Platelets: 323 10*3/uL (ref 150.0–400.0)
RBC: 4.35 Mil/uL (ref 4.22–5.81)
RDW: 12.7 % (ref 11.5–14.6)
WBC: 8.9 10*3/uL (ref 4.5–10.5)

## 2013-07-21 LAB — TSH: TSH: 8.27 u[IU]/mL — ABNORMAL HIGH (ref 0.35–5.50)

## 2013-07-21 LAB — LIPID PANEL
Cholesterol: 149 mg/dL (ref 0–200)
HDL: 45 mg/dL (ref >39.00)
LDL Cholesterol: 85 mg/dL (ref 0–99)
Total CHOL/HDL Ratio: 3
Triglycerides: 96 mg/dL (ref 0.0–149.0)
VLDL: 19.2 mg/dL (ref 0.0–40.0)

## 2013-07-21 LAB — PSA: PSA: 0.44 ng/mL (ref 0.10–4.00)

## 2013-07-22 LAB — VITAMIN D 25 HYDROXY (VIT D DEFICIENCY, FRACTURES): Vit D, 25-Hydroxy: 39 ng/mL (ref 30–89)

## 2013-07-27 ENCOUNTER — Encounter: Payer: Self-pay | Admitting: Family Medicine

## 2013-07-27 ENCOUNTER — Ambulatory Visit (INDEPENDENT_AMBULATORY_CARE_PROVIDER_SITE_OTHER): Payer: Medicare Other | Admitting: Family Medicine

## 2013-07-27 VITALS — BP 126/60 | HR 63 | Temp 98.2°F | Ht 64.0 in | Wt 160.0 lb

## 2013-07-27 DIAGNOSIS — J069 Acute upper respiratory infection, unspecified: Secondary | ICD-10-CM | POA: Insufficient documentation

## 2013-07-27 DIAGNOSIS — M899 Disorder of bone, unspecified: Secondary | ICD-10-CM

## 2013-07-27 DIAGNOSIS — M8580 Other specified disorders of bone density and structure, unspecified site: Secondary | ICD-10-CM

## 2013-07-27 DIAGNOSIS — R946 Abnormal results of thyroid function studies: Secondary | ICD-10-CM

## 2013-07-27 DIAGNOSIS — Z8546 Personal history of malignant neoplasm of prostate: Secondary | ICD-10-CM

## 2013-07-27 DIAGNOSIS — Z0000 Encounter for general adult medical examination without abnormal findings: Secondary | ICD-10-CM | POA: Insufficient documentation

## 2013-07-27 DIAGNOSIS — Z1211 Encounter for screening for malignant neoplasm of colon: Secondary | ICD-10-CM | POA: Insufficient documentation

## 2013-07-27 DIAGNOSIS — R7989 Other specified abnormal findings of blood chemistry: Secondary | ICD-10-CM

## 2013-07-27 DIAGNOSIS — I10 Essential (primary) hypertension: Secondary | ICD-10-CM

## 2013-07-27 DIAGNOSIS — E7800 Pure hypercholesterolemia, unspecified: Secondary | ICD-10-CM

## 2013-07-27 LAB — T3 UPTAKE: T3 Uptake: 40.2 % — ABNORMAL HIGH (ref 22.5–37.0)

## 2013-07-27 LAB — TSH: TSH: 3.92 u[IU]/mL (ref 0.35–5.50)

## 2013-07-27 LAB — T4, FREE: Free T4: 0.62 ng/dL (ref 0.60–1.60)

## 2013-07-27 NOTE — Progress Notes (Signed)
Subjective:    Patient ID: [name], male    DOB: [birth year], 79 y.o.   MRN: [id number]  HPI I have personally reviewed the Medicare Annual Wellness questionnaire and have noted 1. The patient's medical and social history 2. Their use of alcohol, tobacco or illicit drugs 3. Their current medications and supplements 4. The patient's functional ability including ADL's, fall risks, home safety risks and hearing or visual             impairment. 5. Diet and physical activities 6. Evidence for depression or mood disorders  The patients weight, height, BMI have been recorded in the chart and visual acuity is per eye clinic.  I have made referrals, counseling and provided education to the patient based review of the above and I have provided the pt with a written personalized care plan for preventive services.  Doing fair Not really "sharp" - his appetite is less than usual  No weight loss  Has had a stuffy nose lately and cough- ? Getting over a cold   See scanned forms.  Routine anticipatory guidance given to patient.  See health maintenance. Flu vaccine 9/14  Shingles 10/06 PNA 3/04 vaccine  Tetanus 10/13  Colon 5/04 - polyps ( would hesitate to do another colonosc at his age )  Prostate cancer screening- psa is not high- f/u for prostate cancer  Advance directive - has a living will set up  Cognitive function addressed- see scanned forms- and if abnormal then additional documentation follows. - memory is ok - not as good as it used to be -occas misplaces items   PMH and SH reviewed  Meds, vitals, and allergies reviewed.   ROS: See HPI.  Otherwise negative.    dexa 01 normal D level is 39 -he continues to take his vitamin D  Hx of bone loss/ kyphosis  Lab Results  Component Value Date   TSH 8.27* 07/21/2013   will do extra thyroid lab today  This happened last year and repeat was nl Hypothyroid runs in family  No goiter that he has noticed   Hx of prostate ca Lab  Results  Component Value Date   PSA 0.44 07/21/2013   PSA 0.29 06/04/2012   PSA 0.31 06/05/2011   fairly stable  No longer returns to urology after his prostatectomy  No urinary symptoms   Wt is up 3 lb with bmi of 27    Chemistry      Component Value Date/Time   NA 135 07/21/2013 0837   K 4.7 07/21/2013 0837   CL 101 07/21/2013 0837   CO2 29 07/21/2013 0837   BUN 25* 07/21/2013 0837   CREATININE 1.1 07/21/2013 0837      Component Value Date/Time   CALCIUM 9.3 07/21/2013 0837   ALKPHOS 50 07/21/2013 0837   AST 27 07/21/2013 0837   ALT 21 07/21/2013 0837   BILITOT 0.7 07/21/2013 0837     Lab Results  Component Value Date   WBC 8.9 07/21/2013   HGB 14.7 07/21/2013   HCT 42.9 07/21/2013   MCV 98.7 07/21/2013   PLT 323.0 07/21/2013    Lab Results  Component Value Date   CHOL 149 07/21/2013   CHOL 144 06/04/2012   CHOL 148 06/05/2011   Lab Results  Component Value Date   HDL 45.00 07/21/2013   HDL 41.90 06/04/2012   HDL 78.29 06/05/2011   Lab Results  Component Value Date   LDLCALC 85 07/21/2013   LDLCALC 83  06/04/2012   LDLCALC 89 06/05/2011   Lab Results  Component Value Date   TRIG 96.0 07/21/2013   TRIG 97.0 06/04/2012   TRIG 78.0 06/05/2011   Lab Results  Component Value Date   CHOLHDL 3 07/21/2013   CHOLHDL 3 06/04/2012   CHOLHDL 3 06/05/2011   No results found for this basename: LDLDIRECT   stable and well controlled   bp is stable today  No cp or palpitations or headaches or edema  No side effects to medicines  BP Readings from Last 3 Encounters:  07/27/13 126/60  05/10/13 122/62  04/04/13 112/64     No falls   Patient Active Problem List   Diagnosis Date Noted  . Encounter for Medicare annual wellness exam 07/27/2013  . Contact dermatitis 05/10/2013  . Motion sickness 04/04/2013  . Osteopenia 06/14/2012  . Elevated TSH 06/14/2012  . Hyperkalemia 06/11/2011  . MACROCYTIC ANEMIA 07/29/2010  . 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 FECAL OCCULT BLOOD  02/01/2010  . ANEMIA, MILD 01/23/2010  . HYPERTENSION 02/23/2009  . CAROTID ARTERY STENOSIS 02/23/2009  . SPONDYLOSIS, LUMBAR 02/23/2009  . NECK PAIN, CHRONIC 02/23/2009  . BACK PAIN, CHRONIC 02/23/2009  . COMPRESSION FRACTURE 02/23/2009  . HYPERCHOLESTEROLEMIA 02/21/2009  . NEOPLASM, MALIGNANT, PROSTATE, HX OF 02/21/2009   Past Medical History  Diagnosis Date  . Hypertension   . Carotid artery stenosis   . Hyperlipidemia   . History of prostate cancer   . Degenerative disc disease   . Kyphosis   . Fractures     compression  . Non-cardiac chest pain 04/11    hospital ? [facility]  . Cancer     prostate   Past Surgical History  Procedure Laterality Date  . Prostate surgery    . Herniated disc surgery  1990    L3-4  . Inguinal hernia repair  1979    right  . Tonsillectomy    . Prostate cancer surgery    . Cardiovascular stress test  2004    cardiolite negative  . Carotid doppler  11/05    0-39% bilatteral   History  Substance Use Topics  . Smoking status: Never Smoker   . Smokeless tobacco: Not on file  . Alcohol Use: Yes     Comment: 2 drinks per day   Family History  Problem Relation Age of Onset  . Hypertension Father    No Known Allergies Current Outpatient Prescriptions on File Prior to Visit  Medication Sig Dispense Refill  . aspirin EC 81 MG EC tablet Take 81 mg by mouth daily.        Marland Kitchen guaiFENesin (MUCINEX) 600 MG 12 hr tablet Take 1,200 mg by mouth as needed.      . Loratadine 10 MG CAPS Take by mouth as needed.      . mometasone (ELOCON) 0.1 % cream Apply topically daily. To affected area  45 g  0  . Selenium (SELENIMIN-200 PO) Take by mouth daily.        . simvastatin (ZOCOR) 20 MG tablet TAKE 1 TABLET BY MOUTH AT BEDTIME  90 tablet  1  . trandolapril (MAVIK) 2 MG tablet TAKE 1 TABLET BY MOUTH EVERY DAY  90 tablet  1  . vitamin B-12 (CYANOCOBALAMIN) 100 MCG tablet Take 100 mcg by mouth. 3 times a week      . VITAMIN D, CHOLECALCIFEROL, PO Take 1 tablet  by mouth daily.        . vitamin E 100 UNIT capsule Take 100  Units by mouth 3 (three) times a week.       No current facility-administered medications on file prior to visit.     Review of Systems Review of Systems  Constitutional: Negative for fever, appetite change, fatigue and unexpected weight change.  Eyes: Negative for pain and visual disturbance.  ENT pos for congestion and rhinorrhea and mild st  Respiratory: Negative for wheeze and shortness of breath.   Cardiovascular: Negative for cp or palpitations    Gastrointestinal: Negative for nausea, diarrhea and constipation.  Genitourinary: Negative for urgency and frequency.  Skin: Negative for pallor or rash   [facility] pos for aches and pains of arthritis  Neurological: Negative for weakness, light-headedness, numbness and headaches.  Hematological: Negative for adenopathy. Does not bruise/bleed easily.  Psychiatric/Behavioral: Negative for dysphoric mood. The patient is not nervous/anxious.         Objective:   Physical Exam  Constitutional: He is oriented to person, place, and time. He appears well-developed and well-nourished. No distress.  HENT:  Head: Normocephalic and atraumatic.  Right Ear: External ear normal.  Left Ear: External ear normal.  Mouth/Throat: Oropharynx is clear and moist.  Nares are injected and congested  No sinus tenderness Clear post nasal drip   Eyes: Conjunctivae and EOM are normal. Pupils are equal, round, and reactive to light. Right eye exhibits no discharge. Left eye exhibits no discharge. No scleral icterus.  Neck: Normal range of motion. Neck supple. No JVD present. Carotid bruit is not present. No thyromegaly present.  Cardiovascular: Normal rate, regular rhythm, normal heart sounds and intact distal pulses.  Exam reveals no gallop.   Pulmonary/Chest: Effort normal and breath sounds normal. No respiratory distress. He has no wheezes. He has no rales.  Abdominal: Soft. Bowel sounds are normal.  He exhibits no distension, no abdominal bruit and no mass. There is no tenderness.  Musculoskeletal: Normal range of motion. He exhibits no edema and no tenderness.  No change in his kyphosis today  Lymphadenopathy:    He has no cervical adenopathy.  Neurological: He is alert and oriented to person, place, and time. He has normal reflexes. No cranial nerve deficit. He exhibits normal muscle tone. Coordination normal.  Skin: Skin is warm and dry. No rash noted. No erythema. No pallor.  Psychiatric: He has a normal mood and affect.          Assessment & Plan:

## 2013-07-27 NOTE — Patient Instructions (Signed)
Please do stool card for colon cancer screening  We will check some more thyroid tests today  For you cold symptoms - mucinex helps with congestion  Update if not starting to improve in a week or if worsening  -your lungs sound clear today

## 2013-07-27 NOTE — Progress Notes (Signed)
Pre-visit discussion using our clinic review tool. No additional management support is needed unless otherwise documented below in the visit note.  

## 2013-07-28 NOTE — Assessment & Plan Note (Signed)
Reassuring exam This is going around his house Disc symptomatic care - see instructions on AVS  Update if not starting to improve in a week or if worsening

## 2013-07-28 NOTE — Assessment & Plan Note (Signed)
BP: 126/60 mmHg  bp in fair control at this time  No changes needed  Disc lifstyle change with low sodium diet and exercise

## 2013-07-28 NOTE — Assessment & Plan Note (Signed)
In past repeat high tsh was nl  Check thyroid prof today No goiter Pos fam hx

## 2013-07-28 NOTE — Assessment & Plan Note (Signed)
Do not recommend colonosc at adv age IFOB given, pt agreeable

## 2013-07-28 NOTE — Assessment & Plan Note (Signed)
Reviewed health habits including diet and exercise and skin cancer prevention Reviewed appropriate screening tests for age  Also reviewed health mt list, fam hx and immunization status , as well as social and family history   See HPI Labs rev   

## 2013-07-28 NOTE — Assessment & Plan Note (Signed)
Last dexa stable  Kyphosis noted D level in 30s  Can disc scheduling another dexa at any time He wants to avoid medication

## 2013-07-28 NOTE — Assessment & Plan Note (Signed)
S/p prostatectomy No problems or symptoms Psa stable

## 2013-07-28 NOTE — Assessment & Plan Note (Signed)
Disc goals for lipids and reasons to control them Rev labs with pt Rev low sat fat diet in detail Simvastatin and fair diet

## 2013-08-03 ENCOUNTER — Other Ambulatory Visit (INDEPENDENT_AMBULATORY_CARE_PROVIDER_SITE_OTHER): Payer: Medicare Other

## 2013-08-03 DIAGNOSIS — Z1211 Encounter for screening for malignant neoplasm of colon: Secondary | ICD-10-CM

## 2013-08-03 LAB — FECAL OCCULT BLOOD, GUAIAC: Fecal Occult Blood: NEGATIVE

## 2013-08-03 LAB — FECAL OCCULT BLOOD, IMMUNOCHEMICAL: Fecal Occult Bld: NEGATIVE

## 2013-08-05 ENCOUNTER — Encounter: Payer: Self-pay | Admitting: *Deleted

## 2013-08-05 ENCOUNTER — Encounter: Payer: Self-pay | Admitting: Family Medicine

## 2013-08-24 ENCOUNTER — Other Ambulatory Visit: Payer: Self-pay | Admitting: Family Medicine

## 2013-11-23 ENCOUNTER — Other Ambulatory Visit: Payer: Self-pay | Admitting: Family Medicine

## 2014-02-14 ENCOUNTER — Telehealth: Payer: Self-pay

## 2014-02-14 ENCOUNTER — Ambulatory Visit (INDEPENDENT_AMBULATORY_CARE_PROVIDER_SITE_OTHER): Payer: Medicare Other | Admitting: Family Medicine

## 2014-02-14 ENCOUNTER — Encounter: Payer: Self-pay | Admitting: Family Medicine

## 2014-02-14 VITALS — BP 122/70 | HR 68 | Temp 98.2°F | Ht 64.0 in | Wt 157.5 lb

## 2014-02-14 DIAGNOSIS — W57XXXA Bitten or stung by nonvenomous insect and other nonvenomous arthropods, initial encounter: Secondary | ICD-10-CM | POA: Insufficient documentation

## 2014-02-14 DIAGNOSIS — T148 Other injury of unspecified body region: Secondary | ICD-10-CM

## 2014-02-14 MED ORDER — DOXYCYCLINE HYCLATE 100 MG PO TABS
100.0000 mg | ORAL_TABLET | Freq: Two times a day (BID) | ORAL | Status: DC
Start: 2014-02-14 — End: 2014-05-02

## 2014-02-14 NOTE — Progress Notes (Signed)
Pre visit review using our clinic review tool, if applicable. No additional management support is needed unless otherwise documented below in the visit note. 

## 2014-02-14 NOTE — Patient Instructions (Signed)
Keep the tick bites clean with antibacterial soap and water  For itching - cortisone cream over the counter can be helpful  If you develop rash or fever or other symptoms- let me know  Take the doxycycline as directed and finish the course

## 2014-02-14 NOTE — Telephone Encounter (Signed)
Pt left v/m; over the past few years pt gets series of tick bites and Dr Glori Bickers gives pt antibiotics. This year pt has had series of tick bites but pts wife has been able to remove ticks without pt having to come to office. Pt request antibiotics sent to [facility] since had tick bites this year. Pt would prefer not to come in for appt. Pt request cb.

## 2014-02-14 NOTE — Telephone Encounter (Signed)
If this becomes red/target shaped/painful or worse (or fever /rash etc)- please give appt with first avail provider  It may be helpful to re iterate this to his wife as well  He can use some otc hydrocortisone if itchy  Thanks

## 2014-02-14 NOTE — Assessment & Plan Note (Signed)
2 bites on R ankle and L arm look ok - slt induration and erythema  Bite on R inner thigh does have a bullseye appearance with a band of clearing (also bruise)  Per report- deer ticks No other symptoms  Will empirically cover with doxycycline Bite care discussed -see AVS Update if not starting to improve in a week or if worsening   Will watch for fever or rash or other symptoms

## 2014-02-14 NOTE — Progress Notes (Signed)
Subjective:    Patient ID: [name], male    DOB: [birth year], 80 y.o.   MRN: [id number]  HPI Here for tick bites (he seems to be prone to them)   He works in the yard and tries to use insect repellent most of the time   2 bites on ankle - tiny ticks  One on L upper arm - very tiny - had not been there long  One on his pelvic area   All the ticks were pin head size - suspect deer ticks   He has been feeling ok   Patient Active Problem List   Diagnosis Date Noted  . Encounter for Medicare annual wellness exam 07/27/2013  . Colon cancer screening 07/27/2013  . Contact dermatitis 05/10/2013  . Motion sickness 04/04/2013  . Osteopenia 06/14/2012  . Elevated TSH 06/14/2012  . MACROCYTIC ANEMIA 07/29/2010  . 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 FECAL OCCULT BLOOD 02/01/2010  . HYPERTENSION 02/23/2009  . CAROTID ARTERY STENOSIS 02/23/2009  . SPONDYLOSIS, LUMBAR 02/23/2009  . NECK PAIN, CHRONIC 02/23/2009  . BACK PAIN, CHRONIC 02/23/2009  . COMPRESSION FRACTURE 02/23/2009  . HYPERCHOLESTEROLEMIA 02/21/2009  . NEOPLASM, MALIGNANT, PROSTATE, HX OF 02/21/2009   Past Medical History  Diagnosis Date  . Hypertension   . Carotid artery stenosis   . Hyperlipidemia   . History of prostate cancer   . Degenerative disc disease   . Kyphosis   . Fractures     compression  . Non-cardiac chest pain 04/11    hospital ? [facility]  . Cancer     prostate   Past Surgical History  Procedure Laterality Date  . Prostate surgery    . Herniated disc surgery  1990    L3-4  . Inguinal hernia repair  1979    right  . Tonsillectomy    . Prostate cancer surgery    . Cardiovascular stress test  2004    cardiolite negative  . Carotid doppler  11/05    0-39% bilatteral   History  Substance Use Topics  . Smoking status: Never Smoker   . Smokeless tobacco: Not on file  . Alcohol Use: Yes     Comment: 2 drinks per day   Family History  Problem Relation Age of Onset  . Hypertension Father    No  Known Allergies Current Outpatient Prescriptions on File Prior to Visit  Medication Sig Dispense Refill  . aspirin EC 81 MG EC tablet Take 81 mg by mouth daily.        Marland Kitchen dextromethorphan (DELSYM) 30 MG/5ML liquid Take by mouth as needed for cough.      Marland Kitchen guaiFENesin (MUCINEX) 600 MG 12 hr tablet Take 1,200 mg by mouth as needed.      . Loratadine 10 MG CAPS Take by mouth as needed.      . Selenium (SELENIMIN-200 PO) Take by mouth daily.        . simvastatin (ZOCOR) 20 MG tablet TAKE 1 TABLET BY MOUTH AT BEDTIME  90 tablet  1  . trandolapril (MAVIK) 2 MG tablet TAKE 1 TABLET BY MOUTH EVERY DAY  90 tablet  1  . vitamin B-12 (CYANOCOBALAMIN) 100 MCG tablet Take 100 mcg by mouth. 3 times a week      . VITAMIN D, CHOLECALCIFEROL, PO Take 1 tablet by mouth daily.        . vitamin E 100 UNIT capsule Take 100 Units by mouth 3 (three) times a week.  No current facility-administered medications on file prior to visit.       Review of Systems Review of Systems  Constitutional: Negative for fever, appetite change, fatigue and unexpected weight change.  Eyes: Negative for pain and visual disturbance.  Respiratory: Negative for cough and shortness of breath.   Cardiovascular: Negative for cp or palpitations    Gastrointestinal: Negative for nausea, diarrhea and constipation.  Genitourinary: Negative for urgency and frequency.  Skin: Negative for pallor or rash  pos for itchy tick bites [facility] neg for joint pain or swelling  Neurological: Negative for weakness, light-headedness, numbness and headaches.  Hematological: Negative for adenopathy. Does not bruise/bleed easily.  Psychiatric/Behavioral: Negative for dysphoric mood. The patient is not nervous/anxious.         Objective:   Physical Exam  Constitutional: He appears well-developed and well-nourished. No distress.  Elderly and well app  HENT:  Head: Normocephalic and atraumatic.  Mouth/Throat: Oropharynx is clear and moist.  Eyes:  Conjunctivae and EOM are normal. Pupils are equal, round, and reactive to light.  Neck: Normal range of motion. Neck supple.  Cardiovascular: Normal rate and regular rhythm.   Pulmonary/Chest: Effort normal and breath sounds normal.  Abdominal:  No inguinal adenopathy   Musculoskeletal: He exhibits no edema.  Kyphosis noted   No joint changes   Lymphadenopathy:    He has no cervical adenopathy.  Neurological: He is alert. He has normal reflexes.  Skin: Skin is warm and dry. No rash noted. There is erythema.  Tick bites  2 on R ankle less than 1 cm diam with induration and erythema   1 on L arm -appearing the same as the ankle  1 on R inner thigh- this has a bullseye appearance to it- 1-2 cm diam / small scab and scant bruising   Psychiatric: He has a normal mood and affect.          Assessment & Plan:   Problem List Items Addressed This Visit     Musculoskeletal and Integument   Tick bites - Primary     2 bites on R ankle and L arm look ok - slt induration and erythema  Bite on R inner thigh does have a bullseye appearance with a band of clearing (also bruise)  Per report- deer ticks No other symptoms  Will empirically cover with doxycycline Bite care discussed -see AVS Update if not starting to improve in a week or if worsening   Will watch for fever or rash or other symptoms

## 2014-02-14 NOTE — Telephone Encounter (Signed)
Pt notified of Dr. Marliss Coots comments/recommendations.  Pt is still requesting an abx sent in for him. Pt said he doesn't have any fever or joint swelling or pain but the area around his tick bite is swollen. Pt's power is out so he can't see if he has a bullseye/target rash or not around the bite but he can feel that it's swollen, please advise

## 2014-02-14 NOTE — Telephone Encounter (Signed)
To prevent antibiotic resistant bacteria- I only want to treat a tick bite with antibiotics if rash develops/ or a lesion that looks like a bullseye/target, or if fever /joint swelling or pain Otherwise remove ticks promptly and keep areas clean with antibacterial soap and water

## 2014-02-14 NOTE — Telephone Encounter (Signed)
Spoke with wife and pt will be seen today at 4pm due to rash around tick bite and wife thinks pt may have a low grade fever

## 2014-02-22 ENCOUNTER — Other Ambulatory Visit: Payer: Self-pay | Admitting: Family Medicine

## 2014-05-02 ENCOUNTER — Ambulatory Visit (INDEPENDENT_AMBULATORY_CARE_PROVIDER_SITE_OTHER)
Admission: RE | Admit: 2014-05-02 | Discharge: 2014-05-02 | Disposition: A | Discharge status: Home / Self Care | Payer: Medicare Other | Source: Ambulatory Visit | Attending: Family Medicine | Admitting: Family Medicine

## 2014-05-02 ENCOUNTER — Ambulatory Visit (INDEPENDENT_AMBULATORY_CARE_PROVIDER_SITE_OTHER): Payer: Medicare Other | Admitting: Family Medicine

## 2014-05-02 ENCOUNTER — Encounter: Payer: Self-pay | Admitting: Family Medicine

## 2014-05-02 ENCOUNTER — Telehealth: Payer: Self-pay

## 2014-05-02 VITALS — BP 140/70 | HR 69 | Temp 98.1°F | Ht 64.0 in | Wt 153.5 lb

## 2014-05-02 DIAGNOSIS — M545 Low back pain, unspecified: Secondary | ICD-10-CM

## 2014-05-02 DIAGNOSIS — R82998 Other abnormal findings in urine: Secondary | ICD-10-CM

## 2014-05-02 DIAGNOSIS — R350 Frequency of micturition: Secondary | ICD-10-CM | POA: Insufficient documentation

## 2014-05-02 DIAGNOSIS — R8290 Unspecified abnormal findings in urine: Secondary | ICD-10-CM

## 2014-05-02 LAB — POCT URINALYSIS DIPSTICK
Bilirubin, UA: NEGATIVE
Glucose, UA: NEGATIVE
Ketones, UA: NEGATIVE
Nitrite, UA: NEGATIVE
Spec Grav, UA: 1.02
Urobilinogen, UA: 0.2
pH, UA: 6.5

## 2014-05-02 MED ORDER — CYCLOBENZAPRINE HCL 10 MG PO TABS: ORAL_TABLET | ORAL | Status: DC

## 2014-05-02 NOTE — Patient Instructions (Signed)
Use some gentle heat on your back  Take tylenol as directed if needed Try the flexeril as directed -watch out for sedation (this is a muscle relaxer)  If you sleep on your back, put a pillow under bent knees  Xray now  We will contact you with results and make a plan Please bring a urine specimen tomorrow - bring it right after you urinate

## 2014-05-02 NOTE — Progress Notes (Signed)
Subjective:    Patient ID: [name], male    DOB: 01/01/1920, 80 y.o.   MRN: [id number]  HPI Here for back pain  That has gone on for months - much worse for the past worse in the past week  Across the lower back - localized on the R side now - coming around to the abdomen  Worse to move and turn to the R  No fever  No n/v  A little abdominal soreness  Pain scale - 8-9 when he moves certain ways  No hx of kidney stones  Water intake - gets about 6 glasses per day (4-6 oz glass) , drinks quite a bit of tea and fruit juice   No burning to urinate  Is urinating frequently at night - gets up every hour or so  Has had a prostate removal in past -has had frequent urination ever since  (his urologist retired) -of note he had 2 surgeries/ one for adhesions followed by indwelling cath for months in 1995   Urine smells nl No blood in urine   Hurts to bend forward and to the right   No meds for pain   Results for orders placed in visit on 05/02/14  POCT URINALYSIS DIPSTICK      Result Value Ref Range   Color, UA yellow     Clarity, UA clear     Glucose, UA neg.     Bilirubin, UA neg.     Ketones, UA neg.     Spec Grav, UA 1.020     Blood, UA trace     pH, UA 6.5     Protein, UA trace     Urobilinogen, UA 0.2     Nitrite, UA neg.     Leukocytes, UA Trace      Only a tr of leuk and blood   Patient Active Problem List   Diagnosis Date Noted  . Low back pain 05/02/2014  . Urinary frequency 05/02/2014  . Tick bites 02/14/2014  . Encounter for Medicare annual wellness exam 07/27/2013  . Colon cancer screening 07/27/2013  . Contact dermatitis 05/10/2013  . Motion sickness 04/04/2013  . Osteopenia 06/14/2012  . Elevated TSH 06/14/2012  . MACROCYTIC ANEMIA 07/29/2010  . 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 FECAL OCCULT BLOOD 02/01/2010  . HYPERTENSION 02/23/2009  . CAROTID ARTERY STENOSIS 02/23/2009  . SPONDYLOSIS, LUMBAR 02/23/2009  . NECK PAIN, CHRONIC 02/23/2009  . BACK PAIN,  CHRONIC 02/23/2009  . COMPRESSION FRACTURE 02/23/2009  . HYPERCHOLESTEROLEMIA 02/21/2009  . NEOPLASM, MALIGNANT, PROSTATE, HX OF 02/21/2009   Past Medical History  Diagnosis Date  . Hypertension   . Carotid artery stenosis   . Hyperlipidemia   . History of prostate cancer   . Degenerative disc disease   . Kyphosis   . Fractures     compression  . Non-cardiac chest pain 04/11    hospital ? [facility]  . Cancer     prostate   Past Surgical History  Procedure Laterality Date  . Prostate surgery    . Herniated disc surgery  1990    L3-4  . Inguinal hernia repair  1979    right  . Tonsillectomy    . Prostate cancer surgery    . Cardiovascular stress test  2004    cardiolite negative  . Carotid doppler  11/05    0-39% bilatteral   History  Substance Use Topics  . Smoking status: Never Smoker   . Smokeless tobacco: Not on  file  . Alcohol Use: Yes     Comment: 2 drinks per day   Family History  Problem Relation Age of Onset  . Hypertension Father    No Known Allergies Current Outpatient Prescriptions on File Prior to Visit  Medication Sig Dispense Refill  . aspirin EC 81 MG EC tablet Take 81 mg by mouth daily.        Marland Kitchen guaiFENesin (MUCINEX) 600 MG 12 hr tablet Take 1,200 mg by mouth as needed.      . Loratadine 10 MG CAPS Take by mouth as needed.      . Selenium (SELENIMIN-200 PO) Take by mouth daily.        . simvastatin (ZOCOR) 20 MG tablet TAKE 1 TABLET BY MOUTH AT BEDTIME  90 tablet  1  . trandolapril (MAVIK) 2 MG tablet TAKE 1 TABLET BY MOUTH EVERY DAY  90 tablet  0  . vitamin B-12 (CYANOCOBALAMIN) 100 MCG tablet Take 100 mcg by mouth. 3 times a week      . VITAMIN D, CHOLECALCIFEROL, PO Take 1 tablet by mouth daily.        . vitamin E 100 UNIT capsule Take 100 Units by mouth 3 (three) times a week.       No current facility-administered medications on file prior to visit.    Review of Systems Review of Systems  Constitutional: Negative for fever, appetite change,  fatigue and unexpected weight change.  Eyes: Negative for pain and visual disturbance.  Respiratory: Negative for cough and shortness of breath.   Cardiovascular: Negative for cp or palpitations    Gastrointestinal: Negative for nausea, diarrhea and constipation. pos for low R abd pain w/o bulging  Genitourinary: Negative for urgency and pos for frequency (every since prostate surg) , neg for dysuria  or hematuria Skin: Negative for pallor or rash   [facility] pos for low back pain on R rad to R abd  Neurological: Negative for weakness, light-headedness, numbness and headaches.  Hematological: Negative for adenopathy. Does not bruise/bleed easily.  Psychiatric/Behavioral: Negative for dysphoric mood. The patient is not nervous/anxious.         Objective:   Physical Exam  Constitutional: He appears well-developed and well-nourished. No distress.  HENT:  Head: Normocephalic and atraumatic.  Mouth/Throat: Oropharynx is clear and moist.  Eyes: Conjunctivae and EOM are normal. Pupils are equal, round, and reactive to light. No scleral icterus.  Neck: Normal range of motion. Neck supple. No JVD present. Carotid bruit is not present.  Cardiovascular: Normal rate and regular rhythm.   Pulmonary/Chest: Effort normal and breath sounds normal. No respiratory distress. He has no wheezes. He has no rales.  Abdominal: Soft. Bowel sounds are normal. He exhibits no distension and no mass. There is no hepatosplenomegaly. There is tenderness in the right lower quadrant. There is no rebound, no guarding, no CVA tenderness, no tenderness at McBurney's point and negative Murphy's sign.  No cva tenderness  Musculoskeletal: He exhibits tenderness. He exhibits no edema.       Lumbar back: He exhibits decreased range of motion, tenderness, bony tenderness and spasm. He exhibits no swelling and no deformity.  R sided lumbar spasm Also R piriformis tenderness  Nl rom hip  Pos SLR for R calf pain   No neuro  change  Flex 40 deg Ext 10 deg L flex-painful  R flex -nl   Lymphadenopathy:    He has no cervical adenopathy.  Neurological: He is alert. He has normal strength  and normal reflexes. He displays no atrophy. No cranial nerve deficit or sensory deficit. He exhibits normal muscle tone. Gait abnormal. Coordination normal.  Wide based shuffling gait w/o foot drop  Skin: Skin is warm and dry. No rash noted. No erythema. No pallor.  Psychiatric: He has a normal mood and affect.          Assessment & Plan:

## 2014-05-02 NOTE — Telephone Encounter (Signed)
Pt having rt sided back pain, frequency of urine last night; pt said some pain rt lower abdomen. No fever or pain upon urination. Pt already has appt with Dr Glori Bickers today at 2:15 pm. Pt will keep appt and if condition changes or worsens prior to appt pt will cb.

## 2014-05-02 NOTE — Telephone Encounter (Signed)
I will see him then

## 2014-05-02 NOTE — Progress Notes (Signed)
Pre visit review using our clinic review tool, if applicable. No additional management support is needed unless otherwise documented below in the visit note. 

## 2014-05-03 LAB — URINE CULTURE: Colony Count: 5000

## 2014-05-03 NOTE — Assessment & Plan Note (Signed)
This radiates to low abd with pos SLR Positional  Spasm in R lumbar musculature and tender piriformis also  Suspect deg lumbar dz-xray now  Given flexeril to use with caution for spasm and pain  No cva tenderness  ua sent for cx - suspect negative / and also doubt renal stone given exam - KUB also done  Note: prev prostate surg

## 2014-05-03 NOTE — Assessment & Plan Note (Signed)
This is his baseline  ua mostly neg with tr of blood and leuk- sent for cx  abd film  Will update

## 2014-05-24 ENCOUNTER — Other Ambulatory Visit: Payer: Self-pay | Admitting: Family Medicine

## 2014-08-14 ENCOUNTER — Ambulatory Visit (INDEPENDENT_AMBULATORY_CARE_PROVIDER_SITE_OTHER): Payer: Medicare Other | Admitting: Family Medicine

## 2014-08-14 ENCOUNTER — Encounter: Payer: Self-pay | Admitting: Family Medicine

## 2014-08-14 VITALS — BP 134/70 | HR 66 | Temp 98.0°F | Ht 63.5 in | Wt 152.8 lb

## 2014-08-14 DIAGNOSIS — Z8546 Personal history of malignant neoplasm of prostate: Secondary | ICD-10-CM

## 2014-08-14 DIAGNOSIS — M8580 Other specified disorders of bone density and structure, unspecified site: Secondary | ICD-10-CM

## 2014-08-14 DIAGNOSIS — E538 Deficiency of other specified B group vitamins: Secondary | ICD-10-CM

## 2014-08-14 DIAGNOSIS — I10 Essential (primary) hypertension: Secondary | ICD-10-CM

## 2014-08-14 DIAGNOSIS — R7989 Other specified abnormal findings of blood chemistry: Secondary | ICD-10-CM

## 2014-08-14 DIAGNOSIS — R946 Abnormal results of thyroid function studies: Secondary | ICD-10-CM

## 2014-08-14 DIAGNOSIS — I6529 Occlusion and stenosis of unspecified carotid artery: Secondary | ICD-10-CM

## 2014-08-14 DIAGNOSIS — Z0000 Encounter for general adult medical examination without abnormal findings: Secondary | ICD-10-CM

## 2014-08-14 DIAGNOSIS — Z1211 Encounter for screening for malignant neoplasm of colon: Secondary | ICD-10-CM

## 2014-08-14 DIAGNOSIS — E7800 Pure hypercholesterolemia, unspecified: Secondary | ICD-10-CM

## 2014-08-14 DIAGNOSIS — Z23 Encounter for immunization: Secondary | ICD-10-CM

## 2014-08-14 NOTE — Assessment & Plan Note (Signed)
bp in fair control at this time  BP Readings from Last 1 Encounters:  08/14/14 134/70   No changes needed Disc lifstyle change with low sodium diet and exercise  Labs today

## 2014-08-14 NOTE — Assessment & Plan Note (Signed)
Reviewed health habits including diet and exercise and skin cancer prevention Reviewed appropriate screening tests for age  Also reviewed health mt list, fam hx and immunization status , as well as social and family history   See HPI Labs today  prevnar vaccine today

## 2014-08-14 NOTE — Progress Notes (Signed)
Subjective:    Patient ID: [name], male    DOB: Jun 23, 1920, 80 y.o.   MRN: [id number]  HPI Here for annual medicare wellness visit as well as chronic and acute medical problems  Wt is down 1 lb with bmi of 26  I have personally reviewed the Medicare Annual Wellness questionnaire and have noted 1. The patient's medical and social history 2. Their use of alcohol, tobacco or illicit drugs 3. Their current medications and supplements 4. The patient's functional ability including ADL's, fall risks, home safety risks and hearing or visual             impairment. 5. Diet and physical activities 6. Evidence for depression or mood disorders  The patients weight, height, BMI have been recorded in the chart and visual acuity is per eye clinic.  I have made referrals, counseling and provided education to the patient based review of the above and I have provided the pt with a written personalized care plan for preventive services.  Doing fairly well  Trying to take care of himself  Raked leaves this am - can still do that    See scanned forms.  Routine anticipatory guidance given to patient.  See health maintenance. Colon cancer screening 01/12/23 , passes on further colon cancer screening  Flu vaccine 9/15 Tetanus vaccine 10/13  Pneumovax 3/04 - wants to do a prevnar  Zoster vaccine 10/06 Prostate cancer screening - due for a psa (hx of prostatectomy with stable continence issues)  Advance directive has a living will and power of attorney done  Cognitive function addressed- see scanned forms- and if abnormal then additional documentation follows. Thinks his memory is pretty good overall   PMH and SH reviewed  Meds, vitals, and allergies reviewed.   ROS: See HPI.  Otherwise negative.    Hx of carotid stenosis  Last doppler was 10/13 He wants to keep checking those  He is not due for visit with cardiology right now  Has not had any stroke or TIA symptoms    bp is stable today    No cp or palpitations or headaches or edema  No side effects to medicines  BP Readings from Last 3 Encounters:  08/14/14 134/70  05/02/14 140/70  02/14/14 122/70     Osteopenia  dexa 2012 Has had compression fx and has kyphosis  Last visit declined further f/u of this and would not want tx  Due for D level No falls or fx in the past year   Hyperlipidemia He is on simvastatin "I have heard that statin can cause problems with legs) So he cut back on it to 1-2 times per week  At times both legs hurt and he gets cramps (he walks it off)  Lab Results  Component Value Date   CHOL 149 07/21/2013   CHOL 144 06/04/2012   CHOL 148 06/05/2011   Lab Results  Component Value Date   HDL 45.00 07/21/2013   HDL 41.90 06/04/2012   HDL 43.20 06/05/2011   Lab Results  Component Value Date   LDLCALC 85 07/21/2013   LDLCALC 83 06/04/2012   LDLCALC 89 06/05/2011   Lab Results  Component Value Date   TRIG 96.0 07/21/2013   TRIG 97.0 06/04/2012   TRIG 78.0 06/05/2011   Lab Results  Component Value Date   CHOLHDL 3 07/21/2013   CHOLHDL 3 06/04/2012   CHOLHDL 3 06/05/2011   No results found for: LDLDIRECT   Expects it to be  up - off med regularly  Wants B12 checked    Hx of prostate ca with prostatectomy in the past  Due for psa-will check today  No new symptoms  Patient Active Problem List   Diagnosis Date Noted  . B12 deficiency 08/14/2014  . Low back pain 05/02/2014  . Urinary frequency 05/02/2014  . Tick bites 02/14/2014  . Encounter for Medicare annual wellness exam 07/27/2013  . Colon cancer screening 07/27/2013  . Contact dermatitis 05/10/2013  . Motion sickness 04/04/2013  . Osteopenia 06/14/2012  . Elevated TSH 06/14/2012  . MACROCYTIC ANEMIA 07/29/2010  . 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 FECAL OCCULT BLOOD 02/01/2010  . Essential hypertension 02/23/2009  . Carotid artery stenosis 02/23/2009  . SPONDYLOSIS, LUMBAR 02/23/2009  . NECK PAIN, CHRONIC 02/23/2009  .  BACK PAIN, CHRONIC 02/23/2009  . COMPRESSION FRACTURE 02/23/2009  . HYPERCHOLESTEROLEMIA 02/21/2009  . NEOPLASM, MALIGNANT, PROSTATE, HX OF 02/21/2009   Past Medical History  Diagnosis Date  . Hypertension   . Carotid artery stenosis   . Hyperlipidemia   . History of prostate cancer   . Degenerative disc disease   . Kyphosis   . Fractures     compression  . Non-cardiac chest pain 04/11    hospital ? [facility]  . Cancer     prostate   Past Surgical History  Procedure Laterality Date  . Prostate surgery    . Herniated disc surgery  1990    L3-4  . Inguinal hernia repair  1979    right  . Tonsillectomy    . Prostate cancer surgery    . Cardiovascular stress test  2004    cardiolite negative  . Carotid doppler  11/05    0-39% bilatteral   History  Substance Use Topics  . Smoking status: Never Smoker   . Smokeless tobacco: Not on file  . Alcohol Use: 0.0 oz/week    0 Not specified per week     Comment: 2 drinks per day   Family History  Problem Relation Age of Onset  . Hypertension Father    No Known Allergies Current Outpatient Prescriptions on File Prior to Visit  Medication Sig Dispense Refill  . aspirin EC 81 MG EC tablet Take 81 mg by mouth daily.      . cyclobenzaprine (FLEXERIL) 10 MG tablet Take 1/2 tablet by mouth up to every 8 hours as needed for back pain 30 tablet 0  . guaiFENesin (MUCINEX) 600 MG 12 hr tablet Take 1,200 mg by mouth as needed.    . Loratadine 10 MG CAPS Take by mouth as needed.    . Selenium (SELENIMIN-200 PO) Take by mouth daily.      . simvastatin (ZOCOR) 20 MG tablet TAKE 1 TABLET BY MOUTH AT BEDTIME 90 tablet 0  . trandolapril (MAVIK) 2 MG tablet TAKE 1 TABLET BY MOUTH EVERY DAY 90 tablet 0  . vitamin B-12 (CYANOCOBALAMIN) 100 MCG tablet Take 100 mcg by mouth. 3 times a week    . VITAMIN D, CHOLECALCIFEROL, PO Take 1 tablet by mouth daily.       No current facility-administered medications on file prior to visit.     Review of  Systems Review of Systems  Constitutional: Negative for fever, appetite change, and unexpected weight change. pos for fatigue  Eyes: Negative for pain and visual disturbance.  Respiratory: Negative for cough and shortness of breath.   Cardiovascular: Negative for cp or palpitations    Gastrointestinal: Negative for nausea, diarrhea and constipation.  Genitourinary: Negative for urgency and frequency. pos for incontinence that is baseline  Skin: Negative for pallor or rash   Neurological: Negative for weakness, light-headedness, numbness and headaches.  Hematological: Negative for adenopathy. Does not bruise/bleed easily.  Psychiatric/Behavioral: Negative for dysphoric mood. The patient is not nervous/anxious.  pos for stressors        Objective:   Physical Exam  Constitutional: He appears well-developed and well-nourished. No distress.  HENT:  Head: Normocephalic and atraumatic.  Right Ear: External ear normal.  Left Ear: External ear normal.  Nose: Nose normal.  Mouth/Throat: Oropharynx is clear and moist.  Eyes: Conjunctivae and EOM are normal. Pupils are equal, round, and reactive to light. Right eye exhibits no discharge. Left eye exhibits no discharge. No scleral icterus.  Neck: Normal range of motion. Neck supple. No JVD present. Carotid bruit is present. No thyromegaly present.  Cardiovascular: Normal rate, regular rhythm, normal heart sounds and intact distal pulses.  Exam reveals no gallop.   Pulmonary/Chest: Effort normal and breath sounds normal. No respiratory distress. He has no wheezes. He exhibits no tenderness.  Abdominal: Soft. Bowel sounds are normal. He exhibits no distension, no abdominal bruit and no mass. There is no tenderness.  Musculoskeletal: He exhibits no edema or tenderness.  Mod to severe kyphosis with difficulty lifting head   Lymphadenopathy:    He has no cervical adenopathy.  Neurological: He is alert. He has normal reflexes. No cranial nerve deficit.  He exhibits normal muscle tone. Coordination normal.  Skin: Skin is warm and dry. No rash noted. No erythema. No pallor.  SKs and lentigos diffusely  Psychiatric: He has a normal mood and affect.          Assessment & Plan:   Problem List Items Addressed This Visit      Cardiovascular and Mediastinum   Carotid artery stenosis    Ref for 2 year re check carotid doppler Disc imp of Htn and cholesterol control     Relevant Orders      Doppler carotid   Essential hypertension    bp in fair control at this time  BP Readings from Last 1 Encounters:  08/14/14 134/70   No changes needed Disc lifstyle change with low sodium diet and exercise  Labs today    Relevant Orders      CBC with Differential      Comprehensive metabolic panel      TSH      Lipid panel     Digestive   B12 deficiency    Pt taking oral B12 Lab today for level    Relevant Orders      Vitamin B12     Musculoskeletal and Integument   Osteopenia    Pt declines further testing or tx  Aware this causes his kyphosis Disc ca and D needs and exercise Disc safety    Relevant Orders      Vit D  25 hydroxy (rtn osteoporosis monitoring)     Other   Colon cancer screening    Pt declines IFOB or colonoscopy due to age at this time     Elevated TSH    In the past - repeat in nl range No goiter Re check today    Relevant Orders      TSH   Encounter for Medicare annual wellness exam - Primary    Reviewed health habits including diet and exercise and skin cancer prevention Reviewed appropriate screening tests for age  Also reviewed health  mt list, fam hx and immunization status , as well as social and family history   See HPI Labs today  prevnar vaccine today     HYPERCHOLESTEROLEMIA    Lipid panel today Will be up due to less simvastatin lately  His symptoms sound atypical for chol med side eff - for this reason I enc him to try daily simvastatin again - if symptoms return - stop it and let me  know     Relevant Orders      Lipid panel   NEOPLASM, MALIGNANT, PROSTATE, HX OF    With prostatectomy in the past No urinary changes  psa today    Relevant Orders      PSA    Other Visit Diagnoses    Need for vaccination with 13-polyvalent pneumococcal conjugate vaccine        Relevant Orders       Pneumococcal conjugate vaccine 13-valent (Completed)

## 2014-08-14 NOTE — Assessment & Plan Note (Signed)
With prostatectomy in the past No urinary changes  psa today

## 2014-08-14 NOTE — Patient Instructions (Signed)
Try the simvastatin again - if leg pain returns or worsens stop it  Labs today (I expect your cholesterol will be higher with less cholesterol medicine lately) Avoid red meat/ fried foods/ egg yolks/ fatty breakfast meats/ butter, cheese and high fat dairy/ and shellfish   Stop at check out to schedule carotid ultrasound test  Also ask them to take you off the mychart system prevnar vaccine today

## 2014-08-14 NOTE — Assessment & Plan Note (Signed)
Pt declines further testing or tx  Aware this causes his kyphosis Disc ca and D needs and exercise Disc safety

## 2014-08-14 NOTE — Assessment & Plan Note (Signed)
Pt taking oral B12 Lab today for level

## 2014-08-14 NOTE — Progress Notes (Signed)
Pre visit review using our clinic review tool, if applicable. No additional management support is needed unless otherwise documented below in the visit note. 

## 2014-08-14 NOTE — Assessment & Plan Note (Signed)
Lipid panel today Will be up due to less simvastatin lately  His symptoms sound atypical for chol med side eff - for this reason I enc him to try daily simvastatin again - if symptoms return - stop it and let me know

## 2014-08-14 NOTE — Assessment & Plan Note (Signed)
In the past - repeat in nl range No goiter Re check today

## 2014-08-14 NOTE — Assessment & Plan Note (Signed)
Ref for 2 year re check carotid doppler Disc imp of Htn and cholesterol control

## 2014-08-14 NOTE — Assessment & Plan Note (Signed)
Pt declines IFOB or colonoscopy due to age at this time

## 2014-08-15 ENCOUNTER — Telehealth: Payer: Self-pay | Admitting: Family Medicine

## 2014-08-15 LAB — LIPID PANEL
Cholesterol: 163 mg/dL (ref 0–200)
HDL: 46.1 mg/dL (ref >39.00)
LDL Cholesterol: 98 mg/dL (ref 0–99)
NonHDL: 116.9
Total CHOL/HDL Ratio: 4
Triglycerides: 94 mg/dL (ref 0.0–149.0)
VLDL: 18.8 mg/dL (ref 0.0–40.0)

## 2014-08-15 LAB — COMPREHENSIVE METABOLIC PANEL
ALT: 14 U/L (ref 0–53)
AST: 21 U/L (ref 0–37)
Albumin: 3.8 g/dL (ref 3.5–5.2)
Alkaline Phosphatase: 47 U/L (ref 39–117)
BUN: 30 mg/dL — ABNORMAL HIGH (ref 6–23)
CO2: 27 mEq/L (ref 19–32)
Calcium: 9.4 mg/dL (ref 8.4–10.5)
Chloride: 103 mEq/L (ref 96–112)
Creatinine, Ser: 1.2 mg/dL (ref 0.4–1.5)
GFR: 60.14 mL/min (ref >60.00)
Glucose, Bld: 80 mg/dL (ref 70–99)
Potassium: 4.6 mEq/L (ref 3.5–5.1)
Sodium: 135 mEq/L (ref 135–145)
Total Bilirubin: 0.8 mg/dL (ref 0.2–1.2)
Total Protein: 6.1 g/dL (ref 6.0–8.3)

## 2014-08-15 LAB — CBC WITH DIFFERENTIAL/PLATELET
Basophils Absolute: 0 10*3/uL (ref 0.0–0.1)
Basophils Relative: 0.3 % (ref 0.0–3.0)
Eosinophils Absolute: 0.1 10*3/uL (ref 0.0–0.7)
Eosinophils Relative: 1.2 % (ref 0.0–5.0)
HCT: 39.6 % (ref 39.0–52.0)
Hemoglobin: 13 g/dL (ref 13.0–17.0)
Lymphocytes Relative: 9.4 % — ABNORMAL LOW (ref 12.0–46.0)
Lymphs Abs: 1 10*3/uL (ref 0.7–4.0)
MCHC: 32.7 g/dL (ref 30.0–36.0)
MCV: 100.7 fl — ABNORMAL HIGH (ref 78.0–100.0)
Monocytes Absolute: 1.1 10*3/uL — ABNORMAL HIGH (ref 0.1–1.0)
Monocytes Relative: 10.6 % (ref 3.0–12.0)
Neutro Abs: 8.5 10*3/uL — ABNORMAL HIGH (ref 1.4–7.7)
Neutrophils Relative %: 78.5 % — ABNORMAL HIGH (ref 43.0–77.0)
Platelets: 309 10*3/uL (ref 150.0–400.0)
RBC: 3.93 Mil/uL — ABNORMAL LOW (ref 4.22–5.81)
RDW: 13.3 % (ref 11.5–15.5)
WBC: 10.8 10*3/uL — ABNORMAL HIGH (ref 4.0–10.5)

## 2014-08-15 LAB — VITAMIN B12: Vitamin B-12: 768 pg/mL (ref 211–911)

## 2014-08-15 LAB — VITAMIN D 25 HYDROXY (VIT D DEFICIENCY, FRACTURES): VITD: 26.55 ng/mL — ABNORMAL LOW (ref 30.00–100.00)

## 2014-08-15 LAB — PSA: PSA: 0.43 ng/mL (ref 0.10–4.00)

## 2014-08-15 LAB — TSH: TSH: 5.79 u[IU]/mL — ABNORMAL HIGH (ref 0.35–4.50)

## 2014-08-15 NOTE — Telephone Encounter (Signed)
emmi emailed °

## 2014-08-16 ENCOUNTER — Ambulatory Visit (HOSPITAL_COMMUNITY): Payer: Medicare Other | Attending: Cardiovascular Disease | Admitting: *Deleted

## 2014-08-16 ENCOUNTER — Other Ambulatory Visit (INDEPENDENT_AMBULATORY_CARE_PROVIDER_SITE_OTHER): Payer: Medicare Other

## 2014-08-16 DIAGNOSIS — I6523 Occlusion and stenosis of bilateral carotid arteries: Secondary | ICD-10-CM | POA: Insufficient documentation

## 2014-08-16 DIAGNOSIS — R946 Abnormal results of thyroid function studies: Secondary | ICD-10-CM

## 2014-08-16 DIAGNOSIS — I6529 Occlusion and stenosis of unspecified carotid artery: Secondary | ICD-10-CM

## 2014-08-16 LAB — T4, FREE: Free T4: 0.68 ng/dL (ref 0.60–1.60)

## 2014-08-16 NOTE — Progress Notes (Signed)
Carotid duplex complete 

## 2014-08-23 ENCOUNTER — Other Ambulatory Visit (INDEPENDENT_AMBULATORY_CARE_PROVIDER_SITE_OTHER): Payer: Medicare Other

## 2014-08-23 ENCOUNTER — Telehealth: Payer: Self-pay | Admitting: *Deleted

## 2014-08-23 DIAGNOSIS — R8290 Unspecified abnormal findings in urine: Secondary | ICD-10-CM

## 2014-08-23 DIAGNOSIS — D72829 Elevated white blood cell count, unspecified: Secondary | ICD-10-CM

## 2014-08-23 LAB — POCT URINALYSIS DIPSTICK
Bilirubin, UA: NEGATIVE
Glucose, UA: NEGATIVE
Ketones, UA: NEGATIVE
Nitrite, UA: NEGATIVE
Spec Grav, UA: 1.015
Urobilinogen, UA: 0.2
pH, UA: 7.5

## 2014-08-23 NOTE — Telephone Encounter (Signed)
Pt brought in a urine sample. He said he was told to bring on in to check for a uti. I put specimen by the microscope.

## 2014-08-23 NOTE — Telephone Encounter (Signed)
done

## 2014-08-24 ENCOUNTER — Other Ambulatory Visit: Payer: Self-pay | Admitting: Family Medicine

## 2014-08-25 LAB — URINE CULTURE: Colony Count: 50000

## 2014-09-21 ENCOUNTER — Other Ambulatory Visit: Payer: Self-pay | Admitting: Family Medicine

## 2014-09-21 DIAGNOSIS — D72829 Elevated white blood cell count, unspecified: Secondary | ICD-10-CM

## 2014-09-26 ENCOUNTER — Other Ambulatory Visit (INDEPENDENT_AMBULATORY_CARE_PROVIDER_SITE_OTHER): Payer: Medicare Other

## 2014-09-26 DIAGNOSIS — D72829 Elevated white blood cell count, unspecified: Secondary | ICD-10-CM

## 2014-09-26 LAB — CBC WITH DIFFERENTIAL/PLATELET
Basophils Absolute: 0 10*3/uL (ref 0.0–0.1)
Basophils Relative: 0.1 % (ref 0.0–3.0)
Eosinophils Absolute: 0.2 10*3/uL (ref 0.0–0.7)
Eosinophils Relative: 2.2 % (ref 0.0–5.0)
HCT: 41.4 % (ref 39.0–52.0)
Hemoglobin: 13.7 g/dL (ref 13.0–17.0)
Lymphocytes Relative: 17.9 % (ref 12.0–46.0)
Lymphs Abs: 1.4 10*3/uL (ref 0.7–4.0)
MCHC: 33.1 g/dL (ref 30.0–36.0)
MCV: 100.6 fl — ABNORMAL HIGH (ref 78.0–100.0)
Monocytes Absolute: 1.2 10*3/uL — ABNORMAL HIGH (ref 0.1–1.0)
Monocytes Relative: 14.8 % — ABNORMAL HIGH (ref 3.0–12.0)
Neutro Abs: 5.1 10*3/uL (ref 1.4–7.7)
Neutrophils Relative %: 65 % (ref 43.0–77.0)
Platelets: 322 10*3/uL (ref 150.0–400.0)
RBC: 4.12 Mil/uL — ABNORMAL LOW (ref 4.22–5.81)
RDW: 13.1 % (ref 11.5–15.5)
WBC: 7.9 10*3/uL (ref 4.0–10.5)

## 2014-09-27 ENCOUNTER — Encounter: Payer: Self-pay | Admitting: *Deleted

## 2014-10-05 ENCOUNTER — Other Ambulatory Visit: Payer: Self-pay

## 2014-10-05 ENCOUNTER — Other Ambulatory Visit: Payer: Self-pay | Admitting: Family Medicine

## 2014-10-05 NOTE — Telephone Encounter (Signed)
Pt left v/m request refill trandolapril to [facility] [address].pt request cb when refilled.

## 2014-10-05 NOTE — Telephone Encounter (Signed)
Please refill for a year  

## 2014-10-06 MED ORDER — TRANDOLAPRIL 2 MG PO TABS: 2.0000 mg | ORAL_TABLET | Freq: Every day | ORAL | Status: DC

## 2014-12-31 NOTE — Op Note (Signed)
PATIENT NAME:  [name], [name] MR#:  [id number] DATE OF BIRTH:  16-Aug-1920  DATE OF PROCEDURE:  09/22/2011  PREOPERATIVE DIAGNOSIS:  Cataract, left eye.    POSTOPERATIVE DIAGNOSIS:  Cataract, left eye.  PROCEDURE PERFORMED:  Extracapsular cataract extraction using phacoemulsification with placement of an Alcon [id number], 16.5-diopter posterior chamber lens, serial # [id number].  SURGEON:  Loura Back. Kamber Vignola, MD  ASSISTANT:  None.  ANESTHESIA:  4% lidocaine and 0.75% Marcaine in a 50/50 mixture with 10 units/mL of Vitrase added, given as a peribulbar.  ANESTHESIOLOGIST:  Dr. Andree Elk.   COMPLICATIONS:  None.  ESTIMATED BLOOD LOSS:  Less than 1 mL.  DESCRIPTION OF PROCEDURE:  The patient was brought to the operating room and given a peribulbar block.  The patient was then prepped and draped in the usual fashion.  The vertical rectus muscles were imbricated using 5-0 silk sutures.  These sutures were then clamped to the sterile drapes as bridle sutures.  A limbal peritomy was performed extending two clock hours and hemostasis was obtained with cautery.  A partial thickness scleral groove was made at the surgical limbus and dissected anteriorly in a lamellar dissection using an Alcon crescent knife.  The anterior chamber was entered supero-temporally with a Superblade and through the lamellar dissection with a 2.6 mm keratome.  DisCoVisc was used to replace the aqueous and a continuous tear capsulorrhexis was carried out.  Hydrodissection and hydrodelineation were carried out with balanced salt and a 27 gauge canula.  The nucleus was rotated to confirm the effectiveness of the hydrodissection.  Phacoemulsification was carried out using a divide-and-conquer technique.  Total ultrasound time was 2 minutes and 6 seconds with an average power of 24.1 percent.  Irrigation/aspiration was used to remove the residual cortex.  DisCoVisc was used to inflate the capsule and the internal incision was enlarged  to 3 mm with the crescent knife.  The intraocular lens was folded and inserted into the capsular bag using the AcrySert Delivery System.  Irrigation/aspiration was used to remove the residual DisCoVisc.  Miostat was injected into the anterior chamber through the paracentesis track to inflate the anterior chamber and induce miosis.  The wound was checked for leaks and none were found. The conjunctiva was closed with cautery and the bridle sutures were removed.  Two drops of 0.3% Vigamox were placed on the eye.   An eye shield was placed on the eye.  The patient was discharged to the recovery room in good condition.  ____________________________ Loura Back Loida Calamia, MD sad:cms D: 09/22/2011 13:54:52 ET T: 09/23/2011 09:30:00 ET JOB#: [id number]  cc: Remo Lipps A. Betzaira Mentel, MD, <Dictator> Martie Lee MD ELECTRONICALLY SIGNED 09/29/2011 13:07

## 2015-01-05 ENCOUNTER — Ambulatory Visit (INDEPENDENT_AMBULATORY_CARE_PROVIDER_SITE_OTHER)
Admission: RE | Admit: 2015-01-05 | Discharge: 2015-01-05 | Disposition: A | Discharge status: Home / Self Care | Payer: Medicare Other | Source: Ambulatory Visit | Attending: Primary Care | Admitting: Primary Care

## 2015-01-05 ENCOUNTER — Ambulatory Visit (INDEPENDENT_AMBULATORY_CARE_PROVIDER_SITE_OTHER): Payer: Medicare Other | Admitting: Primary Care

## 2015-01-05 ENCOUNTER — Encounter: Payer: Self-pay | Admitting: Primary Care

## 2015-01-05 VITALS — BP 118/64 | HR 68 | Temp 97.7°F | Ht 63.5 in | Wt 155.0 lb

## 2015-01-05 DIAGNOSIS — M7989 Other specified soft tissue disorders: Secondary | ICD-10-CM | POA: Diagnosis not present

## 2015-01-05 NOTE — Patient Instructions (Addendum)
Complete xray(s) prior to leaving today. I will contact you regarding your results. You may take ibuprofen or tylenol for pain. Keep wound clean and dry. Your last tetanus shot was in 2013 so you are covered and do not need an additional one. Follow up Wednesday next week for re-evaluation.

## 2015-01-05 NOTE — Progress Notes (Signed)
Subjective:    Patient ID: DAL BLEW, male    DOB: 1920/09/04, 81 y.o.   MRN: [id number]  HPI  [name] is a 81 year old male who presents today with a chief complaint of pain and swelling to fingers on the right hand after falling forward while moving a cart down the driveway yesterday during the rain. He reports he didn't fall hard and did not lose consciousness or hit his head. He reports bleeding and swelling to fingers since the injury. He's able to move his fingers, denies deformity and loss of sensation. Last tetanus was 2013. He's taken Norco (already has at home) for pain with relief.  Review of Systems  Constitutional: Negative for fever and chills.  Musculoskeletal: Positive for myalgias and joint swelling.  Skin: Positive for color change and wound.  Neurological: Negative for dizziness and headaches.       Past Medical History  Diagnosis Date  . Hypertension   . Carotid artery stenosis   . Hyperlipidemia   . History of prostate cancer   . Degenerative disc disease   . Kyphosis   . Fractures     compression  . Non-cardiac chest pain 04/11    hospital ? [facility]  . Cancer     prostate    History   Social History  . Marital Status: Married    Spouse Name: N/A  . Number of Children: N/A  . Years of Education: N/A   Occupational History  . 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 Smoking status: Never Smoker   . Smokeless tobacco: Not on file  . Alcohol Use: 0.0 oz/week    0 Standard drinks or equivalent per week     Comment: 2 drinks per day  . Drug Use: No  . Sexual Activity: Not on file   Other Topics Concern  . 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 Prostate surgery    . Herniated disc surgery  1990    L3-4  . Inguinal hernia repair  1979    right  . Tonsillectomy    . Prostate cancer surgery     . Cardiovascular stress test  2004    cardiolite negative  . Carotid doppler  11/05    0-39% bilatteral    Family History  Problem Relation Age of Onset  . Hypertension Father     No Known Allergies  Current Outpatient Prescriptions on File Prior to Visit  Medication Sig Dispense Refill  . aspirin EC 81 MG EC tablet Take 81 mg by mouth daily.      . cyclobenzaprine (FLEXERIL) 10 MG tablet Take 1/2 tablet by mouth up to every 8 hours as needed for back pain 30 tablet 0  . guaiFENesin (MUCINEX) 600 MG 12 hr tablet Take 1,200 mg by mouth as needed.    . Loratadine 10 MG CAPS Take by mouth as needed.    . Selenium (SELENIMIN-200 PO) Take by mouth daily.      . simvastatin (ZOCOR) 20 MG tablet TAKE 1 TABLET BY MOUTH AT BEDTIME 90 tablet 1  . trandolapril (MAVIK) 2 MG tablet Take 1 tablet (2 mg total) by mouth daily. 90 tablet 1  . vitamin B-12 (CYANOCOBALAMIN) 100 MCG tablet Take 100 mcg by mouth. 3  times a week    . VITAMIN D, CHOLECALCIFEROL, PO Take 1 tablet by mouth daily.       No current facility-administered medications on file prior to visit.    BP 118/64 mmHg  Pulse 68  Temp(Src) 97.7 F (36.5 C) (Oral)  Ht 5' 3.5" (1.613 m)  Wt 155 lb (70.308 kg)  BMI 27.02 kg/m2  SpO2 98%    Objective:   Physical Exam  Constitutional: He is oriented to person, place, and time. He appears well-developed.  Cardiovascular: Normal rate and regular rhythm.   Pulmonary/Chest: Effort normal and breath sounds normal.  Musculoskeletal:  Moderate swelling and redness to 2nd-4th digits of right hand. No loss of sensation, decreased ROM to 3rd digit. Good ROM to 2nd and 4th digits.  Neurological: He is alert and oriented to person, place, and time.  Skin: Skin is warm and dry.  Erythema and swelling to right 2nd,3rd,4th digits. Deep abrasion present to right 3rd digit PIP joint. Small superficial abrasions to right 2nd and 4th digits.          Assessment & Plan:  Hand  injury:  Erythema and swelling to right 2nd, 3rd, 4th digits s/p fall. Xray negative for fracture. Some ROM intact, no loss of sensation. Abrasions present to PIP joins from impact. Wounds irrigated, cleaned, and re-dressed by CMA. Follow up next week for re-evaluation.

## 2015-01-05 NOTE — Progress Notes (Signed)
Pre visit review using our clinic review tool, if applicable. No additional management support is needed unless otherwise documented below in the visit note. 

## 2015-01-09 ENCOUNTER — Telehealth: Payer: Self-pay | Admitting: Family Medicine

## 2015-01-09 ENCOUNTER — Ambulatory Visit: Payer: Medicare Other | Admitting: Family Medicine

## 2015-01-09 DIAGNOSIS — S6991XD Unspecified injury of right wrist, hand and finger(s), subsequent encounter: Secondary | ICD-10-CM

## 2015-01-09 MED ORDER — CEPHALEXIN 500 MG PO CAPS: 500.0000 mg | ORAL_CAPSULE | Freq: Three times a day (TID) | ORAL | Status: DC

## 2015-01-09 NOTE — Telephone Encounter (Signed)
Hand was a bit more red and swollen today  Covered with keflex/ bandage/disc soap and water cleanse No fever F/u wed or fri for re check

## 2015-01-09 NOTE — Progress Notes (Signed)
Pre visit review using our clinic review tool, if applicable. No additional management support is needed unless otherwise documented below in the visit note. 

## 2015-01-10 ENCOUNTER — Ambulatory Visit: Payer: Medicare Other | Admitting: Family Medicine

## 2015-01-10 ENCOUNTER — Ambulatory Visit: Payer: Medicare Other | Admitting: Primary Care

## 2015-01-12 ENCOUNTER — Ambulatory Visit (INDEPENDENT_AMBULATORY_CARE_PROVIDER_SITE_OTHER): Payer: Medicare Other | Admitting: Family Medicine

## 2015-01-12 ENCOUNTER — Encounter: Payer: Self-pay | Admitting: Family Medicine

## 2015-01-12 VITALS — BP 134/68 | HR 62 | Temp 97.5°F | Ht 63.5 in | Wt 154.2 lb

## 2015-01-12 DIAGNOSIS — S6991XA Unspecified injury of right wrist, hand and finger(s), initial encounter: Secondary | ICD-10-CM | POA: Insufficient documentation

## 2015-01-12 DIAGNOSIS — S6991XD Unspecified injury of right wrist, hand and finger(s), subsequent encounter: Secondary | ICD-10-CM

## 2015-01-12 NOTE — Progress Notes (Signed)
Pre visit review using our clinic review tool, if applicable. No additional management support is needed unless otherwise documented below in the visit note. 

## 2015-01-12 NOTE — Patient Instructions (Signed)
Finish your antibiotics  Continue dressing with gauze or band aid and over the counter antibiotic ointment  Wash with soap and water but don't submerge  If increased redness/ swelling or pain let me know  Also if it drains pus please update me  If not improving in the next week come back for another check  Be careful to prevent falls as well

## 2015-01-12 NOTE — Progress Notes (Signed)
Subjective:    Patient ID: [name], male    DOB: 07-14-1920, 81 y.o.   MRN: [id number]  HPI Here for f/u of R hand injury  Happened with a fall on 4/29   Started on keflex several days after (was more erythematous at a re check)- and tolerating well  Redness and swelling are improved   Is stiff to bend due to the swelling  No numbness or weakness     Patient Active Problem List   Diagnosis Date Noted  . B12 deficiency 08/14/2014  . Low back pain 05/02/2014  . Urinary frequency 05/02/2014  . Tick bites 02/14/2014  . Encounter for Medicare annual wellness exam 07/27/2013  . Colon cancer screening 07/27/2013  . Contact dermatitis 05/10/2013  . Motion sickness 04/04/2013  . Osteopenia 06/14/2012  . Elevated TSH 06/14/2012  . MACROCYTIC ANEMIA 07/29/2010  . 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 FECAL OCCULT BLOOD 02/01/2010  . Essential hypertension 02/23/2009  . Carotid artery stenosis 02/23/2009  . SPONDYLOSIS, LUMBAR 02/23/2009  . NECK PAIN, CHRONIC 02/23/2009  . BACK PAIN, CHRONIC 02/23/2009  . COMPRESSION FRACTURE 02/23/2009  . HYPERCHOLESTEROLEMIA 02/21/2009  . NEOPLASM, MALIGNANT, PROSTATE, HX OF 02/21/2009   Past Medical History  Diagnosis Date  . Hypertension   . Carotid artery stenosis   . Hyperlipidemia   . History of prostate cancer   . Degenerative disc disease   . Kyphosis   . Fractures     compression  . Non-cardiac chest pain 04/11    hospital ? [facility]  . Cancer     prostate   Past Surgical History  Procedure Laterality Date  . Prostate surgery    . Herniated disc surgery  1990    L3-4  . Inguinal hernia repair  1979    right  . Tonsillectomy    . Prostate cancer surgery    . Cardiovascular stress test  2004    cardiolite negative  . Carotid doppler  11/05    0-39% bilatteral   History  Substance Use Topics  . Smoking status: Never Smoker   . Smokeless tobacco: Not on file  . Alcohol Use: 0.0 oz/week    0 Standard drinks or equivalent per  week     Comment: 2 drinks per day   Family History  Problem Relation Age of Onset  . Hypertension Father    No Known Allergies Current Outpatient Prescriptions on File Prior to Visit  Medication Sig Dispense Refill  . aspirin EC 81 MG EC tablet Take 81 mg by mouth daily.      . cephALEXin (KEFLEX) 500 MG capsule Take 1 capsule (500 mg total) by mouth 3 (three) times daily. 21 capsule 0  . cyclobenzaprine (FLEXERIL) 10 MG tablet Take 1/2 tablet by mouth up to every 8 hours as needed for back pain 30 tablet 0  . guaiFENesin (MUCINEX) 600 MG 12 hr tablet Take 1,200 mg by mouth as needed.    . Loratadine 10 MG CAPS Take by mouth as needed.    . Selenium (SELENIMIN-200 PO) Take by mouth daily.      . simvastatin (ZOCOR) 20 MG tablet TAKE 1 TABLET BY MOUTH AT BEDTIME 90 tablet 1  . trandolapril (MAVIK) 2 MG tablet Take 1 tablet (2 mg total) by mouth daily. 90 tablet 1  . vitamin B-12 (CYANOCOBALAMIN) 100 MCG tablet Take 100 mcg by mouth. 3 times a week    . VITAMIN D, CHOLECALCIFEROL, PO Take 1 tablet by mouth daily.  No current facility-administered medications on file prior to visit.    Review of Systems Review of Systems  Constitutional: Negative for fever, appetite change, fatigue and unexpected weight change.  Eyes: Negative for pain and visual disturbance.  Respiratory: Negative for cough and shortness of breath.   Cardiovascular: Negative for cp or palpitations    Gastrointestinal: Negative for nausea, diarrhea and constipation.  Genitourinary: Negative for urgency and frequency.  Skin: Negative for pallor or rash  pos for abrasion of hand which is sore / neg for drainage  Neurological: Negative for weakness, light-headedness, numbness and headaches.  Hematological: Negative for adenopathy. Does not bruise/bleed easily.  Psychiatric/Behavioral: Negative for dysphoric mood. The patient is not nervous/anxious.         Objective:   Physical Exam  Constitutional: He appears  well-developed and well-nourished. No distress.  HENT:  Head: Normocephalic and atraumatic.  Neck: Normal range of motion. Neck supple.  Cardiovascular: Normal rate and regular rhythm.   Pulmonary/Chest: Effort normal and breath sounds normal.  Musculoskeletal: He exhibits edema and tenderness.  R 2-4th fingers - improved from last check  Nl perf and sens and rom   Lymphadenopathy:    He has no cervical adenopathy.  Neurological: He is alert. He has normal reflexes.  Skin:  R 2nd-4th fingers are abraded from recent trauma 3rd finger has remaining open wound with granulation tissue and no drainage Some erythema and swelling- improved from recent eval Nl rom of fingers with some discomfort           Assessment & Plan:   Problem List Items Addressed This Visit      Other   Injury of right hand - Primary    With abrasions to R 2nd/3rd/4th fingers  Middle finger affected the worst - 1 cm wound healing by 2ndary intention  Redness/swelling is improving with keflex-inst to finish that  Dress with abx oint and gauze  Wash with soap and water but do not submerge See AVS for callback s/s

## 2015-01-12 NOTE — Assessment & Plan Note (Signed)
With abrasions to R 2nd/3rd/4th fingers  Middle finger affected the worst - 1 cm wound healing by 2ndary intention  Redness/swelling is improving with keflex-inst to finish that  Dress with abx oint and gauze  Wash with soap and water but do not submerge See AVS for callback s/s

## 2015-01-22 NOTE — Progress Notes (Signed)
   Subjective:    Patient ID: [name], male    DOB: Dec 27, 1919, 81 y.o.   MRN: [id number]  HPI Opened in error    Review of Systems     Objective:   Physical Exam        Assessment & Plan:

## 2015-03-21 ENCOUNTER — Ambulatory Visit (INDEPENDENT_AMBULATORY_CARE_PROVIDER_SITE_OTHER): Payer: Medicare Other | Admitting: Family Medicine

## 2015-03-21 ENCOUNTER — Encounter: Payer: Self-pay | Admitting: Family Medicine

## 2015-03-21 VITALS — BP 124/68 | HR 54 | Temp 97.4°F | Ht 63.5 in | Wt 153.8 lb

## 2015-03-21 DIAGNOSIS — W57XXXA Bitten or stung by nonvenomous insect and other nonvenomous arthropods, initial encounter: Secondary | ICD-10-CM | POA: Diagnosis not present

## 2015-03-21 DIAGNOSIS — T148 Other injury of unspecified body region: Secondary | ICD-10-CM | POA: Diagnosis not present

## 2015-03-21 NOTE — Assessment & Plan Note (Signed)
Bite on L lower leg is removed and site is inflamed without target shaped rash or other findings  Small tick found on R lower leg and removed totally (not engorged) Disc care of bites Disc red flags for tick bourne illness- will update me if new symptoms

## 2015-03-21 NOTE — Patient Instructions (Signed)
Keep tick bites clean and dry  Antibiotic ointment is ok  Watch for a bullseye shaped lesion at either site Watch for rash anywhere Watch for fever or signs of illness  If any of the above-call so I can get you started on antibiotics

## 2015-03-21 NOTE — Progress Notes (Signed)
Pre visit review using our clinic review tool, if applicable. No additional management support is needed unless otherwise documented below in the visit note. 

## 2015-03-21 NOTE — Progress Notes (Signed)
Subjective:    Patient ID: [name], male    DOB: [birth year], 81 y.o.   MRN: [id number]  HPI Here for a tick bite  It bit him on the lower L leg  His wife used a magnifying glass and tweezers - and unsure if she got it all out   Some bruising in the area   Neighbor got RMSF  Was itchy-no longer is No fever/rash /joint swelling   Cleaned area with soap/water/ Bactine   Is bruised - after digging at it  No bullseye pattern   Tick was not engorged  Not pin head size   Patient Active Problem List   Diagnosis Date Noted  . Injury of right hand 01/12/2015  . B12 deficiency 08/14/2014  . Low back pain 05/02/2014  . Urinary frequency 05/02/2014  . Tick bites 02/14/2014  . Encounter for Medicare annual wellness exam 07/27/2013  . Colon cancer screening 07/27/2013  . Contact dermatitis 05/10/2013  . Motion sickness 04/04/2013  . Osteopenia 06/14/2012  . Elevated TSH 06/14/2012  . MACROCYTIC ANEMIA 07/29/2010  . 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 FECAL OCCULT BLOOD 02/01/2010  . Essential hypertension 02/23/2009  . Carotid artery stenosis 02/23/2009  . SPONDYLOSIS, LUMBAR 02/23/2009  . NECK PAIN, CHRONIC 02/23/2009  . BACK PAIN, CHRONIC 02/23/2009  . COMPRESSION FRACTURE 02/23/2009  . HYPERCHOLESTEROLEMIA 02/21/2009  . NEOPLASM, MALIGNANT, PROSTATE, HX OF 02/21/2009   Past Medical History  Diagnosis Date  . Hypertension   . Carotid artery stenosis   . Hyperlipidemia   . History of prostate cancer   . Degenerative disc disease   . Kyphosis   . Fractures     compression  . Non-cardiac chest pain 04/11    hospital ? [facility]  . Cancer     prostate   Past Surgical History  Procedure Laterality Date  . Prostate surgery    . Herniated disc surgery  1990    L3-4  . Inguinal hernia repair  1979    right  . Tonsillectomy    . Prostate cancer surgery    . Cardiovascular stress test  2004    cardiolite negative  . Carotid doppler  11/05    0-39% bilatteral   History    Substance Use Topics  . Smoking status: Never Smoker   . Smokeless tobacco: Not on file  . Alcohol Use: 0.0 oz/week    0 Standard drinks or equivalent per week     Comment: 2 drinks per day   Family History  Problem Relation Age of Onset  . Hypertension Father    No Known Allergies Current Outpatient Prescriptions on File Prior to Visit  Medication Sig Dispense Refill  . aspirin EC 81 MG EC tablet Take 81 mg by mouth daily.      . cyclobenzaprine (FLEXERIL) 10 MG tablet Take 1/2 tablet by mouth up to every 8 hours as needed for back pain 30 tablet 0  . guaiFENesin (MUCINEX) 600 MG 12 hr tablet Take 1,200 mg by mouth as needed.    . Loratadine 10 MG CAPS Take by mouth as needed.    . Selenium (SELENIMIN-200 PO) Take by mouth daily.      . trandolapril (MAVIK) 2 MG tablet Take 1 tablet (2 mg total) by mouth daily. 90 tablet 1  . vitamin B-12 (CYANOCOBALAMIN) 100 MCG tablet Take 100 mcg by mouth. 3 times a week    . VITAMIN D, CHOLECALCIFEROL, PO Take 1 tablet by mouth daily.      Marland Kitchen  simvastatin (ZOCOR) 20 MG tablet TAKE 1 TABLET BY MOUTH AT BEDTIME (Patient not taking: Reported on 03/21/2015) 90 tablet 1   No current facility-administered medications on file prior to visit.     Review of Systems Review of Systems  Constitutional: Negative for fever, appetite change, fatigue and unexpected weight change.  Eyes: Negative for pain and visual disturbance.  Respiratory: Negative for cough and shortness of breath.   Cardiovascular: Negative for cp or palpitations    Gastrointestinal: Negative for nausea, diarrhea and constipation.  Genitourinary: Negative for urgency and frequency.  Skin: Negative for pallor or rash   [facility] neg for joint swelling  Neurological: Negative for weakness, light-headedness, numbness and headaches.  Hematological: Negative for adenopathy. Does not bruise/bleed easily.  Psychiatric/Behavioral: Negative for dysphoric mood. The patient is not nervous/anxious.          Objective:   Physical Exam  Constitutional: He appears well-developed and well-nourished. No distress.  Frail appearing elderly male in no distress   Eyes: Conjunctivae and EOM are normal. Pupils are equal, round, and reactive to light.  Neck: Normal range of motion. Neck supple.  Cardiovascular: Normal rate and regular rhythm.   Pulmonary/Chest: Effort normal and breath sounds normal.  Musculoskeletal: He exhibits no edema or tenderness.  No joint swelling or changes   Lymphadenopathy:    He has no cervical adenopathy.  Neurological: He is alert.  Skin: Skin is warm and dry. No rash noted.  Tick bite site on L lower leg has superficial red bruising (with think skin) No target shape or excoriation  Tick present on R lower leg  Removed easily with tweezers (area cleaned and dressed with abx oint)  No rash /redness at the bite site   Psychiatric: He has a normal mood and affect.          Assessment & Plan:   Problem List Items Addressed This Visit    Tick bite - Primary    Bite on L lower leg is removed and site is inflamed without target shaped rash or other findings  Small tick found on R lower leg and removed totally (not engorged) Disc care of bites Disc red flags for tick bourne illness- will update me if new symptoms

## 2015-04-17 ENCOUNTER — Other Ambulatory Visit: Payer: Self-pay | Admitting: Family Medicine

## 2015-08-08 ENCOUNTER — Other Ambulatory Visit: Payer: Self-pay | Admitting: Family Medicine

## 2015-08-08 DIAGNOSIS — I6523 Occlusion and stenosis of bilateral carotid arteries: Secondary | ICD-10-CM

## 2015-08-20 ENCOUNTER — Telehealth: Payer: Self-pay | Admitting: Family Medicine

## 2015-08-20 ENCOUNTER — Ambulatory Visit (HOSPITAL_COMMUNITY)
Admission: RE | Admit: 2015-08-20 | Discharge: 2015-08-20 | Disposition: A | Discharge status: Home / Self Care | Payer: Medicare Other | Source: Ambulatory Visit | Attending: Cardiology | Admitting: Cardiology

## 2015-08-20 DIAGNOSIS — E7800 Pure hypercholesterolemia, unspecified: Secondary | ICD-10-CM

## 2015-08-20 DIAGNOSIS — I10 Essential (primary) hypertension: Secondary | ICD-10-CM | POA: Diagnosis not present

## 2015-08-20 DIAGNOSIS — E785 Hyperlipidemia, unspecified: Secondary | ICD-10-CM | POA: Insufficient documentation

## 2015-08-20 DIAGNOSIS — I6523 Occlusion and stenosis of bilateral carotid arteries: Secondary | ICD-10-CM | POA: Insufficient documentation

## 2015-08-20 DIAGNOSIS — E538 Deficiency of other specified B group vitamins: Secondary | ICD-10-CM

## 2015-08-20 DIAGNOSIS — Z8546 Personal history of malignant neoplasm of prostate: Secondary | ICD-10-CM

## 2015-08-20 NOTE — Telephone Encounter (Signed)
-----   Message from Ellamae Sia sent at 08/15/2015  3:45 PM EST ----- Regarding: Lab orders for Tuesday, 12.13.16 Patient is scheduled for CPX labs, please order future labs, Thanks , Karna Christmas

## 2015-08-21 ENCOUNTER — Other Ambulatory Visit (INDEPENDENT_AMBULATORY_CARE_PROVIDER_SITE_OTHER): Payer: Medicare Other

## 2015-08-21 DIAGNOSIS — Z8546 Personal history of malignant neoplasm of prostate: Secondary | ICD-10-CM

## 2015-08-21 DIAGNOSIS — I10 Essential (primary) hypertension: Secondary | ICD-10-CM | POA: Diagnosis not present

## 2015-08-21 DIAGNOSIS — E7800 Pure hypercholesterolemia, unspecified: Secondary | ICD-10-CM

## 2015-08-21 DIAGNOSIS — E538 Deficiency of other specified B group vitamins: Secondary | ICD-10-CM

## 2015-08-21 LAB — LIPID PANEL
Cholesterol: 167 mg/dL (ref 0–200)
HDL: 43.2 mg/dL (ref >39.00)
LDL Cholesterol: 106 mg/dL — ABNORMAL HIGH (ref 0–99)
NonHDL: 124.26
Total CHOL/HDL Ratio: 4
Triglycerides: 90 mg/dL (ref 0.0–149.0)
VLDL: 18 mg/dL (ref 0.0–40.0)

## 2015-08-21 LAB — COMPREHENSIVE METABOLIC PANEL
ALT: 13 U/L (ref 0–53)
AST: 21 U/L (ref 0–37)
Albumin: 3.9 g/dL (ref 3.5–5.2)
Alkaline Phosphatase: 51 U/L (ref 39–117)
BUN: 21 mg/dL (ref 6–23)
CO2: 29 mEq/L (ref 19–32)
Calcium: 9.4 mg/dL (ref 8.4–10.5)
Chloride: 104 mEq/L (ref 96–112)
Creatinine, Ser: 1.2 mg/dL (ref 0.40–1.50)
GFR: 60 mL/min — ABNORMAL LOW (ref >60.00)
Glucose, Bld: 102 mg/dL — ABNORMAL HIGH (ref 70–99)
Potassium: 4.6 mEq/L (ref 3.5–5.1)
Sodium: 138 mEq/L (ref 135–145)
Total Bilirubin: 0.6 mg/dL (ref 0.2–1.2)
Total Protein: 6.2 g/dL (ref 6.0–8.3)

## 2015-08-21 LAB — CBC WITH DIFFERENTIAL/PLATELET
Basophils Absolute: 0 10*3/uL (ref 0.0–0.1)
Basophils Relative: 0.4 % (ref 0.0–3.0)
Eosinophils Absolute: 0.2 10*3/uL (ref 0.0–0.7)
Eosinophils Relative: 3.2 % (ref 0.0–5.0)
HCT: 40.9 % (ref 39.0–52.0)
Hemoglobin: 13.6 g/dL (ref 13.0–17.0)
Lymphocytes Relative: 17.6 % (ref 12.0–46.0)
Lymphs Abs: 1.2 10*3/uL (ref 0.7–4.0)
MCHC: 33.2 g/dL (ref 30.0–36.0)
MCV: 99 fl (ref 78.0–100.0)
Monocytes Absolute: 0.9 10*3/uL (ref 0.1–1.0)
Monocytes Relative: 13.5 % — ABNORMAL HIGH (ref 3.0–12.0)
Neutro Abs: 4.5 10*3/uL (ref 1.4–7.7)
Neutrophils Relative %: 65.3 % (ref 43.0–77.0)
Platelets: 323 10*3/uL (ref 150.0–400.0)
RBC: 4.13 Mil/uL — ABNORMAL LOW (ref 4.22–5.81)
RDW: 13.4 % (ref 11.5–15.5)
WBC: 6.8 10*3/uL (ref 4.0–10.5)

## 2015-08-21 LAB — TSH: TSH: 5.69 u[IU]/mL — ABNORMAL HIGH (ref 0.35–4.50)

## 2015-08-21 LAB — PSA: PSA: 0.4 ng/mL (ref 0.10–4.00)

## 2015-08-21 LAB — VITAMIN B12: Vitamin B-12: 1043 pg/mL — ABNORMAL HIGH (ref 211–911)

## 2015-08-24 ENCOUNTER — Ambulatory Visit (INDEPENDENT_AMBULATORY_CARE_PROVIDER_SITE_OTHER): Payer: Medicare Other | Admitting: Family Medicine

## 2015-08-24 ENCOUNTER — Encounter: Payer: Self-pay | Admitting: Family Medicine

## 2015-08-24 VITALS — BP 132/62 | HR 66 | Temp 98.2°F | Ht 62.75 in | Wt 149.5 lb

## 2015-08-24 DIAGNOSIS — M4000 Postural kyphosis, site unspecified: Secondary | ICD-10-CM

## 2015-08-24 DIAGNOSIS — R946 Abnormal results of thyroid function studies: Secondary | ICD-10-CM

## 2015-08-24 DIAGNOSIS — Z0000 Encounter for general adult medical examination without abnormal findings: Secondary | ICD-10-CM

## 2015-08-24 DIAGNOSIS — I10 Essential (primary) hypertension: Secondary | ICD-10-CM

## 2015-08-24 DIAGNOSIS — E7800 Pure hypercholesterolemia, unspecified: Secondary | ICD-10-CM

## 2015-08-24 DIAGNOSIS — E538 Deficiency of other specified B group vitamins: Secondary | ICD-10-CM | POA: Diagnosis not present

## 2015-08-24 DIAGNOSIS — M8580 Other specified disorders of bone density and structure, unspecified site: Secondary | ICD-10-CM | POA: Diagnosis not present

## 2015-08-24 DIAGNOSIS — Z1211 Encounter for screening for malignant neoplasm of colon: Secondary | ICD-10-CM

## 2015-08-24 DIAGNOSIS — I6529 Occlusion and stenosis of unspecified carotid artery: Secondary | ICD-10-CM

## 2015-08-24 DIAGNOSIS — R7989 Other specified abnormal findings of blood chemistry: Secondary | ICD-10-CM

## 2015-08-24 MED ORDER — TRANDOLAPRIL 2 MG PO TABS: 2.0000 mg | ORAL_TABLET | Freq: Every day | ORAL | Status: DC

## 2015-08-24 NOTE — Assessment & Plan Note (Signed)
Reviewed health habits including diet and exercise and skin cancer prevention Reviewed appropriate screening tests for age  Also reviewed health mt list, fam hx and immunization status , as well as social and family history   See HPI Labs reviewed and stable  Enc to get back on simvastatin and try co Q10 to see if it helps with side eff  Declines dexa Declines colon cancer screen given his age

## 2015-08-24 NOTE — Assessment & Plan Note (Signed)
Worsening Pt declines further dexa however or tx for [facility]

## 2015-08-24 NOTE — Assessment & Plan Note (Signed)
Reviewed health habits including diet and exercise and skin cancer prevention Reviewed appropriate screening tests for age  Also reviewed health mt list, fam hx and immunization status , as well as social and family history   See HPI Labs reviewed and stable  Enc to get back on simvastatin and try co Q10 to see if it helps with side eff  Declines dexa Declines colon cancer screen given his age  psa is stable (hx of bph with prostate surgery in the past)- still has some incontinence at night

## 2015-08-24 NOTE — Assessment & Plan Note (Signed)
Recent stable carotid doppler  Due again in a year

## 2015-08-24 NOTE — Assessment & Plan Note (Signed)
Stable  Last T4 nl and no symptoms  Will continue to watch

## 2015-08-24 NOTE — Progress Notes (Signed)
Subjective:    Patient ID: [name], male    DOB: 05-09-1920, 81 y.o.   MRN: [id number]  HPI Here for annual medicare wellness visit as well as chronic/acute medical problems as well as annual preventative exam  I have personally reviewed the Medicare Annual Wellness questionnaire and have noted 1. The patient's medical and social history 2. Their use of alcohol, tobacco or illicit drugs 3. Their current medications and supplements 4. The patient's functional ability including ADL's, fall risks, home safety risks and hearing or visual             impairment. 5. Diet and physical activities 6. Evidence for depression or mood disorders  The patients weight, height, BMI have been recorded in the chart and visual acuity is per eye clinic.  I have made referrals, counseling and provided education to the patient based review of the above and I have provided the pt with a written personalized care plan for preventive services. Reviewed and updated provider list, see scanned forms.  See scanned forms.  Routine anticipatory guidance given to patient.  See health maintenance. Colon cancer screening had ifob kit neg 11/14- does not want to screen anymore   Flu vaccine 9/16 Tetanus vaccine 10/13  Pneumovax complete on both vaccines  Zoster vaccine 10/06  Prostate cancer screening-  Lab Results  Component Value Date   PSA 0.40 08/21/2015   PSA 0.43 08/14/2014   PSA 0.44 07/21/2013   does get up a night to urinate - he tolerates that  Past hx of prostatectomy Advance directive-- has a living will and poa  Cognitive function addressed- see scanned forms- and if abnormal then additional documentation follows. Thinks he is pretty good -no major problems occ misplaces objects-that is all    PMH and SH reviewed  Meds, vitals, and allergies reviewed.   ROS: See HPI.  Otherwise negative.     Wt is down 4 lb with bmi of 26 He is trying to eat healthier   Carotid doppler was  stable-no change  Not due for another year   bp is stable today  No cp or palpitations or headaches or edema  No side effects to medicines  BP Readings from Last 3 Encounters:  08/24/15 132/62  03/21/15 124/68  01/12/15 134/68     Hx of osteopenia but he had a nl dexa in 01   Elevated tsh in the past Lab Results  Component Value Date   TSH 5.69* 08/21/2015   he does not take thyroid med  Nl T4 last time No symptoms     Cholesterol  Is weaning off of his simvastatin due to pain  Lab Results  Component Value Date   CHOL 167 08/21/2015   CHOL 163 08/14/2014   CHOL 149 07/21/2013   Lab Results  Component Value Date   HDL 43.20 08/21/2015   HDL 46.10 08/14/2014   HDL 45.00 07/21/2013   Lab Results  Component Value Date   LDLCALC 106* 08/21/2015   LDLCALC 98 08/14/2014   LDLCALC 85 07/21/2013   Lab Results  Component Value Date   TRIG 90.0 08/21/2015   TRIG 94.0 08/14/2014   TRIG 96.0 07/21/2013   Lab Results  Component Value Date   CHOLHDL 4 08/21/2015   CHOLHDL 4 08/14/2014   CHOLHDL 3 07/21/2013   No results found for: LDLDIRECT  Would consider co Q 10 - ? If it helped with pain Also considering fish oil  Chemistry      Component Value Date/Time   NA 138 08/21/2015 0849   K 4.6 08/21/2015 0849   CL 104 08/21/2015 0849   CO2 29 08/21/2015 0849   BUN 21 08/21/2015 0849   CREATININE 1.20 08/21/2015 0849      Component Value Date/Time   CALCIUM 9.4 08/21/2015 0849   ALKPHOS 51 08/21/2015 0849   AST 21 08/21/2015 0849   ALT 13 08/21/2015 0849   BILITOT 0.6 08/21/2015 0849     glucose 102   Lab Results  Component Value Date   WBC 6.8 08/21/2015   HGB 13.6 08/21/2015   HCT 40.9 08/21/2015   MCV 99.0 08/21/2015   PLT 323.0 08/21/2015    Takes his B12  Eats a balanced diet Lab Results  Component Value Date   [id number] 2440* 08/21/2015     Patient Active Problem List   Diagnosis Date Noted  . Routine general medical examination  at a health care facility 08/24/2015  . Tick bite 03/21/2015  . Injury of right hand 01/12/2015  . B12 deficiency 08/14/2014  . Low back pain 05/02/2014  . Urinary frequency 05/02/2014  . Tick bites 02/14/2014  . Encounter for Medicare annual wellness exam 07/27/2013  . Colon cancer screening 07/27/2013  . Contact dermatitis 05/10/2013  . Motion sickness 04/04/2013  . Osteopenia 06/14/2012  . Elevated TSH 06/14/2012  . MACROCYTIC ANEMIA 07/29/2010  . 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 FECAL OCCULT BLOOD 02/01/2010  . Essential hypertension 02/23/2009  . Carotid artery stenosis 02/23/2009  . SPONDYLOSIS, LUMBAR 02/23/2009  . NECK PAIN, CHRONIC 02/23/2009  . BACK PAIN, CHRONIC 02/23/2009  . COMPRESSION FRACTURE 02/23/2009  . HYPERCHOLESTEROLEMIA 02/21/2009  . NEOPLASM, MALIGNANT, PROSTATE, HX OF 02/21/2009   Past Medical History  Diagnosis Date  . Hypertension   . Carotid artery stenosis   . Hyperlipidemia   . History of prostate cancer   . Degenerative disc disease   . Kyphosis   . Fractures     compression  . Non-cardiac chest pain 04/11    hospital ? [facility]  . Cancer [facility])     prostate   Past Surgical History  Procedure Laterality Date  . Prostate surgery    . Herniated disc surgery  1990    L3-4  . Inguinal hernia repair  1979    right  . Tonsillectomy    . Prostate cancer surgery    . Cardiovascular stress test  2004    cardiolite negative  . Carotid doppler  11/05    0-39% bilatteral   Social History  Substance Use Topics  . Smoking status: Never Smoker   . Smokeless tobacco: None  . Alcohol Use: 0.0 oz/week    0 Standard drinks or equivalent per week     Comment: 2 drinks per day   Family History  Problem Relation Age of Onset  . Hypertension Father    No Known Allergies Current Outpatient Prescriptions on File Prior to Visit  Medication Sig Dispense Refill  . aspirin EC 81 MG EC tablet Take 81 mg by mouth daily.      . cyclobenzaprine (FLEXERIL) 10 MG  tablet Take 1/2 tablet by mouth up to every 8 hours as needed for back pain 30 tablet 0  . guaiFENesin (MUCINEX) 600 MG 12 hr tablet Take 1,200 mg by mouth as needed.    . Loratadine 10 MG CAPS Take by mouth as needed.    . Selenium (SELENIMIN-200 PO) Take by mouth daily.      Marland Kitchen  trandolapril (MAVIK) 2 MG tablet TAKE 1 TABLET BY MOUTH EVERY DAY 90 tablet 1  . vitamin B-12 (CYANOCOBALAMIN) 100 MCG tablet Take 100 mcg by mouth. 3 times a week    . VITAMIN D, CHOLECALCIFEROL, PO Take 1 tablet by mouth daily.      . simvastatin (ZOCOR) 20 MG tablet TAKE 1 TABLET BY MOUTH AT BEDTIME (Patient not taking: Reported on 08/24/2015) 90 tablet 1   No current facility-administered medications on file prior to visit.     Review of Systems Review of Systems  Constitutional: Negative for fever, appetite change, fatigue and unexpected weight change.  Eyes: Negative for pain and visual disturbance.  Respiratory: Negative for cough and shortness of breath.   Cardiovascular: Negative for cp or palpitations    Gastrointestinal: Negative for nausea, diarrhea and constipation.  Genitourinary: Negative for urgency and frequency.  Skin: Negative for pallor or rash   [facility] pos for joint stiffness/pos for muscle pain when on cholesterol medicine  Neurological: Negative for weakness, light-headedness, numbness and headaches.  Hematological: Negative for adenopathy. Does not bruise/bleed easily.  Psychiatric/Behavioral: Negative for dysphoric mood. The patient is not nervous/anxious.         Objective:   Physical Exam  Constitutional: He appears well-developed and well-nourished. No distress.  HENT:  Head: Normocephalic and atraumatic.  Right Ear: External ear normal.  Left Ear: External ear normal.  Nose: Nose normal.  Mouth/Throat: Oropharynx is clear and moist.  Eyes: Conjunctivae and EOM are normal. Pupils are equal, round, and reactive to light. Right eye exhibits no discharge. Left eye exhibits no  discharge. No scleral icterus.  Neck: Normal range of motion. Neck supple. No JVD present. Carotid bruit is present. No thyromegaly present.  Cardiovascular: Normal rate, regular rhythm, normal heart sounds and intact distal pulses.  Exam reveals no gallop.   Pulmonary/Chest: Effort normal and breath sounds normal. No respiratory distress. He has no wheezes. He exhibits no tenderness.  Abdominal: Soft. Bowel sounds are normal. He exhibits no distension, no abdominal bruit and no mass. There is no tenderness.  Musculoskeletal: He exhibits no edema or tenderness.  Significant kyphosis  Lymphadenopathy:    He has no cervical adenopathy.  Neurological: He is alert. He has normal reflexes. No cranial nerve deficit. He exhibits normal muscle tone. Coordination normal.  Skin: Skin is warm and dry. No rash noted. No erythema. No pallor.  Some lentigo and SKs  Psychiatric: He has a normal mood and affect.          Assessment & Plan:   Problem List Items Addressed This Visit      Cardiovascular and Mediastinum   Carotid artery stenosis    Recent stable carotid doppler  Due again in a year       Relevant Medications   trandolapril (MAVIK) 2 MG tablet   Essential hypertension - Primary    bp in fair control at this time  BP Readings from Last 1 Encounters:  08/24/15 132/62   No changes needed Disc lifstyle change with low sodium diet and exercise  Labs reviewed       Relevant Medications   trandolapril (MAVIK) 2 MG tablet     Digestive   B12 deficiency    Level is supratherapeutic on oral supplementation - he feels good taking it         Musculoskeletal and Integument   KYPHOSIS    Worsening Pt declines further dexa however or tx for [facility]  Osteopenia    Declines further dexa or tx  No falls or fx  Reminded about ca and D  Disc need for calcium/ vitamin D/ wt bearing exercise and bone density test every 2 y to monitor Disc safety/ fracture risk in detail           Other   Colon cancer screening    Pt declines further colon cancer screening at his age  IFOB nl in 2014      Elevated TSH    Stable  Last T4 nl and no symptoms  Will continue to watch       Encounter for Medicare annual wellness exam    Reviewed health habits including diet and exercise and skin cancer prevention Reviewed appropriate screening tests for age  Also reviewed health mt list, fam hx and immunization status , as well as social and family history   See HPI Labs reviewed and stable  Enc to get back on simvastatin and try co Q10 to see if it helps with side eff  Declines dexa Declines colon cancer screen given his age       61    Disc goals for lipids and reasons to control them Rev labs with pt Rev low sat fat diet in detail Enc pt to try his simvastatin again - with coenzyme Q10 - see if this helps leg pain  Will report back       Relevant Medications   trandolapril (MAVIK) 2 MG tablet   Routine general medical examination at a health care facility    Reviewed health habits including diet and exercise and skin cancer prevention Reviewed appropriate screening tests for age  Also reviewed health mt list, fam hx and immunization status , as well as social and family history   See HPI Labs reviewed and stable  Enc to get back on simvastatin and try co Q10 to see if it helps with side eff  Declines dexa Declines colon cancer screen given his age  psa is stable (hx of bph with prostate surgery in the past)- still has some incontinence at night

## 2015-08-24 NOTE — Assessment & Plan Note (Signed)
bp in fair control at this time  BP Readings from Last 1 Encounters:  08/24/15 132/62   No changes needed Disc lifstyle change with low sodium diet and exercise  Labs reviewed

## 2015-08-24 NOTE — Assessment & Plan Note (Signed)
Declines further dexa or tx  No falls or fx  Reminded about ca and D  Disc need for calcium/ vitamin D/ wt bearing exercise and bone density test every 2 y to monitor Disc safety/ fracture risk in detail

## 2015-08-24 NOTE — Patient Instructions (Signed)
Take care of yourself  Immunizations are up to date  Labs are stable  Try coenzyme Q10 over the counter to help the pain that the simvastatin causes  Fish oil is ok also  If you want to do a bone density test at any time please let me know - (Try to get 1200-1500 mg of calcium per day with at least 1000 iu of vitamin D - for bone health)- I notice you are becoming more hunched over

## 2015-08-24 NOTE — Assessment & Plan Note (Signed)
Level is supratherapeutic on oral supplementation - he feels good taking it

## 2015-08-24 NOTE — Assessment & Plan Note (Signed)
Pt declines further colon cancer screening at his age  IFOB nl in 2014

## 2015-08-24 NOTE — Progress Notes (Signed)
Pre visit review using our clinic review tool, if applicable. No additional management support is needed unless otherwise documented below in the visit note. 

## 2015-08-24 NOTE — Assessment & Plan Note (Signed)
Disc goals for lipids and reasons to control them Rev labs with pt Rev low sat fat diet in detail Enc pt to try his simvastatin again - with coenzyme Q10 - see if this helps leg pain  Will report back

## 2016-04-14 ENCOUNTER — Encounter: Payer: Self-pay | Admitting: Internal Medicine

## 2016-04-14 ENCOUNTER — Ambulatory Visit (INDEPENDENT_AMBULATORY_CARE_PROVIDER_SITE_OTHER): Payer: Medicare Other | Admitting: Internal Medicine

## 2016-04-14 VITALS — BP 122/60 | HR 58 | Temp 97.6°F | Wt 153.0 lb

## 2016-04-14 DIAGNOSIS — Y92099 Unspecified place in other non-institutional residence as the place of occurrence of the external cause: Secondary | ICD-10-CM

## 2016-04-14 DIAGNOSIS — W19XXXA Unspecified fall, initial encounter: Secondary | ICD-10-CM | POA: Diagnosis not present

## 2016-04-14 DIAGNOSIS — S0191XA Laceration without foreign body of unspecified part of head, initial encounter: Secondary | ICD-10-CM

## 2016-04-14 DIAGNOSIS — Y92009 Unspecified place in unspecified non-institutional (private) residence as the place of occurrence of the external cause: Principal | ICD-10-CM

## 2016-04-14 NOTE — Progress Notes (Signed)
Subjective:    Patient ID: [name], male    DOB: 11/28/1919, 82 y.o.   MRN: [id number]  HPI  Pt presents to the clinic today with a complaint of a head laceration from a fall 3 days ago.  He reports he was sitting in a chair, and when he stood up his right knee gave out and he fell and hit his head on a bookcase.  He denies loss of consciousness.  He states that it bled at the time of the injury, but he was able to stop the bleeding.  He reports that his wife told him it was inches long at first, and has now improved to about 1 inch in length.  He denies pain, headaches, changes in vision, dizziness, nausea, or vomiting.  He has tried bandaging the area, Bactin, and Neosporin.    Review of Systems   Past Medical History:  Diagnosis Date  . Cancer [facility])    prostate  . Carotid artery stenosis   . Degenerative disc disease   . Fractures    compression  . History of prostate cancer   . Hyperlipidemia   . Hypertension   . Kyphosis   . Non-cardiac chest pain 04/11   hospital ? [facility]    Current Outpatient Prescriptions  Medication Sig Dispense Refill  . aspirin EC 81 MG EC tablet Take 81 mg by mouth daily.      . cyclobenzaprine (FLEXERIL) 10 MG tablet Take 1/2 tablet by mouth up to every 8 hours as needed for back pain 30 tablet 0  . guaiFENesin (MUCINEX) 600 MG 12 hr tablet Take 1,200 mg by mouth as needed.    . Loratadine 10 MG CAPS Take by mouth as needed.    . Selenium (SELENIMIN-200 PO) Take by mouth daily.      . simvastatin (ZOCOR) 20 MG tablet TAKE 1 TABLET BY MOUTH AT BEDTIME 90 tablet 1  . trandolapril (MAVIK) 2 MG tablet Take 1 tablet (2 mg total) by mouth daily. 90 tablet 3  . vitamin B-12 (CYANOCOBALAMIN) 100 MCG tablet Take 100 mcg by mouth. 3 times a week    . VITAMIN D, CHOLECALCIFEROL, PO Take 1 tablet by mouth daily.       No current facility-administered medications for this visit.     No Known Allergies  Family History  Problem Relation Age of Onset    . Hypertension Father     Social History   Social History  . Marital status: Married    Spouse name: N/A  . Number of children: N/A  . Years of education: N/A   Occupational History  . 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 Smoking status: Never Smoker  . Smokeless tobacco: Not on file  . Alcohol use 0.0 oz/week     Comment: 2 drinks per day  . Drug use: No  . Sexual activity: Not on file   Other Topics Concern  . 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 Skin: Pt reports laceration to top of head. HEENT: Denies changes in vision. GI: Denies nausea or vomiting.  No other specific complaints in a complete review of systems (except as listed in HPI above).      Objective:   Physical Exam  BP 122/60   Pulse Marland Kitchen)  58   Temp 97.6 F (36.4 C) (Oral)   Wt 153 lb (69.4 kg)   SpO2 98%   BMI 27.32 kg/m   General: Well appearing, in no acute distress. Skin: 1.5 inch laceration on top of head, skin intact, scab present, nontender to palpation, no erythema or discharge noted. Pulm: Clear to auscultation bilaterally.  No wheezes, rales, or rhonchi. CV: Regular rate and rhythm.  No murmurs, rubs, or gallops. Neuro:  Cranial nerves intact.  Sensation to light touch intact throughout.  Able to perform finger-to-nose test.  Negative Romberg.      Assessment & Plan:   Head laceration s/p fall at home:  Healing well, skin intact Continue Neosporin once a day Clean with soap and water, pat dry Care instructions provided  RTC as needed Webb Silversmith, NP

## 2016-04-14 NOTE — Progress Notes (Signed)
Pre visit review using our clinic review tool, if applicable. No additional management support is needed unless otherwise documented below in the visit note. 

## 2016-04-14 NOTE — Patient Instructions (Signed)

## 2016-05-01 ENCOUNTER — Telehealth: Payer: Self-pay | Admitting: Family Medicine

## 2016-05-01 NOTE — Telephone Encounter (Signed)
Pt notified of Dr. Marliss Coots comments and verbalized understanding. He has not developed any of the sxs Dr. Glori Bickers listed so pt advise to keep area clean and to f/u if he develops any new sxs

## 2016-05-01 NOTE — Telephone Encounter (Signed)
Patient Name: [name]  DOB: Mar 29, 1920    Initial Comment Caller states c/o tick bite, requests Rx.   Nurse Assessment  Nurse: Raphael Gibney, RN, Vanita Ingles Date/Time (Eastern Time): 05/01/2016 10:11:51 AM  Confirm and document reason for call. If symptomatic, describe symptoms. You must click the next button to save text entered. ---Caller states he has a tick bite. Spouse pulled it out with tweezers. His cousin got Lyme disease from a tick bite.  Has the patient traveled out of the country within the last 30 days? ---Not Applicable  Does the patient have any new or worsening symptoms? ---Yes  Will a triage be completed? ---Yes  Related visit to physician within the last 2 weeks? ---No  Does the PT have any chronic conditions? (i.e. diabetes, asthma, etc.) ---No  Is this a behavioral health or substance abuse call? ---No     Guidelines    Guideline Title Affirmed Question Affirmed Notes  Tick Bite [1] Probable deer tick AND [2] attached > 24 hours (or tick appears swollen, not flat) AND [3] occurred in an area where Lyme disease is common    Final Disposition User   Call PCP within 24 Hours Stringer, RN, Vera    Comments  Pt does not want to make appt but would like prophylactic antibiotics called in. Please call pt back and let him know about antibiotics.   Referrals  GO TO FACILITY REFUSED  GO TO FACILITY REFUSED  GO TO FACILITY REFUSED   Disagree/Comply: Disagree  Disagree/Comply Reason: Disagree with instructions

## 2016-05-01 NOTE — Telephone Encounter (Signed)
See previous team health note.

## 2016-05-01 NOTE — Telephone Encounter (Signed)
[place] Call [facility] Patient Name: [name] DOB: 01-12-1920 Initial Comment Caller has questions about tick bite ** requested to speak to [facility] Nurse Assessment Guidelines Guideline Title Affirmed Question Affirmed Notes Final Disposition User FINAL ATTEMPT MADE - no message left [place], Therapist, sports, [employer]

## 2016-05-01 NOTE — Telephone Encounter (Signed)
We do not treat with abx (due to risk of developing resistance) unless there are s/s of tick dz like bullseye rash/other rash/ fever/ST/ joint pain/headache or other new symptoms Keep tick bite clean  F/u if symptoms or other

## 2016-06-03 ENCOUNTER — Ambulatory Visit (INDEPENDENT_AMBULATORY_CARE_PROVIDER_SITE_OTHER): Payer: Medicare Other

## 2016-06-03 DIAGNOSIS — Z23 Encounter for immunization: Secondary | ICD-10-CM | POA: Diagnosis not present

## 2016-06-05 ENCOUNTER — Telehealth: Payer: Self-pay | Admitting: *Deleted

## 2016-06-05 NOTE — Telephone Encounter (Signed)
Pt was advise of Dr. Marliss Coots response and became upset, I tried to explain that we allowed his son to drop off due to his special needs. Pt again declined to schedule an appt and said if we can't just do a UA without him being seen then he doesn't want an appt and he thought that Dr. Glori Bickers would make an exception for him since "she called him while he was on [place] at the beach before", I tried to explain the policy to him and he still declined to make an appt and said he is going to just "let it go"

## 2016-06-05 NOTE — Telephone Encounter (Signed)
I called pt's home to relay info about pt's son, and pt advise me that he thinks he has a UTI (urinary frequency, urgency) and was on his way up here with a urine sample to drop off. I advise pt that he can't just drop off a urine sample with out a doctor's okay, and that Dr. Glori Bickers isn't in the office today. I advise pt to put sample in the fridge until he hears back from our office. Pt is requesting the okay from either Dr. Glori Bickers or another doctor in the office to drop of a urine sample. I advise pt that I can't guarantee another doctor will give the okay or not but I will check and see because pt want to bring this sample in the office today, and he said Dr. Glori Bickers knows his situation,  Message sent to Dr. Glori Bickers and Dr. Darnell Level

## 2016-06-05 NOTE — Telephone Encounter (Signed)
I need to see [name] and evaluate him.  We did a drop off ua for his son because he has special urinary issues and is difficut to get him into the office - generally I prefer a visit so I can examine him

## 2016-08-15 ENCOUNTER — Other Ambulatory Visit: Payer: Self-pay | Admitting: Family Medicine

## 2016-08-15 ENCOUNTER — Telehealth: Payer: Self-pay | Admitting: Cardiology

## 2016-08-15 DIAGNOSIS — I6523 Occlusion and stenosis of bilateral carotid arteries: Secondary | ICD-10-CM

## 2016-08-15 NOTE — Telephone Encounter (Signed)
New message  Pt would like to talk to Dr. Percival Spanish prior to appt on 12/13 for carotid procedure  Please call back

## 2016-08-15 NOTE — Telephone Encounter (Signed)
Spoke with pt states that he has carotid scan 08-20-14 and states that he would like Dr Percival Spanish to call him personally with theses results.  I informed pt of procedure to have nurses to call with results, pt refuses to make an appt to discuss results with Dr Percival Spanish, pt hasnt been seen for quite awhile/ever-I do not see an OV in [place]. Please advise

## 2016-08-19 NOTE — Telephone Encounter (Signed)
Date of the scan is 08-20-16

## 2016-08-20 ENCOUNTER — Ambulatory Visit (HOSPITAL_COMMUNITY)
Admission: RE | Admit: 2016-08-20 | Discharge: 2016-08-20 | Disposition: A | Discharge status: Home / Self Care | Payer: Medicare Other | Source: Ambulatory Visit | Attending: Cardiovascular Disease | Admitting: Cardiovascular Disease

## 2016-08-20 DIAGNOSIS — I6523 Occlusion and stenosis of bilateral carotid arteries: Secondary | ICD-10-CM | POA: Diagnosis not present

## 2016-08-25 NOTE — Telephone Encounter (Signed)
Result Notes   Notes Recorded by Tammi Sou, CMA on 08/22/2016 at 3:05 PM EST Pt notified of [place] results and Dr. Marliss Coots comments and copy of the report mailed to pt per pt request

## 2016-08-28 ENCOUNTER — Telehealth: Payer: Self-pay | Admitting: Family Medicine

## 2016-08-28 DIAGNOSIS — E538 Deficiency of other specified B group vitamins: Secondary | ICD-10-CM

## 2016-08-28 DIAGNOSIS — E559 Vitamin D deficiency, unspecified: Secondary | ICD-10-CM | POA: Insufficient documentation

## 2016-08-28 DIAGNOSIS — Z0000 Encounter for general adult medical examination without abnormal findings: Secondary | ICD-10-CM

## 2016-08-28 NOTE — Telephone Encounter (Signed)
-----   Message from Eustace Pen, LPN sent at [phone]  4:28 PM EST ----- Regarding: Lab Orders 08/29/16 Please place lab orders. Thank you.

## 2016-08-29 ENCOUNTER — Ambulatory Visit: Payer: Medicare Other

## 2016-08-29 ENCOUNTER — Ambulatory Visit (INDEPENDENT_AMBULATORY_CARE_PROVIDER_SITE_OTHER): Payer: Medicare Other

## 2016-08-29 VITALS — BP 122/62 | HR 61 | Temp 97.9°F | Ht 62.0 in | Wt 151.2 lb

## 2016-08-29 DIAGNOSIS — E538 Deficiency of other specified B group vitamins: Secondary | ICD-10-CM | POA: Diagnosis not present

## 2016-08-29 DIAGNOSIS — E559 Vitamin D deficiency, unspecified: Secondary | ICD-10-CM | POA: Diagnosis not present

## 2016-08-29 DIAGNOSIS — Z0000 Encounter for general adult medical examination without abnormal findings: Secondary | ICD-10-CM

## 2016-08-29 LAB — CBC WITH DIFFERENTIAL/PLATELET
Basophils Absolute: 0 10*3/uL (ref 0.0–0.1)
Basophils Relative: 0.4 % (ref 0.0–3.0)
Eosinophils Absolute: 0.2 10*3/uL (ref 0.0–0.7)
Eosinophils Relative: 1.8 % (ref 0.0–5.0)
HCT: 36.5 % — ABNORMAL LOW (ref 39.0–52.0)
Hemoglobin: 12.6 g/dL — ABNORMAL LOW (ref 13.0–17.0)
Lymphocytes Relative: 10.7 % — ABNORMAL LOW (ref 12.0–46.0)
Lymphs Abs: 1 10*3/uL (ref 0.7–4.0)
MCHC: 34.7 g/dL (ref 30.0–36.0)
MCV: 96.3 fl (ref 78.0–100.0)
Monocytes Absolute: 1.1 10*3/uL — ABNORMAL HIGH (ref 0.1–1.0)
Monocytes Relative: 11.5 % (ref 3.0–12.0)
Neutro Abs: 7.3 10*3/uL (ref 1.4–7.7)
Neutrophils Relative %: 75.6 % (ref 43.0–77.0)
Platelets: 324 10*3/uL (ref 150.0–400.0)
RBC: 3.79 Mil/uL — ABNORMAL LOW (ref 4.22–5.81)
RDW: 12.8 % (ref 11.5–15.5)
WBC: 9.6 10*3/uL (ref 4.0–10.5)

## 2016-08-29 LAB — COMPREHENSIVE METABOLIC PANEL
ALT: 12 U/L (ref 0–53)
AST: 18 U/L (ref 0–37)
Albumin: 3.9 g/dL (ref 3.5–5.2)
Alkaline Phosphatase: 50 U/L (ref 39–117)
BUN: 28 mg/dL — ABNORMAL HIGH (ref 6–23)
CO2: 26 mEq/L (ref 19–32)
Calcium: 9.4 mg/dL (ref 8.4–10.5)
Chloride: 103 mEq/L (ref 96–112)
Creatinine, Ser: 1.11 mg/dL (ref 0.40–1.50)
GFR: 65.51 mL/min (ref >60.00)
Glucose, Bld: 95 mg/dL (ref 70–99)
Potassium: 4.6 mEq/L (ref 3.5–5.1)
Sodium: 135 mEq/L (ref 135–145)
Total Bilirubin: 0.5 mg/dL (ref 0.2–1.2)
Total Protein: 6.7 g/dL (ref 6.0–8.3)

## 2016-08-29 LAB — LIPID PANEL
Cholesterol: 158 mg/dL (ref 0–200)
HDL: 41.1 mg/dL (ref >39.00)
LDL Cholesterol: 93 mg/dL (ref 0–99)
NonHDL: 116.7
Total CHOL/HDL Ratio: 4
Triglycerides: 120 mg/dL (ref 0.0–149.0)
VLDL: 24 mg/dL (ref 0.0–40.0)

## 2016-08-29 LAB — VITAMIN B12: Vitamin B-12: 1166 pg/mL — ABNORMAL HIGH (ref 211–911)

## 2016-08-29 LAB — TSH: TSH: 4.7 u[IU]/mL — ABNORMAL HIGH (ref 0.35–4.50)

## 2016-08-29 LAB — VITAMIN D 25 HYDROXY (VIT D DEFICIENCY, FRACTURES): VITD: 30.13 ng/mL (ref 30.00–100.00)

## 2016-08-29 NOTE — Progress Notes (Signed)
PCP notes:   Health maintenance:  No gaps identified.   Abnormal screenings:   Hearing - failed Fall - hx of multiple falls without injury  Patient concerns:   None  Nurse concerns:  None  Next PCP appt:   09/05/16 @ 1115  I reviewed health advisor's note, was available for consultation, and agree with documentation and plan. Loura Pardon MD

## 2016-08-29 NOTE — Progress Notes (Signed)
Pre visit review using our clinic review tool, if applicable. No additional management support is needed unless otherwise documented below in the visit note. 

## 2016-08-29 NOTE — Patient Instructions (Signed)
[name] , Thank you for taking time to come for your Medicare Wellness Visit. I appreciate your ongoing commitment to your health goals. Please review the following plan we discussed and let me know if I can assist you in the future.   These are the goals we discussed: Goals    . Increase physical activity          Starting 08/29/2016, I will continue to walk at least 60 min daily.        This is a list of the screening recommended for you and due dates:  Health Maintenance  Topic Date Due  . Tetanus Vaccine  06/14/2022  . Flu Shot  Addressed  . Shingles Vaccine  Completed  . Pneumonia vaccines  Completed   Preventive Care for Adults  A healthy lifestyle and preventive care can promote health and wellness. Preventive health guidelines for adults include the following key practices.  . A routine yearly physical is a good way to check with your health care provider about your health and preventive screening. It is a chance to share any concerns and updates on your health and to receive a thorough exam.  . Visit your dentist for a routine exam and preventive care every 6 months. Brush your teeth twice a day and floss once a day. Good oral hygiene prevents tooth decay and gum disease.  . The frequency of eye exams is based on your age, health, family medical history, use  of contact lenses, and other factors. Follow your health care provider's ecommendations for frequency of eye exams.  . Eat a healthy diet. Foods like vegetables, fruits, whole grains, low-fat dairy products, and lean protein foods contain the nutrients you need without too many calories. Decrease your intake of foods high in solid fats, added sugars, and salt. Eat the right amount of calories for you. Get information about a proper diet from your health care provider, if necessary.  . Regular physical exercise is one of the most important things you can do for your health. Most adults should get at least 150 minutes  of moderate-intensity exercise (any activity that increases your heart rate and causes you to sweat) each week. In addition, most adults need muscle-strengthening exercises on 2 or more days a week.  Silver Sneakers may be a benefit available to you. To determine eligibility, you may visit the website: www.silversneakers.com or contact program at 405-327-1560 Mon-Fri between 8AM-8PM.   . Maintain a healthy weight. The body mass index (BMI) is a screening tool to identify possible weight problems. It provides an estimate of body fat based on height and weight. Your health care provider can find your BMI and can help you achieve or maintain a healthy weight.   For adults 20 years and older: ? A BMI below 18.5 is considered underweight. ? A BMI of 18.5 to 24.9 is normal. ? A BMI of 25 to 29.9 is considered overweight. ? A BMI of 30 and above is considered obese.   . Maintain normal blood lipids and cholesterol levels by exercising and minimizing your intake of saturated fat. Eat a balanced diet with plenty of fruit and vegetables. Blood tests for lipids and cholesterol should begin at age 9 and be repeated every 5 years. If your lipid or cholesterol levels are high, you are over 50, or you are at high risk for heart disease, you may need your cholesterol levels checked more frequently. Ongoing high lipid and cholesterol levels should be treated with  medicines if diet and exercise are not working.  . If you smoke, find out from your health care provider how to quit. If you do not use tobacco, please do not start.  . If you choose to drink alcohol, please do not consume more than 2 drinks per day. One drink is considered to be 12 ounces (355 mL) of beer, 5 ounces (148 mL) of wine, or 1.5 ounces (44 mL) of liquor.  . If you are 35-79 years old, ask your health care provider if you should take aspirin to prevent strokes.  . Use sunscreen. Apply sunscreen liberally and repeatedly throughout the day.  You should seek shade when your shadow is shorter than you. Protect yourself by wearing long sleeves, pants, a wide-brimmed hat, and sunglasses year round, whenever you are outdoors.  . Once a month, do a whole body skin exam, using a mirror to look at the skin on your back. Tell your health care provider of new moles, moles that have irregular borders, moles that are larger than a pencil eraser, or moles that have changed in shape or color.

## 2016-08-29 NOTE — Progress Notes (Signed)
Subjective:   [name] is a 82 y.o. male who presents for Medicare Annual/Subsequent preventive examination.  Review of Systems:  N/A Cardiac Risk Factors include: advanced age (>17men, >87 women);male gender;dyslipidemia;hypertension     Objective:    Vitals: BP 122/62 (BP Location: Left Arm, Patient Position: Sitting, Cuff Size: Normal)   Pulse 61   Temp 97.9 F (36.6 C) (Oral)   Ht 5\' 2"  (1.575 m) Comment: no shoes  Wt 151 lb 4 oz (68.6 kg)   SpO2 97%   BMI 27.66 kg/m   Body mass index is 27.66 kg/m.  Tobacco History  Smoking Status  . Never Smoker  Smokeless Tobacco  . Never Used     Counseling given: No   Past Medical History:  Diagnosis Date  . Cancer [facility])    prostate  . Carotid artery stenosis   . Degenerative disc disease   . Fractures    compression  . History of prostate cancer   . Hyperlipidemia   . Hypertension   . Kyphosis   . Non-cardiac chest pain 04/11   hospital ? [facility]   Past Surgical History:  Procedure Laterality Date  . CARDIOVASCULAR STRESS TEST  2004   cardiolite negative  . carotid doppler  11/05   0-39% bilatteral  . herniated disc surgery  1990   L3-4  . [place]   right  . prostate cancer surgery    . PROSTATE SURGERY    . TONSILLECTOMY     Family History  Problem Relation Age of Onset  . Hypertension Father    History  Sexual Activity  . Sexual activity: No    Outpatient Encounter Prescriptions as of 08/29/2016  Medication Sig  . aspirin EC 81 MG EC tablet Take 81 mg by mouth daily.    . Loratadine 10 MG CAPS Take by mouth as needed.  . trandolapril (MAVIK) 2 MG tablet Take 1 tablet (2 mg total) by mouth daily.  . vitamin B-12 (CYANOCOBALAMIN) 100 MCG tablet Take 100 mcg by mouth. 3 times a week  . VITAMIN D, CHOLECALCIFEROL, PO Take 1 tablet by mouth daily.    Marland Kitchen guaiFENesin (MUCINEX) 600 MG 12 hr tablet Take 1,200 mg by mouth as needed.  . Selenium (SELENIMIN-200 PO) Take by mouth  daily.    . simvastatin (ZOCOR) 20 MG tablet TAKE 1 TABLET BY MOUTH AT BEDTIME (Patient not taking: Reported on 08/29/2016)  . [DISCONTINUED] cyclobenzaprine (FLEXERIL) 10 MG tablet Take 1/2 tablet by mouth up to every 8 hours as needed for back pain   No facility-administered encounter medications on file as of 08/29/2016.     Activities of Daily Living In your present state of health, do you have any difficulty performing the following activities: 08/29/2016  Hearing? Y  Vision? N  Difficulty concentrating or making decisions? N  Walking or climbing stairs? N  Dressing or bathing? N  Doing errands, shopping? N  Preparing Food and eating ? N  Using the Toilet? N  In the past six months, have you accidently leaked urine? Y  Do you have problems with loss of bowel control? N  Managing your Medications? N  Managing your Finances? N  Housekeeping or managing your Housekeeping? N  Some recent data might be hidden    Patient Care Team: Abner Greenspan, MD as PCP - General Estill Cotta, MD as Consulting Physician (Ophthalmology) Almedia Balls, MD as Consulting Physician (Orthopedic Surgery) Aldona Lento, DDS as Consulting  Physician (Dentistry)   Assessment:     Hearing Screening   125Hz  250Hz  500Hz  1000Hz  2000Hz  3000Hz  4000Hz  6000Hz  8000Hz   Right ear:   40 0 0  0    Left ear:   40 0 0  0      Visual Acuity Screening   Right eye Left eye Both eyes  Without correction:     With correction: 20/40 20/40 20/40     Exercise Activities and Dietary recommendations Current Exercise Habits: Home exercise routine, Type of exercise: walking, Time (Minutes): 60, Frequency (Times/Week): 7, Weekly Exercise (Minutes/Week): 420, Intensity: Mild, Exercise limited by: None identified  Goals    . Increase physical activity          Starting 08/29/2016, I will continue to walk at least 60 min daily.       Fall Risk Fall Risk  08/29/2016 08/24/2015 08/14/2014 07/27/2013 06/14/2012  Falls  in the past year? Yes No No No -  Number falls in past yr: 2 or more - - - -  Injury with Fall? No - - - -  Risk Factor Category  High Fall Risk - - - -  Risk for fall due to : - - - - Impaired balance/gait;Other (Comment)  Risk for fall due to (comments): - - - - leg pain - uses a cane  Follow up Falls prevention discussed - - - -   Depression Screen PHQ 2/9 Scores 08/29/2016 08/24/2015 08/14/2014 07/27/2013  PHQ - 2 Score 0 0 0 0    Cognitive Function MMSE - Mini Mental State Exam 08/29/2016  Orientation to time 5  Orientation to Place 5  Registration 3  Attention/ Calculation 0  Recall 3  Language- name 2 objects 0  Language- repeat 1  Language- follow 3 step command 3  Language- read & follow direction 0  Write a sentence 0  Copy design 0  Total score 20     PLEASE NOTE: A Mini-Cog screen was completed. Maximum score is 20. A value of 0 denotes this part of [place] MMSE was not completed or the patient failed this part of the Mini-Cog screening.   Mini-Cog Screening Orientation to Time - Max 5 pts Orientation to Place - Max 5 pts Registration - Max 3 pts Recall - Max 3 pts Language Repeat - Max 1 pts Language Follow 3 Step Command - Max 3 pts     Immunization History  Administered Date(s) Administered  . Influenza Split 06/05/2011  . Influenza Whole 05/18/2012  . Influenza,inj,Quad PF,36+ Mos 06/03/2016  . Influenza-Unspecified 05/25/2013, 05/30/2014  . Pneumococcal Conjugate-13 08/14/2014  . Pneumococcal Polysaccharide-23 11/08/2002  . Td 09/08/2000, 06/14/2012  . Zoster 07/08/2005   Screening Tests Health Maintenance  Topic Date Due  . TETANUS/TDAP  06/14/2022  . INFLUENZA VACCINE  Addressed  . ZOSTAVAX  Completed  . PNA vac Low Risk Adult  Completed      Plan:     I have personally reviewed and addressed the Medicare Annual Wellness questionnaire and have noted the following in the patient's chart:  A. Medical and social history B. Use of  alcohol, tobacco or illicit drugs  C. Current medications and supplements D. Functional ability and status E.  Nutritional status F.  Physical activity G. Advance directives H. List of other physicians I.  Hospitalizations, surgeries, and ER visits in previous 12 months J.  [place] to include hearing, vision, cognitive, depression L. Referrals and appointments - none  In addition, I have  reviewed and discussed with patient certain preventive protocols, quality metrics, and best practice recommendations. A written personalized care plan for preventive services as well as general preventive health recommendations were provided to patient.  See attached scanned questionnaire for additional information.   Signed,   Lindell Noe, MHA, BS, LPN Health Coach

## 2016-09-05 ENCOUNTER — Ambulatory Visit (INDEPENDENT_AMBULATORY_CARE_PROVIDER_SITE_OTHER): Payer: Medicare Other | Admitting: Family Medicine

## 2016-09-05 ENCOUNTER — Encounter: Payer: Self-pay | Admitting: Family Medicine

## 2016-09-05 VITALS — BP 110/58 | HR 52 | Temp 97.4°F | Ht 62.0 in | Wt 151.0 lb

## 2016-09-05 DIAGNOSIS — M8588 Other specified disorders of bone density and structure, other site: Secondary | ICD-10-CM | POA: Diagnosis not present

## 2016-09-05 DIAGNOSIS — Z0000 Encounter for general adult medical examination without abnormal findings: Secondary | ICD-10-CM

## 2016-09-05 DIAGNOSIS — E559 Vitamin D deficiency, unspecified: Secondary | ICD-10-CM

## 2016-09-05 DIAGNOSIS — E7800 Pure hypercholesterolemia, unspecified: Secondary | ICD-10-CM

## 2016-09-05 DIAGNOSIS — I10 Essential (primary) hypertension: Secondary | ICD-10-CM

## 2016-09-05 DIAGNOSIS — R7989 Other specified abnormal findings of blood chemistry: Secondary | ICD-10-CM

## 2016-09-05 DIAGNOSIS — Z8546 Personal history of malignant neoplasm of prostate: Secondary | ICD-10-CM

## 2016-09-05 DIAGNOSIS — I6529 Occlusion and stenosis of unspecified carotid artery: Secondary | ICD-10-CM | POA: Diagnosis not present

## 2016-09-05 DIAGNOSIS — E538 Deficiency of other specified B group vitamins: Secondary | ICD-10-CM

## 2016-09-05 DIAGNOSIS — R946 Abnormal results of thyroid function studies: Secondary | ICD-10-CM

## 2016-09-05 MED ORDER — TRANDOLAPRIL 2 MG PO TABS: 2.0000 mg | ORAL_TABLET | Freq: Every day | ORAL | 3 refills | Status: DC

## 2016-09-05 NOTE — Patient Instructions (Addendum)
Let us know when you are ready to consider a hearing aide  Keep your walker next to the bed for night time trips to the bathroom and any time you feel unsteady (especailly when getting up)  Because of your bone loss - you are at risk for spinal fractures-even with a mild fall- so be very careful not to fall  Continue your vitamin D Stay off the simvastatin   You do not need extra supplements   Be cautious with alcohol- we get more sensitive with age and it can cause falls   Do increase water intake  If you want a refer to a foot doctor for toe nail care please let us know

## 2016-09-05 NOTE — Progress Notes (Signed)
Subjective:    Patient ID: [name], male    DOB: 1920/08/13, 82 y.o.   MRN: [id number]  HPI Here for health maintenance exam and to review chronic medical problems    Feeling pretty good overall  Had AMW visit on 12/22  No gaps identified Did fail hearing exam bilaterally -he is aware of hearing loss occ misunderstands his wife  Does not want to pursue a hearing aide  Has had falls w/o injury  (no fractures)   He notes after a period of inactivity he is " a little wobbly" when he gets up  Especially at night  Will start using light at night  Has a walker- does not use it all the time  Brought his healthcare POA today with him   Wt Readings from Last 3 Encounters:  09/05/16 151 lb (68.5 kg)  08/29/16 151 lb 4 oz (68.6 kg)  04/14/16 153 lb (69.4 kg)  overall stable Eats a healthy  Also walks for exercise daily (in the house)  bmi is 27.6  utd on imms   Hx of osteopenia Has had spinal compression fractures in the past Has kyphosis Vit D level is 30.1 dexa 4/01 He declines further testing or treatment Still takes daily vit D No fractures in the past year   Colon screen 11/14 IFOB Colonoscopy 04 Has out aged colon and prostate cancer screening   Lab Results  Component Value Date   PSA 0.40 08/21/2015   PSA 0.43 08/14/2014   PSA 0.44 07/21/2013   hx of prostate cancer with surgery in the past    bp is stable today (very well controlled)  No cp or palpitations or headaches or edema  No side effects to medicines  BP Readings from Last 3 Encounters:  09/05/16 (!) 110/58  08/29/16 122/62  04/14/16 122/60    Hx of carotid stenosis  Stable plaque seen on doppler  Will do yearly Takes aspirin daily   Hx of hyperlipidemia  Lab Results  Component Value Date   CHOL 158 08/29/2016   CHOL 167 08/21/2015   CHOL 163 08/14/2014   Lab Results  Component Value Date   HDL 41.10 08/29/2016   HDL 43.20 08/21/2015   HDL 46.10 08/14/2014   Lab Results    Component Value Date   LDLCALC 93 08/29/2016   LDLCALC 106 (H) 08/21/2015   LDLCALC 98 08/14/2014   Lab Results  Component Value Date   TRIG 120.0 08/29/2016   TRIG 90.0 08/21/2015   TRIG 94.0 08/14/2014   Lab Results  Component Value Date   CHOLHDL 4 08/29/2016   CHOLHDL 4 08/21/2015   CHOLHDL 4 08/14/2014   No results found for: LDLDIRECT  He does not take the simvastatin every day - in fact pretty much stopped it  Stable w/o the simvastatin    Hx of mild anemia Lab Results  Component Value Date   WBC 9.6 08/29/2016   HGB 12.6 (L) 08/29/2016   HCT 36.5 (L) 08/29/2016   MCV 96.3 08/29/2016   PLT 324.0 08/29/2016   he does not donate blood  He does drink alcohol- a drink per day  Hx of B12 def Lab Results  Component Value Date   VITAMINB12 1,166 (H) 08/29/2016    Hx of elevated tsh Lab Results  Component Value Date   TSH 4.70 (H) 08/29/2016   this is improved from 5.69 Nl T4 in the past  Review of Systems   Patient Active Problem List  Diagnosis Date Noted  . Vitamin D deficiency 08/28/2016  . Routine general medical examination at a health care facility 08/24/2015  . Injury of right hand 01/12/2015  . B12 deficiency 08/14/2014  . Low back pain 05/02/2014  . Urinary frequency 05/02/2014  . Encounter for Medicare annual wellness exam 07/27/2013  . Motion sickness 04/04/2013  . Osteopenia 06/14/2012  . Elevated TSH 06/14/2012  . MACROCYTIC ANEMIA 07/29/2010  . 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 FECAL OCCULT BLOOD 02/01/2010  . Essential hypertension 02/23/2009  . Carotid artery stenosis 02/23/2009  . SPONDYLOSIS, LUMBAR 02/23/2009  . NECK PAIN, CHRONIC 02/23/2009  . BACK PAIN, CHRONIC 02/23/2009  . COMPRESSION FRACTURE 02/23/2009  . HYPERCHOLESTEROLEMIA 02/21/2009  . History of prostate cancer 02/21/2009   Past Medical History:  Diagnosis Date  . Cancer [facility])    prostate  . Carotid artery stenosis   . Degenerative disc disease   . Fractures     compression  . History of prostate cancer   . Hyperlipidemia   . Hypertension   . Kyphosis   . Non-cardiac chest pain 04/11   hospital ? [facility]   Past Surgical History:  Procedure Laterality Date  . CARDIOVASCULAR STRESS TEST  2004   cardiolite negative  . carotid doppler  11/05   0-39% bilatteral  . herniated disc surgery  1990   L3-4  . [facility]   right  . prostate cancer surgery    . PROSTATE SURGERY    . TONSILLECTOMY     Social History  Substance Use Topics  . Smoking status: Never Smoker  . Smokeless tobacco: Never Used  . Alcohol use 0.0 oz/week     Comment: 2 drinks per day   Family History  Problem Relation Age of Onset  . Hypertension Father    Allergies  Allergen Reactions  . Simvastatin     Aches and pains   Current Outpatient Prescriptions on File Prior to Visit  Medication Sig Dispense Refill  . aspirin EC 81 MG EC tablet Take 81 mg by mouth daily.      Marland Kitchen guaiFENesin (MUCINEX) 600 MG 12 hr tablet Take 1,200 mg by mouth as needed.    . Loratadine 10 MG CAPS Take by mouth as needed.    . Selenium (SELENIMIN-200 PO) Take by mouth daily.      . vitamin B-12 (CYANOCOBALAMIN) 100 MCG tablet Take 100 mcg by mouth. 3 times a week    . VITAMIN D, CHOLECALCIFEROL, PO Take 1 tablet by mouth daily.       No current facility-administered medications on file prior to visit.         Objective:   Physical Exam  Constitutional: He appears well-developed and well-nourished. No distress.  Frail appearing elderly male  HENT:  Head: Normocephalic and atraumatic.  Right Ear: External ear normal.  Left Ear: External ear normal.  Nose: Nose normal.  Mouth/Throat: Oropharynx is clear and moist.  Eyes: Conjunctivae and EOM are normal. Pupils are equal, round, and reactive to light. Right eye exhibits no discharge. Left eye exhibits no discharge. No scleral icterus.  Neck: Normal range of motion. Neck supple. No JVD present. Carotid bruit is not  present. No thyromegaly present.  Cardiovascular: Normal rate, regular rhythm, normal heart sounds and intact distal pulses.  Exam reveals no gallop.   Pulmonary/Chest: Effort normal and breath sounds normal. No respiratory distress. He has no wheezes. He exhibits no tenderness.  Abdominal: Soft. Bowel sounds are normal. He  exhibits no distension, no abdominal bruit and no mass. There is no tenderness.  Musculoskeletal: He exhibits no edema or tenderness.  Stable kyphosis  Lymphadenopathy:    He has no cervical adenopathy.  Neurological: He is alert. He has normal reflexes. No cranial nerve deficit. He exhibits normal muscle tone. Coordination normal.  Skin: Skin is warm and dry. No rash noted. No erythema. No pallor.  Thickened overgrown toe nails  Solar lentigines and sks diffusely  Psychiatric: He has a normal mood and affect.          Assessment & Plan:   Problem List Items Addressed This Visit      Cardiovascular and Mediastinum   Essential hypertension - Primary    bp in fair control at this time  BP Readings from Last 1 Encounters:  09/05/16 (!) 110/58   No changes needed Disc lifstyle change with low sodium diet and exercise  Labs reviewed       Relevant Medications   trandolapril (MAVIK) 2 MG tablet   Carotid artery stenosis    Rev last doppler  0-39% stenosis bilat-stable Plan to check in a year  Bp/chol controlled On asa daily      Relevant Medications   trandolapril (MAVIK) 2 MG tablet     Musculoskeletal and Integument   Osteopenia    With hx of spinal comp fx and notable kyphosis  He declines further testing or treatment D level is therapeutic  Walking for exercise  Has fallen/no inj or fx         Other   Vitamin D deficiency    Vitamin D level is therapeutic with current supplementation Disc importance of this to bone and overall health       Routine general medical examination at a health care facility    Reviewed health habits  including diet and exercise and skin cancer prevention Reviewed appropriate screening tests for age  Also reviewed health mt list, fam hx and immunization status , as well as social and family history    AMW reviewed  He does not want to treat hearing loss Counseled on fall prev- needs to use walker at night and prn  Declines tx of osteopenia  Will f/u with urology as planned Continue current medicines  D and B12 levels are therapeutic        HYPERCHOLESTEROLEMIA    Disc goals for lipids and reasons to control them Rev labs with pt Rev low sat fat diet in detail  Despite stopping simvastatin due to intolerance- his numbers look ok/stable        Relevant Medications   trandolapril ([place]) 2 MG tablet   History of prostate cancer    S/p prostatectomy  He sees urology  No change in incontinence symptoms Also has nocturia        Elevated TSH    Mild and asymptomatic with nl T4 in the past Improved Lab Results  Component Value Date   TSH 4.70 (H) 08/29/2016    Continue to follow       B12 deficiency    Lab Results  Component Value Date   VITAMINB12 1,166 (H) 08/29/2016   curently takes 100 mcg three times per week

## 2016-09-05 NOTE — Progress Notes (Signed)
Pre visit review using our clinic review tool, if applicable. No additional management support is needed unless otherwise documented below in the visit note. 

## 2016-09-06 NOTE — Assessment & Plan Note (Signed)
Rev last doppler  0-39% stenosis bilat-stable Plan to check in a year  Bp/chol controlled On asa daily

## 2016-09-06 NOTE — Assessment & Plan Note (Signed)
Reviewed health habits including diet and exercise and skin cancer prevention Reviewed appropriate screening tests for age  Also reviewed health mt list, fam hx and immunization status , as well as social and family history    AMW reviewed  He does not want to treat hearing loss Counseled on fall prev- needs to use walker at night and prn  Declines tx of osteopenia  Will f/u with urology as planned Continue current medicines  D and B12 levels are therapeutic

## 2016-09-06 NOTE — Assessment & Plan Note (Signed)
Vitamin D level is therapeutic with current supplementation Disc importance of this to bone and overall health  

## 2016-09-06 NOTE — Assessment & Plan Note (Signed)
bp in fair control at this time  BP Readings from Last 1 Encounters:  09/05/16 (!) 110/58   No changes needed Disc lifstyle change with low sodium diet and exercise  Labs reviewed

## 2016-09-06 NOTE — Assessment & Plan Note (Signed)
With hx of spinal comp fx and notable kyphosis  He declines further testing or treatment D level is therapeutic  Walking for exercise  Has fallen/no inj or fx

## 2016-09-06 NOTE — Assessment & Plan Note (Signed)
Disc goals for lipids and reasons to control them Rev labs with pt Rev low sat fat diet in detail  Despite stopping simvastatin due to intolerance- his numbers look ok/stable

## 2016-09-06 NOTE — Assessment & Plan Note (Signed)
Lab Results  Component Value Date   VITAMINB12 1,166 (H) 08/29/2016   curently takes 100 mcg three times per week

## 2016-09-06 NOTE — Assessment & Plan Note (Signed)
S/p prostatectomy  He sees urology  No change in incontinence symptoms Also has nocturia

## 2016-09-06 NOTE — Assessment & Plan Note (Signed)
Mild and asymptomatic with nl T4 in the past Improved Lab Results  Component Value Date   TSH 4.70 (H) 08/29/2016    Continue to follow

## 2016-12-30 ENCOUNTER — Telehealth: Payer: Self-pay | Admitting: Family Medicine

## 2016-12-30 NOTE — Telephone Encounter (Signed)
[employer] [facility]  Patient Name: STONY STEGMANN  DOB: 03-15-1920    Initial Comment Caller states he had a tick bite that the tick was imbedded. He is now concerned about Lyme disease. Has a hx of being bitten a few years ago.    Nurse Assessment  Nurse: Harlow Mares, RN, Suanne Marker Date/Time (Eastern Time): 12/30/2016 5:12:35 PM  Confirm and document reason for call. If symptomatic, describe symptoms. ---Caller states he had a tick bite that the tick was imbedded. He is now concerned about Lyme disease. Has a hx of being bitten a few years ago. Reports that he was bitten on the ankle, tick was removed. There is a slight discoloration; speckled like rash.  Does the patient have any new or worsening symptoms? ---Yes  Will a triage be completed? ---Yes  Related visit to physician within the last 2 weeks? ---No  Does the PT have any chronic conditions? (i.e. diabetes, asthma, etc.) ---Unknown  Is this a behavioral health or substance abuse call? ---No     Guidelines    Guideline Title Affirmed Question Affirmed Notes  Tick Bite Tick bite with no complications    Final Disposition User   See Physician within 24 Hours [place], RN, Suanne Marker    Comments  Scheduled appt with Alma Friendly, FNP for tomorrow at 9:15 am at the [facility] location. Caller voiced understanding.  Caller reports that he hasn't had a tetanus shot in over 10 yrs, advised that he should be seen in 24 hours for this.   Referrals  REFERRED TO PCP OFFICE   Disagree/Comply: Comply

## 2016-12-31 ENCOUNTER — Ambulatory Visit (INDEPENDENT_AMBULATORY_CARE_PROVIDER_SITE_OTHER): Payer: Medicare HMO | Admitting: Primary Care

## 2016-12-31 ENCOUNTER — Encounter: Payer: Self-pay | Admitting: Primary Care

## 2016-12-31 VITALS — BP 132/70 | HR 58 | Temp 97.8°F | Ht 62.0 in | Wt 147.4 lb

## 2016-12-31 DIAGNOSIS — W57XXXA Bitten or stung by nonvenomous insect and other nonvenomous arthropods, initial encounter: Secondary | ICD-10-CM | POA: Diagnosis not present

## 2016-12-31 DIAGNOSIS — R21 Rash and other nonspecific skin eruption: Secondary | ICD-10-CM | POA: Diagnosis not present

## 2016-12-31 NOTE — Patient Instructions (Signed)
Your tick bite does not show evidence of either Lyme Disease or [facility] Spotted Fever.  Please call me if you develop fevers, fatigue, weakness, body aches, a "bulls eye" rash.  You may try applying cortisone cream twice daily for itching.   It was a pleasure to see you!   Tick Bite Information Introduction Ticks are insects that attach themselves to the skin. There are many types of ticks. Common types include wood ticks and deer ticks. Sometimes, ticks carry diseases that can make a person very ill. The most common places for ticks to attach themselves are the scalp, neck, armpits, waist, and groin. HOW CAN YOU PREVENT TICK BITES? Take these steps to help prevent tick bites when you are outdoors:  Wear long sleeves and long pants.  Wear white clothes so you can see ticks more easily.  Tuck your pant legs into your socks.  If walking on a trail, stay in the middle of the trail to avoid brushing against bushes.  Avoid walking through areas with long grass.  Put bug spray on all skin that is showing and along boot tops, pant legs, and sleeve cuffs.  Check clothes, hair, and skin often and before going inside.  Brush off any ticks that are not attached.  Take a shower or bath as soon as possible after being outdoors. HOW SHOULD YOU REMOVE A TICK? Ticks should be removed as soon as possible to help prevent diseases. 1. If latex gloves are available, put them on before trying to remove a tick. 2. Use tweezers to grasp the tick as close to the skin as possible. You may also use curved forceps or a tick removal tool. Grasp the tick as close to its head as possible. Avoid grasping the tick on its body. 3. Pull gently upward until the tick lets go. Do not twist the tick or jerk it suddenly. This may break off the tick's head or mouth parts. 4. Do not squeeze or crush the tick's body. This could force disease-carrying fluids from the tick into your body. 5. After the tick is  removed, wash the bite area and your hands with soap and water or alcohol. 6. Apply a small amount of antiseptic cream or ointment to the bite site. 7. Wash any tools that were used. Do not try to remove a tick by applying a hot match, petroleum jelly, or fingernail polish to the tick. These methods do not work. They may also increase the chances of disease being spread from the tick bite. WHEN SHOULD YOU SEEK HELP? Contact your health care provider if you are unable to remove a tick or if a part of the tick breaks off in the skin. After a tick bite, you need to watch for signs and symptoms of diseases that can be spread by ticks. Contact your health care provider if you develop any of the following:  Fever.  Rash.  Redness and puffiness (swelling) in the area of the tick bite.  Tender, puffy lymph glands.  Watery poop (diarrhea).  Weight loss.  Cough.  Feeling more tired than normal (fatigue).  Muscle, joint, or bone pain.  Belly (abdominal) pain.  Headache.  Change in your level of consciousness.  Trouble walking or moving your legs.  Loss of feeling (numbness) in the legs.  Loss of movement (paralysis).  Shortness of breath.  Confusion.  Throwing up (vomiting) many times. This information is not intended to replace advice given to you by your health care provider.  Make sure you discuss any questions you have with your health care provider. Document Released: 11/19/2009 Document Revised: 01/31/2016 Document Reviewed: 02/02/2013 Elsevier Interactive Patient Education  2017 [employer].

## 2016-12-31 NOTE — Progress Notes (Signed)
Subjective:    Patient ID: [name], male    DOB: January 04, 1920, 83 y.o.   MRN: [id number]  HPI  [name] is a 83 year old male who presents today with a chief complaint of tick bite. He noticed a tick attacked to his right medial ankle 2-3 days ago. His wife pulled off the tick and was able to remove the entire tick. He's noticed itching and mild rash. He denies fevers, weakness, fatigue, body aches. He is nervous about contracting Lyme Disease as his neighbor was recently diagnosed.  Review of Systems  Constitutional: Negative for chills, fatigue and fever.  Musculoskeletal: Negative for myalgias.  Skin: Positive for rash.  Neurological: Negative for weakness.       Past Medical History:  Diagnosis Date  . Cancer [facility])    prostate  . Carotid artery stenosis   . Degenerative disc disease   . Fractures    compression  . History of prostate cancer   . Hyperlipidemia   . Hypertension   . Kyphosis   . Non-cardiac chest pain 04/11   hospital ? [facility]     Social History   Social History  . Marital status: Married    Spouse name: N/A  . Number of children: N/A  . Years of education: N/A   Occupational History  . 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 Smoking status: Never Smoker  . Smokeless tobacco: Never Used  . Alcohol use 0.0 oz/week     Comment: 2 drinks per day  . Drug use: No  . Sexual activity: No   Other Topics Concern  . 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 CARDIOVASCULAR STRESS TEST  2004   cardiolite negative  . carotid doppler  11/05   0-39% bilatteral  . herniated disc surgery  1990   L3-4  . [place] [facility]   right  . prostate cancer surgery    . PROSTATE SURGERY    . TONSILLECTOMY      Family History  Problem Relation Age of Onset  . Hypertension Father      Allergies  Allergen Reactions  . Simvastatin     Aches and pains    Current Outpatient Prescriptions on File Prior to Visit  Medication Sig Dispense Refill  . aspirin EC 81 MG EC tablet Take 81 mg by mouth daily.      Marland Kitchen guaiFENesin (MUCINEX) 600 MG 12 hr tablet Take 1,200 mg by mouth as needed.    . Loratadine 10 MG CAPS Take by mouth as needed.    . trandolapril (MAVIK) 2 MG tablet Take 1 tablet (2 mg total) by mouth daily. 90 tablet 3  . vitamin B-12 (CYANOCOBALAMIN) 100 MCG tablet Take 100 mcg by mouth. 3 times a week    . VITAMIN D, CHOLECALCIFEROL, PO Take 1 tablet by mouth daily.       No current facility-administered medications on file prior to visit.     BP 132/70   Pulse (!) 58   Temp 97.8 F (36.6 C) (Oral)   Ht 5\' 2"  (1.575 m)   Wt 147 lb 6.4 oz (66.9 kg)   SpO2 (!) 70%   BMI 26.96 kg/m    Objective:   Physical Exam  Constitutional:  He appears well-nourished.  Neck: Neck supple.  Cardiovascular: Normal rate and regular rhythm.   Pulmonary/Chest: Effort normal and breath sounds normal.  Skin: Skin is warm and dry.  Mild rash around site of tick bite to right medial ankle. No erythema migrans rash noted. Non tender.          Assessment & Plan:  Tick Bite:  Located to right medial ankle that he noticed 2-3 days ago. Exam today without evidence of Lyme Disease or RMSF. He has no systemic signs of either Tick borne illness. Appears to be healing well. Reassurance provided. Discussed use of OTC cortisone cream, cool compresses.  Discussed warning signs of both disease.  Sheral Flow, NP

## 2016-12-31 NOTE — Progress Notes (Signed)
Pre visit review using our clinic review tool, if applicable. No additional management support is needed unless otherwise documented below in the visit note. 

## 2016-12-31 NOTE — Telephone Encounter (Signed)
Pt has appt with [name] NP 12/31/16 at 9:30

## 2016-12-31 NOTE — Telephone Encounter (Signed)
Noted and evaluated. No concerns for Lyme Disease.

## 2017-04-21 DIAGNOSIS — M13862 Other specified arthritis, left knee: Secondary | ICD-10-CM | POA: Diagnosis not present

## 2017-04-21 DIAGNOSIS — Z7982 Long term (current) use of aspirin: Secondary | ICD-10-CM | POA: Diagnosis not present

## 2017-04-21 DIAGNOSIS — Z9181 History of falling: Secondary | ICD-10-CM | POA: Diagnosis not present

## 2017-04-21 DIAGNOSIS — M13861 Other specified arthritis, right knee: Secondary | ICD-10-CM | POA: Diagnosis not present

## 2017-04-21 DIAGNOSIS — Z79899 Other long term (current) drug therapy: Secondary | ICD-10-CM | POA: Diagnosis not present

## 2017-04-21 DIAGNOSIS — E785 Hyperlipidemia, unspecified: Secondary | ICD-10-CM | POA: Diagnosis not present

## 2017-04-21 DIAGNOSIS — Z7289 Other problems related to lifestyle: Secondary | ICD-10-CM | POA: Diagnosis not present

## 2017-04-21 DIAGNOSIS — I10 Essential (primary) hypertension: Secondary | ICD-10-CM | POA: Diagnosis not present

## 2017-04-21 DIAGNOSIS — Z6828 Body mass index (BMI) 28.0-28.9, adult: Secondary | ICD-10-CM | POA: Diagnosis not present

## 2017-04-21 DIAGNOSIS — Z0000 Encounter for general adult medical examination without abnormal findings: Secondary | ICD-10-CM | POA: Diagnosis not present

## 2017-04-21 DIAGNOSIS — E663 Overweight: Secondary | ICD-10-CM | POA: Diagnosis not present

## 2017-05-01 ENCOUNTER — Encounter: Payer: Self-pay | Admitting: Internal Medicine

## 2017-05-01 ENCOUNTER — Ambulatory Visit (INDEPENDENT_AMBULATORY_CARE_PROVIDER_SITE_OTHER): Payer: Medicare HMO | Admitting: Internal Medicine

## 2017-05-01 ENCOUNTER — Ambulatory Visit: Payer: Medicare HMO | Admitting: Family Medicine

## 2017-05-01 VITALS — BP 120/66 | HR 63 | Temp 98.3°F | Wt 150.0 lb

## 2017-05-01 DIAGNOSIS — S51811A Laceration without foreign body of right forearm, initial encounter: Secondary | ICD-10-CM

## 2017-05-01 NOTE — Progress Notes (Signed)
Subjective:    Patient ID: [name], male    DOB: 1920/06/25, 83 y.o.   MRN: [id number]  HPI  Pt presents to the clinic today with c/o an abrasion to his right forearm. He reports he started to trip, and he caught himself against a brick wall. He sustained a skin tear. He cleansed the area with soap and water, put Neosporin on it, and covered it with a bandaid.   Review of Systems      Past Medical History:  Diagnosis Date  . Cancer [facility])    prostate  . Carotid artery stenosis   . Degenerative disc disease   . Fractures    compression  . History of prostate cancer   . Hyperlipidemia   . Hypertension   . Kyphosis   . Non-cardiac chest pain 04/11   hospital ? [facility]    Current Outpatient Prescriptions  Medication Sig Dispense Refill  . aspirin EC 81 MG EC tablet Take 81 mg by mouth daily.      Marland Kitchen guaiFENesin (MUCINEX) 600 MG 12 hr tablet Take 1,200 mg by mouth as needed.    . Loratadine 10 MG CAPS Take by mouth as needed.    . SELENIUM PO Take by mouth.    . trandolapril (MAVIK) 2 MG tablet Take 1 tablet (2 mg total) by mouth daily. 90 tablet 3  . vitamin B-12 (CYANOCOBALAMIN) 100 MCG tablet Take 100 mcg by mouth. 3 times a week    . VITAMIN D, CHOLECALCIFEROL, PO Take 1 tablet by mouth daily.       No current facility-administered medications for this visit.     Allergies  Allergen Reactions  . Simvastatin     Aches and pains    Family History  Problem Relation Age of Onset  . Hypertension Father     Social History   Social History  . Marital status: Married    Spouse name: N/A  . Number of children: N/A  . Years of education: N/A   Occupational History  . 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 Smoking status: Never Smoker  . Smokeless tobacco: Never Used  . Alcohol use 0.0 oz/week     Comment: 2 drinks per day  . Drug use: No  . Sexual activity: No   Other Topics Concern  . 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  Skin: Pt reports skin tear to right forearm . Denies redness, rashes, lesions or ulcercations.    No other specific complaints in a complete review of systems (except as listed in HPI above).  Objective:   Physical Exam   BP 120/66   Pulse 63   Temp 98.3 F (36.8 C) (Oral)   Wt 150 lb (68 kg)   SpO2 97%   BMI 27.44 kg/m  Wt Readings from Last 3 Encounters:  05/01/17 150 lb (68 kg)  12/31/16 147 lb 6.4 oz (66.9 kg)  09/05/16 151 lb (68.5 kg)    General: Appears his  stated age, in NAD. Skin: 1 cm V-shaped skin tear noted on right forearm. Area is oozing blood but no surrounding erythema.    BMET    Component Value Date/Time   NA 135 08/29/2016 1514   K 4.6 08/29/2016 1514  CL 103 08/29/2016 1514   CO2 26 08/29/2016 1514   GLUCOSE 95 08/29/2016 1514   BUN 28 (H) 08/29/2016 1514   CREATININE 1.11 08/29/2016 1514   CALCIUM 9.4 08/29/2016 1514   GFRNONAA 65.72 07/18/2010 0836   GFRAA  02/02/2010 0949    >60        The eGFR has been calculated using the MDRD equation. This calculation has not been validated in all clinical situations. eGFR's persistently <60 mL/min signify possible Chronic Kidney Disease.    Lipid Panel     Component Value Date/Time   CHOL 158 08/29/2016 1514   TRIG 120.0 08/29/2016 1514   HDL 41.10 08/29/2016 1514   CHOLHDL 4 08/29/2016 1514   VLDL 24.0 08/29/2016 1514   LDLCALC 93 08/29/2016 1514    CBC    Component Value Date/Time   WBC 9.6 08/29/2016 1514   RBC 3.79 (L) 08/29/2016 1514   HGB 12.6 (L) 08/29/2016 1514   HCT 36.5 (L) 08/29/2016 1514   PLT 324.0 08/29/2016 1514   MCV 96.3 08/29/2016 1514   MCHC 34.7 08/29/2016 1514   RDW 12.8 08/29/2016 1514   LYMPHSABS 1.0 08/29/2016 1514   MONOABS 1.1 (H) 08/29/2016 1514   EOSABS 0.2 08/29/2016 1514    BASOSABS 0.0 08/29/2016 1514    Hgb A1C No results found for: HGBA1C         Assessment & Plan:   Skin Tear of Right Forearm:  Skin was unfolded and placed over open area Covered with TAB, Tefla and Bandaid Wound care instructions provided  Return precautions discussed Webb Silversmith, NP

## 2017-05-01 NOTE — Patient Instructions (Signed)
Skin Tear Care A skin tear is a wound in which the top layer of skin peels off. To repair the skin, your doctor may use:  Tape.  Skin adhesive strips.  A bandage (dressing) may also be placed over the tape or skin adhesive strips. How to care for your skin tear  Clean the wound as told by your doctor. You may be told to keep the wound dry for the first few days. If you are told to clean the wound: ? Wash the wound with mild soap and water or a salt-water (saline) solution. ? Rinse the wound with water to remove all soap. ? Do not rub the wound dry. Let the wound air dry.  Change any dressings as told by your doctor. This includes changing the dressing if it gets wet, gets dirty, or starts to smell bad.  Do not scratch or pick at the wound.  Protect the injured area until it has healed.  Check your wound every day for signs of infection. Check for: ? More redness, swelling, or pain. ? More fluid or blood. ? Warmth. ? Pus or a bad smell. Medicines   Take over-the-counter and prescription medicines only as told by your doctor.  If you were prescribed an antibiotic medicine, take or apply it as told by your doctor. Do not stop using the antibiotic even if your condition gets better. General instructions  Keep the bandage dry as told by your doctor.  Do not take baths, swim, or do anything that puts your wound underwater until your doctor says it is okay.  Keep all follow-up visits as told by your doctor. This is important. Contact a doctor if:  You have more redness, swelling, or pain around your wound.  You have more fluid or blood coming from your wound.  Your wound feels warm to the touch.  You have pus or a bad smell coming from your wound. Get help right away if:  You have a red streak that goes away from the skin tear.  You have a fever and chills and your symptoms suddenly get worse. This information is not intended to replace advice given to you by your health  care provider. Make sure you discuss any questions you have with your health care provider. Document Released: 06/03/2008 Document Revised: 04/20/2016 Document Reviewed: 07/16/2015 Elsevier Interactive Patient Education  2018 Elsevier Inc.  

## 2017-06-13 DIAGNOSIS — R69 Illness, unspecified: Secondary | ICD-10-CM | POA: Diagnosis not present

## 2017-06-29 ENCOUNTER — Other Ambulatory Visit: Payer: Self-pay | Admitting: *Deleted

## 2017-06-29 DIAGNOSIS — I6523 Occlusion and stenosis of bilateral carotid arteries: Secondary | ICD-10-CM

## 2017-08-24 ENCOUNTER — Other Ambulatory Visit: Payer: Self-pay | Admitting: Family Medicine

## 2017-09-10 ENCOUNTER — Other Ambulatory Visit (INDEPENDENT_AMBULATORY_CARE_PROVIDER_SITE_OTHER): Payer: Medicare HMO

## 2017-09-10 ENCOUNTER — Telehealth (INDEPENDENT_AMBULATORY_CARE_PROVIDER_SITE_OTHER): Payer: Medicare HMO | Admitting: Family Medicine

## 2017-09-10 DIAGNOSIS — E538 Deficiency of other specified B group vitamins: Secondary | ICD-10-CM

## 2017-09-10 DIAGNOSIS — R7989 Other specified abnormal findings of blood chemistry: Secondary | ICD-10-CM | POA: Diagnosis not present

## 2017-09-10 DIAGNOSIS — E7800 Pure hypercholesterolemia, unspecified: Secondary | ICD-10-CM | POA: Diagnosis not present

## 2017-09-10 DIAGNOSIS — I10 Essential (primary) hypertension: Secondary | ICD-10-CM | POA: Diagnosis not present

## 2017-09-10 DIAGNOSIS — E559 Vitamin D deficiency, unspecified: Secondary | ICD-10-CM | POA: Diagnosis not present

## 2017-09-10 LAB — COMPREHENSIVE METABOLIC PANEL
ALT: 11 U/L (ref 0–53)
AST: 18 U/L (ref 0–37)
Albumin: 3.8 g/dL (ref 3.5–5.2)
Alkaline Phosphatase: 49 U/L (ref 39–117)
BUN: 28 mg/dL — ABNORMAL HIGH (ref 6–23)
CO2: 29 mEq/L (ref 19–32)
Calcium: 9.5 mg/dL (ref 8.4–10.5)
Chloride: 104 mEq/L (ref 96–112)
Creatinine, Ser: 1.17 mg/dL (ref 0.40–1.50)
GFR: 61.51 mL/min (ref >60.00)
Glucose, Bld: 96 mg/dL (ref 70–99)
Potassium: 4.3 mEq/L (ref 3.5–5.1)
Sodium: 137 mEq/L (ref 135–145)
Total Bilirubin: 0.5 mg/dL (ref 0.2–1.2)
Total Protein: 6.6 g/dL (ref 6.0–8.3)

## 2017-09-10 LAB — CBC WITH DIFFERENTIAL/PLATELET
Basophils Absolute: 0 10*3/uL (ref 0.0–0.1)
Basophils Relative: 0.4 % (ref 0.0–3.0)
Eosinophils Absolute: 0.2 10*3/uL (ref 0.0–0.7)
Eosinophils Relative: 1.9 % (ref 0.0–5.0)
HCT: 39.7 % (ref 39.0–52.0)
Hemoglobin: 13.2 g/dL (ref 13.0–17.0)
Lymphocytes Relative: 11 % — ABNORMAL LOW (ref 12.0–46.0)
Lymphs Abs: 1.1 10*3/uL (ref 0.7–4.0)
MCHC: 33.4 g/dL (ref 30.0–36.0)
MCV: 98.4 fl (ref 78.0–100.0)
Monocytes Absolute: 1 10*3/uL (ref 0.1–1.0)
Monocytes Relative: 10.4 % (ref 3.0–12.0)
Neutro Abs: 7.6 10*3/uL (ref 1.4–7.7)
Neutrophils Relative %: 76.3 % (ref 43.0–77.0)
Platelets: 338 10*3/uL (ref 150.0–400.0)
RBC: 4.03 Mil/uL — ABNORMAL LOW (ref 4.22–5.81)
RDW: 13.1 % (ref 11.5–15.5)
WBC: 10 10*3/uL (ref 4.0–10.5)

## 2017-09-10 LAB — LIPID PANEL
Cholesterol: 163 mg/dL (ref 0–200)
HDL: 37 mg/dL — ABNORMAL LOW (ref >39.00)
LDL Cholesterol: 99 mg/dL (ref 0–99)
NonHDL: 125.93
Total CHOL/HDL Ratio: 4
Triglycerides: 137 mg/dL (ref 0.0–149.0)
VLDL: 27.4 mg/dL (ref 0.0–40.0)

## 2017-09-10 LAB — TSH: TSH: 8.08 u[IU]/mL — ABNORMAL HIGH (ref 0.35–4.50)

## 2017-09-10 LAB — VITAMIN B12: Vitamin B-12: 1150 pg/mL — ABNORMAL HIGH (ref 211–911)

## 2017-09-10 LAB — VITAMIN D 25 HYDROXY (VIT D DEFICIENCY, FRACTURES): VITD: 37.33 ng/mL (ref 30.00–100.00)

## 2017-09-10 LAB — T4, FREE: Free T4: 0.6 ng/dL (ref 0.60–1.60)

## 2017-09-10 NOTE — Telephone Encounter (Signed)
-----   Message from Ellamae Sia sent at 09/10/2017  8:35 AM EST ----- Regarding: lab orders, asap Patient is scheduled for CPX labs, please order future labs, Thanks , Karna Christmas

## 2017-09-11 ENCOUNTER — Ambulatory Visit (INDEPENDENT_AMBULATORY_CARE_PROVIDER_SITE_OTHER): Payer: Medicare HMO | Admitting: Family Medicine

## 2017-09-11 ENCOUNTER — Telehealth: Payer: Self-pay

## 2017-09-11 ENCOUNTER — Encounter: Payer: Self-pay | Admitting: Family Medicine

## 2017-09-11 VITALS — BP 128/66 | HR 61 | Temp 97.7°F | Ht 63.0 in | Wt 150.2 lb

## 2017-09-11 DIAGNOSIS — E559 Vitamin D deficiency, unspecified: Secondary | ICD-10-CM

## 2017-09-11 DIAGNOSIS — I6529 Occlusion and stenosis of unspecified carotid artery: Secondary | ICD-10-CM

## 2017-09-11 DIAGNOSIS — W19XXXA Unspecified fall, initial encounter: Secondary | ICD-10-CM | POA: Insufficient documentation

## 2017-09-11 DIAGNOSIS — E538 Deficiency of other specified B group vitamins: Secondary | ICD-10-CM | POA: Diagnosis not present

## 2017-09-11 DIAGNOSIS — Y92009 Unspecified place in unspecified non-institutional (private) residence as the place of occurrence of the external cause: Secondary | ICD-10-CM | POA: Insufficient documentation

## 2017-09-11 DIAGNOSIS — E039 Hypothyroidism, unspecified: Secondary | ICD-10-CM

## 2017-09-11 DIAGNOSIS — R2689 Other abnormalities of gait and mobility: Secondary | ICD-10-CM | POA: Insufficient documentation

## 2017-09-11 DIAGNOSIS — E7800 Pure hypercholesterolemia, unspecified: Secondary | ICD-10-CM | POA: Diagnosis not present

## 2017-09-11 DIAGNOSIS — M8588 Other specified disorders of bone density and structure, other site: Secondary | ICD-10-CM | POA: Diagnosis not present

## 2017-09-11 DIAGNOSIS — W19XXXS Unspecified fall, sequela: Secondary | ICD-10-CM

## 2017-09-11 DIAGNOSIS — Z0000 Encounter for general adult medical examination without abnormal findings: Secondary | ICD-10-CM | POA: Diagnosis not present

## 2017-09-11 DIAGNOSIS — I10 Essential (primary) hypertension: Secondary | ICD-10-CM | POA: Diagnosis not present

## 2017-09-11 MED ORDER — LEVOTHYROXINE SODIUM 25 MCG PO TABS: 25.0000 ug | ORAL_TABLET | Freq: Every day | ORAL | 11 refills | Status: DC

## 2017-09-11 MED ORDER — TRANDOLAPRIL 2 MG PO TABS: 2.0000 mg | ORAL_TABLET | Freq: Every day | ORAL | 3 refills | Status: DC

## 2017-09-11 NOTE — Progress Notes (Signed)
Subjective:    Patient ID: [name], male    DOB: [birth year], 84 y.o.   MRN: [id number]  HPI  Here for health maintenance exam and to review chronic medical problems   In very good shape for his age in general  84 yo !   (family of long lived folks) Still quite independent   Lot of stress-son with Cherlyn Cushing and dementia in the house  Getting additional help  Family has a lot of support   Wt Readings from Last 3 Encounters:  09/11/17 150 lb 4 oz (68.2 kg)  05/01/17 150 lb (68 kg)  12/31/16 147 lb 6.4 oz (66.9 kg)  stable weight  Appetite is good  26.62 kg/m   Has not had amw visit yet   Tetanus shot 4/18 PNA vaccines utd Flu shot 10/18  zostavax 10/06   Out aged prostate ca screen (has had prostate cancer in the past)- with prostatectomy in the past    Has not seen urology lately- keeps incontinence problem  Also colon cancer   Hx of OP and compression fx Declines further eval or tx for this  D level is 37.33  dexa years ago   He has had more than one fall this year  3-4 "hard sit downs" -moreso than falls -usually when changing position/backing up or turning  Cannot use a walker well in the house  occ a cane   bp is stable today  No cp or palpitations or headaches or edema  No side effects to medicines  BP Readings from Last 3 Encounters:  09/11/17 128/66  05/01/17 120/66  12/31/16 132/70     H/o carotid artery stenosis  Ordered yearly [place]  Takes asa 81 mg daily w/o problems   H/o anemia -this visit cbc is nl  Lab Results  Component Value Date   WBC 10.0 09/10/2017   HGB 13.2 09/10/2017   HCT 39.7 09/10/2017   MCV 98.4 09/10/2017   PLT 338.0 09/10/2017   Lab Results  Component Value Date   CREATININE 1.17 09/10/2017   BUN 28 (H) 09/10/2017   NA 137 09/10/2017   K 4.3 09/10/2017   CL 104 09/10/2017   CO2 29 09/10/2017   Lab Results  Component Value Date   ALT 11 09/10/2017   AST 18 09/10/2017   ALKPHOS 49 09/10/2017   BILITOT 0.5  09/10/2017     Hyperlipidemia Lab Results  Component Value Date   CHOL 163 09/10/2017   CHOL 158 08/29/2016   CHOL 167 08/21/2015   Lab Results  Component Value Date   HDL 37.00 (L) 09/10/2017   HDL 41.10 08/29/2016   HDL 43.20 08/21/2015   Lab Results  Component Value Date   LDLCALC 99 09/10/2017   LDLCALC 93 08/29/2016   LDLCALC 106 (H) 08/21/2015   Lab Results  Component Value Date   TRIG 137.0 09/10/2017   TRIG 120.0 08/29/2016   TRIG 90.0 08/21/2015   Lab Results  Component Value Date   CHOLHDL 4 09/10/2017   CHOLHDL 4 08/29/2016   CHOLHDL 4 08/21/2015   No results found for: LDLDIRECT Used to take simvastatin - is ok off of it now  Tries to eat healthy (hard to get his wife to do that with him)   Elevated tsh Lab Results  Component Value Date   TSH 8.08 (H) 09/10/2017   freeT4 is 0.6- bottom of nl range  Suspect hypothyroid   Hx of B12 def Lab Results  Component  Value Date   VITAMINB12 1,150 (H) 09/10/2017   Takes 100 mcg three times per week   D level is 37.3  Patient Active Problem List   Diagnosis Date Noted  . Fall at home 09/11/2017  . Poor balance 09/11/2017  . Vitamin D deficiency 08/28/2016  . Routine general medical examination at a health care facility 08/24/2015  . B12 deficiency 08/14/2014  . Low back pain 05/02/2014  . Urinary frequency 05/02/2014  . Encounter for Medicare annual wellness exam 07/27/2013  . Motion sickness 04/04/2013  . Osteopenia 06/14/2012  . Elevated TSH 06/14/2012  . MACROCYTIC ANEMIA 07/29/2010  . 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 FECAL OCCULT BLOOD 02/01/2010  . Essential hypertension 02/23/2009  . Carotid artery stenosis 02/23/2009  . SPONDYLOSIS, LUMBAR 02/23/2009  . NECK PAIN, CHRONIC 02/23/2009  . BACK PAIN, CHRONIC 02/23/2009  . COMPRESSION FRACTURE 02/23/2009  . HYPERCHOLESTEROLEMIA 02/21/2009  . History of prostate cancer 02/21/2009   Past Medical History:  Diagnosis Date  . Cancer [facility])     prostate  . Carotid artery stenosis   . Degenerative disc disease   . Fractures    compression  . History of prostate cancer   . Hyperlipidemia   . Hypertension   . Kyphosis   . Non-cardiac chest pain 04/11   hospital ? [facility]   Past Surgical History:  Procedure Laterality Date  . CARDIOVASCULAR STRESS TEST  2004   cardiolite negative  . carotid doppler  11/05   0-39% bilatteral  . herniated disc surgery  1990   L3-4  . [place]   right  . prostate cancer surgery    . PROSTATE SURGERY    . TONSILLECTOMY     Social History   Tobacco Use  . Smoking status: Never Smoker  . Smokeless tobacco: Never Used  Substance Use Topics  . Alcohol use: Yes    Alcohol/week: 0.0 oz    Comment: 2 drinks per day  . Drug use: No   Family History  Problem Relation Age of Onset  . Hypertension Father    Allergies  Allergen Reactions  . Simvastatin     Aches and pains   Current Outpatient Medications on File Prior to Visit  Medication Sig Dispense Refill  . aspirin EC 81 MG EC tablet Take 81 mg by mouth daily.      Marland Kitchen guaiFENesin (MUCINEX) 600 MG 12 hr tablet Take 1,200 mg by mouth as needed.    . Loratadine 10 MG CAPS Take by mouth as needed.    . SELENIUM PO Take by mouth.    . vitamin B-12 (CYANOCOBALAMIN) 100 MCG tablet Take 100 mcg by mouth. 3 times a week    . VITAMIN D, CHOLECALCIFEROL, PO Take 1 tablet by mouth daily.       No current facility-administered medications on file prior to visit.     Review of Systems  Constitutional: Negative for activity change, appetite change, fatigue, fever and unexpected weight change.  HENT: Negative for congestion, rhinorrhea, sore throat and trouble swallowing.   Eyes: Negative for pain, redness, itching and visual disturbance.  Respiratory: Negative for cough, chest tightness, shortness of breath and wheezing.   Cardiovascular: Negative for chest pain and palpitations.  Gastrointestinal: Negative for abdominal pain,  blood in stool, constipation, diarrhea and nausea.  Endocrine: Negative for cold intolerance, heat intolerance, polydipsia and polyuria.  Genitourinary: Negative for difficulty urinating, dysuria, frequency and urgency.  Musculoskeletal: Positive for arthralgias and back pain. Negative  for joint swelling and myalgias.  Skin: Negative for pallor and rash.  Neurological: Negative for dizziness, tremors, weakness, numbness and headaches.       Pos for poor balance and occ falls   Hematological: Negative for adenopathy. Does not bruise/bleed easily.  Psychiatric/Behavioral: Negative for decreased concentration and dysphoric mood. The patient is not nervous/anxious.        Objective:   Physical Exam  Constitutional: He appears well-developed and well-nourished. No distress.  Well but frail appearing elderly male  HENT:  Head: Normocephalic and atraumatic.  Right Ear: External ear normal.  Left Ear: External ear normal.  Nose: Nose normal.  Mouth/Throat: Oropharynx is clear and moist.  Eyes: Conjunctivae and EOM are normal. Pupils are equal, round, and reactive to light. Right eye exhibits no discharge. Left eye exhibits no discharge. No scleral icterus.  Neck: Normal range of motion. Neck supple. No JVD present. Carotid bruit is not present. No thyromegaly present.  Cardiovascular: Normal rate, regular rhythm, normal heart sounds and intact distal pulses. Exam reveals no gallop.  Pulmonary/Chest: Effort normal and breath sounds normal. No respiratory distress. He has no wheezes. He exhibits no tenderness.  Abdominal: Soft. Bowel sounds are normal. He exhibits no distension, no abdominal bruit and no mass. There is no tenderness.  Musculoskeletal: He exhibits no edema or tenderness.  Significant kyphosis   Lymphadenopathy:    He has no cervical adenopathy.  Neurological: He is alert. He has normal reflexes. He displays no tremor. No cranial nerve deficit. He exhibits normal muscle tone. Gait  abnormal. Coordination normal.  No tremor  Gait is wide and somewhat shuffling Balance is fair  Needs assistance to get on table Does not use assistive device for walking   Skin: Skin is warm and dry. No rash noted. No erythema. No pallor.  Solar lentigines diffusely Some sks  Psychiatric: He has a normal mood and affect.          Assessment & Plan:   Problem List Items Addressed This Visit      Cardiovascular and Mediastinum   Carotid artery stenosis    Last carotid study showed minimal stenosis bilat  He desires cardiology visit with Dr Percival Spanish to discuss a plan for this  Continue asa  Disc goals for lipids and good health habits       Relevant Medications   trandolapril (MAVIK) 2 MG tablet   Other Relevant Orders   Ambulatory referral to Cardiology   Essential hypertension    bp in fair control at this time  BP Readings from Last 1 Encounters:  09/11/17 128/66   No changes needed Disc lifstyle change with low sodium diet and exercise  Labs reviewed       Relevant Medications   trandolapril (MAVIK) 2 MG tablet     Endocrine   Hypothyroid    Now with low FT4 Will begin levothyroxine 25 mcg daily -in am at least 30 min prior to food or meds  Plan lab for 6 wk Update if side effects  Lab Results  Component Value Date   TSH 8.08 (H) 09/10/2017         Relevant Medications   levothyroxine (SYNTHROID, LEVOTHROID) 25 MCG tablet     Musculoskeletal and Integument   Osteopenia    Declines further monitoring or tx (despite falls and fx hx) Stressed imp of ca and D  Also exercise safely as tolerated         Other   B12 deficiency  B12 level remains supra therapeutic with oral supplementation      Fall at home    Ref to PT for balance No injuries-but has OP      Relevant Orders   Ambulatory referral to Physical Therapy   HYPERCHOLESTEROLEMIA    Disc goals for lipids and reasons to control them Rev labs with pt Rev low sat fat diet in  detail Fair control without statin at this time (was formerly on zocor)       Relevant Medications   trandolapril (MAVIK) 2 MG tablet   Poor balance    Several falls w/o injury  Enc safety- use of device for ambulation/walker  Ref to PT for balance/gait training       Relevant Orders   Ambulatory referral to Physical Therapy   Routine general medical examination at a health care facility - Primary    Reviewed health habits including diet and exercise and skin cancer prevention Reviewed appropriate screening tests for age  Also reviewed health mt list, fam hx and immunization status , as well as social and family history   See HPI Labs rev  Ref done to PT to prevent falls and suggested use of walking device - to continue independence at advanced age Declines further tx /eval of OP - rec inc vit D Ref to cardiology to disc carotid stenosis as planned  Enc inc in fluid intake Toe nails are overgrown-pt plans to get new grinder since old one broke -if not effective will ref to pod Will begin tx hypothyroidism       Vitamin D deficiency    Encouraged compliance with his vit D  Level is in the 30s-would like above 50 Disc imp to bone and overall health

## 2017-09-11 NOTE — Telephone Encounter (Signed)
Lm with spouse for patient to call office .  Had to r/s Appt with Dr. Percival Spanish to  A new patient slot.

## 2017-09-11 NOTE — Patient Instructions (Addendum)
Use your walker as much as possible to prevent falls   I think physical therapy would be excellent to help with balance and fall prevention   Try to drink more water  Aim for 64 oz of fluids per day (mostly water)   Start the generic synthroid (levothyroxine) - take it first thing in the am 30 minutes before breakfast or any other medicines  I sent [facility] to the [facility] [place]  We will schedule non fasting labs for 6 weeks to re check your tsh   We will work on an appt with Dr Percival Spanish for you   Get a new grinder for your toe nails to work on them

## 2017-09-13 NOTE — Assessment & Plan Note (Signed)
Now with low FT4 Will begin levothyroxine 25 mcg daily -in am at least 30 min prior to food or meds  Plan lab for 6 wk Update if side effects  Lab Results  Component Value Date   TSH 8.08 (H) 09/10/2017

## 2017-09-13 NOTE — Assessment & Plan Note (Signed)
bp in fair control at this time  BP Readings from Last 1 Encounters:  09/11/17 128/66   No changes needed Disc lifstyle change with low sodium diet and exercise  Labs reviewed

## 2017-09-13 NOTE — Assessment & Plan Note (Signed)
Ref to PT for balance No injuries-but has OP

## 2017-09-13 NOTE — Assessment & Plan Note (Signed)
Encouraged compliance with his vit D  Level is in the 30s-would like above 50 Disc imp to bone and overall health

## 2017-09-13 NOTE — Assessment & Plan Note (Signed)
Several falls w/o injury  Enc safety- use of device for ambulation/walker  Ref to PT for balance/gait training

## 2017-09-13 NOTE — Assessment & Plan Note (Signed)
Declines further monitoring or tx (despite falls and fx hx) Stressed imp of ca and D  Also exercise safely as tolerated

## 2017-09-13 NOTE — Assessment & Plan Note (Signed)
Disc goals for lipids and reasons to control them Rev labs with pt Rev low sat fat diet in detail Fair control without statin at this time (was formerly on zocor)

## 2017-09-13 NOTE — Assessment & Plan Note (Addendum)
Reviewed health habits including diet and exercise and skin cancer prevention Reviewed appropriate screening tests for age  Also reviewed health mt list, fam hx and immunization status , as well as social and family history   See HPI Labs rev  Ref done to PT to prevent falls and suggested use of walking device - to continue independence at advanced age Declines further tx /eval of OP - rec inc vit D Ref to cardiology to disc carotid stenosis as planned  Enc inc in fluid intake Toe nails are overgrown-pt plans to get new grinder since old one broke -if not effective will ref to pod Will begin tx hypothyroidism

## 2017-09-13 NOTE — Assessment & Plan Note (Signed)
Last carotid study showed minimal stenosis bilat  He desires cardiology visit with Dr Percival Spanish to discuss a plan for this  Continue asa  Disc goals for lipids and good health habits

## 2017-09-13 NOTE — Assessment & Plan Note (Signed)
B12 level remains supra therapeutic with oral supplementation

## 2017-09-14 ENCOUNTER — Telehealth: Payer: Self-pay | Admitting: *Deleted

## 2017-09-14 NOTE — Telephone Encounter (Signed)
Called pt and he had some questions about referrals and changing appt location. Phone call sent to [facility]

## 2017-09-14 NOTE — Telephone Encounter (Signed)
Patient called and insisted on talking to Dr. Marliss Coots assistant per the [facility].

## 2017-09-15 NOTE — Telephone Encounter (Signed)
Called and rescheduled the patient in a New Patient slot.

## 2017-09-16 ENCOUNTER — Ambulatory Visit (INDEPENDENT_AMBULATORY_CARE_PROVIDER_SITE_OTHER): Payer: Medicare HMO

## 2017-09-16 DIAGNOSIS — I6523 Occlusion and stenosis of bilateral carotid arteries: Secondary | ICD-10-CM

## 2017-09-24 ENCOUNTER — Telehealth: Payer: Self-pay | Admitting: Cardiology

## 2017-09-24 NOTE — Telephone Encounter (Signed)
Spoke to patient - not sure who called . No telephone message informed patient the will call if needed Patient verbalized understanding

## 2017-09-24 NOTE — Telephone Encounter (Signed)
New message ° °Pt verbalized that he is returning call for RN °

## 2017-10-08 ENCOUNTER — Encounter: Payer: Self-pay | Admitting: Cardiology

## 2017-10-13 ENCOUNTER — Ambulatory Visit: Payer: Medicare HMO | Admitting: Cardiology

## 2017-10-15 NOTE — Progress Notes (Deleted)
Cardiology Office Note   Date:  10/15/2017   ID:  [name], DOB [birth year], MRN [id number]  PCP:  Gregory Greenspan, MD  Cardiologist:   No primary care provider on file. Referring:  ***  No chief complaint on file.     History of Present Illness: [name] is a 84 y.o. male who presents for ***    He has been seen in the past for mild carotid plaque and this was imaged last month.  ***   Past Medical History:  Diagnosis Date  . Cancer [facility])    prostate  . Carotid artery stenosis   . Degenerative disc disease   . Fractures    compression  . History of prostate cancer   . Hyperlipidemia   . Hypertension   . Kyphosis   . Non-cardiac chest pain 04/11   hospital ? [facility]    Past Surgical History:  Procedure Laterality Date  . CARDIOVASCULAR STRESS TEST  2004   cardiolite negative  . carotid doppler  11/05   0-39% bilatteral  . herniated disc surgery  1990   L3-4  . [place]   right  . prostate cancer surgery    . PROSTATE SURGERY    . TONSILLECTOMY       Current Outpatient Medications  Medication Sig Dispense Refill  . aspirin EC 81 MG EC tablet Take 81 mg by mouth daily.      Marland Kitchen guaiFENesin (MUCINEX) 600 MG 12 hr tablet Take 1,200 mg by mouth as needed.    Marland Kitchen levothyroxine (SYNTHROID, LEVOTHROID) 25 MCG tablet Take 1 tablet (25 mcg total) by mouth daily before breakfast. 30 tablet 11  . Loratadine 10 MG CAPS Take by mouth as needed.    . SELENIUM PO Take by mouth.    . trandolapril (MAVIK) 2 MG tablet Take 1 tablet (2 mg total) by mouth daily. 90 tablet 3  . vitamin B-12 (CYANOCOBALAMIN) 100 MCG tablet Take 100 mcg by mouth. 3 times a week    . VITAMIN D, CHOLECALCIFEROL, PO Take 1 tablet by mouth daily.       No current facility-administered medications for this visit.     Allergies:   Simvastatin    Social History:  The patient  reports that  has never smoked. he has never used smokeless tobacco. He reports that he drinks  alcohol. He reports that he does not use drugs.   Family History:  The patient's ***family history includes Hypertension in his father.    ROS:  Please see the history of present illness.   Otherwise, review of systems are positive for {NONE DEFAULTED:18576::"none"}.   All other systems are reviewed and negative.    PHYSICAL EXAM: VS:  There were no vitals taken for this visit. , BMI There is no height or weight on file to calculate BMI. GENERAL:  Well appearing HEENT:  Pupils equal round and reactive, fundi not visualized, oral mucosa unremarkable NECK:  No jugular venous distention, waveform within normal limits, carotid upstroke brisk and symmetric, no bruits, no thyromegaly LYMPHATICS:  No cervical, inguinal adenopathy LUNGS:  Clear to auscultation bilaterally BACK:  No CVA tenderness CHEST:  Unremarkable HEART:  PMI not displaced or sustained,S1 and S2 within normal limits, no S3, no S4, no clicks, no rubs, *** murmurs ABD:  Flat, positive bowel sounds normal in frequency in pitch, no bruits, no rebound, no guarding, no midline pulsatile mass, no hepatomegaly, no splenomegaly EXT:  2 plus pulses throughout, no edema, no cyanosis no clubbing SKIN:  No rashes no nodules NEURO:  Cranial nerves II through XII grossly intact, motor grossly intact throughout PSYCH:  Cognitively intact, oriented to person place and time    EKG:  EKG {ACTION; IS/IS QMG:50037048} ordered today. The ekg ordered today demonstrates ***   Recent Labs: 09/10/2017: ALT 11; BUN 28; Creatinine, Ser 1.17; Hemoglobin 13.2; Platelets 338.0; Potassium 4.3; Sodium 137; TSH 8.08    Lipid Panel    Component Value Date/Time   CHOL 163 09/10/2017 0927   TRIG 137.0 09/10/2017 0927   HDL 37.00 (L) 09/10/2017 0927   CHOLHDL 4 09/10/2017 0927   VLDL 27.4 09/10/2017 0927   LDLCALC 99 09/10/2017 0927      Wt Readings from Last 3 Encounters:  09/11/17 150 lb 4 oz (68.2 kg)  05/01/17 150 lb (68 kg)  12/31/16 147 lb  6.4 oz (66.9 kg)      Other studies Reviewed: Additional studies/ records that were reviewed today include: ***. Review of the above records demonstrates:  Please see elsewhere in the note.  ***   ASSESSMENT AND PLAN:  CAROTID ARTERY STENOSIS:  This was minimal in Jan.  ***  HTN:  ***   Current medicines are reviewed at length with the patient today.  The patient {ACTIONS; HAS/DOES NOT HAVE:19233} concerns regarding medicines.  The following changes have been made:  {PLAN; NO CHANGE:13088:s}  Labs/ tests ordered today include: *** No orders of the defined types were placed in this encounter.    Disposition:   FU with ***    Signed, Minus Breeding, MD  10/15/2017 10:36 AM     [facility]

## 2017-10-16 ENCOUNTER — Ambulatory Visit: Payer: Medicare HMO | Admitting: Cardiology

## 2017-11-04 NOTE — Progress Notes (Signed)
Cardiology Office Note   Date:  11/05/2017   ID:  [name], DOB 1920-09-05, MRN [id number]  PCP:  Abner Greenspan, MD  Cardiologist:   No primary care provider on file. Referring:  Self  Chief Complaint  Patient presents with  . PVD      History of Present Illness: [name] is a 84 y.o. male who presents for evaluation of carotic plaque.  I saw him several years ago for evaluation of this.  He is been doing relatively well from a cardiovascular standpoint.  His plaque in his carotid was still mild when he had a repeated last month and I reviewed these results.  He is a retired Chief Financial Officer.  [place] but sounds like he has limited activities with his age.  He feeds the cats.  He goes up and down stairs. The patient denies any new symptoms such as chest discomfort, neck or arm discomfort. There has been no new shortness of breath, PND or orthopnea. There have been no reported palpitations, presyncope or syncope.  Past Medical History:  Diagnosis Date  . Cancer [facility])    prostate  . Carotid artery stenosis   . Degenerative disc disease   . Fractures    compression  . History of prostate cancer   . Hyperlipidemia   . Hypertension   . Kyphosis   . Non-cardiac chest pain 04/11   hospital ? [facility]    Past Surgical History:  Procedure Laterality Date  . CARDIOVASCULAR STRESS TEST  2004   cardiolite negative  . carotid doppler  11/05   0-39% bilatteral  . herniated disc surgery  1990   L3-4  . [place]   right  . prostate cancer surgery    . PROSTATE SURGERY    . TONSILLECTOMY       Current Outpatient Medications  Medication Sig Dispense Refill  . aspirin EC 81 MG EC tablet Take 81 mg by mouth daily.      Marland Kitchen guaiFENesin (MUCINEX) 600 MG 12 hr tablet Take 1,200 mg by mouth as needed.    Marland Kitchen levothyroxine (SYNTHROID, LEVOTHROID) 25 MCG tablet Take 1 tablet (25 mcg total) by mouth daily before breakfast. 30 tablet 11  . Loratadine 10 MG  CAPS Take by mouth as needed.    . SELENIUM PO Take by mouth.    . trandolapril (MAVIK) 2 MG tablet Take 1 tablet (2 mg total) by mouth daily. 90 tablet 3  . vitamin B-12 (CYANOCOBALAMIN) 100 MCG tablet Take 100 mcg by mouth. 3 times a week    . VITAMIN D, CHOLECALCIFEROL, PO Take 1 tablet by mouth daily.       No current facility-administered medications for this visit.     Allergies:   Simvastatin    Social History:  The patient  reports that  has never smoked. he has never used smokeless tobacco. He reports that he drinks alcohol. He reports that he does not use drugs.   Family History:  The patient's family history includes Hypertension in his father.    ROS:  Please see the history of present illness.   Otherwise, review of systems are positive for none.   All other systems are reviewed and negative.    PHYSICAL EXAM: VS:  BP (!) 102/58   Pulse 62   Ht 5\' 3"  (1.6 m)   Wt 151 lb (68.5 kg)   BMI 26.75 kg/m  , BMI Body  mass index is 26.75 kg/m. GENERAL:  Well appearing HEENT:  Pupils equal round and reactive, fundi not visualized, oral mucosa unremarkable NECK:  No jugular venous distention, waveform within normal limits, carotid upstroke brisk and symmetric, no bruits, no thyromegaly LYMPHATICS:  No cervical, inguinal adenopathy LUNGS:  Clear to auscultation bilaterally BACK:  No CVA tenderness CHEST:   Kyphosis HEART:  PMI not displaced or sustained,S1 and S2 within normal limits, no S3, no S4, no clicks, no rubs, no murmurs ABD:  Flat, positive bowel sounds normal in frequency in pitch, no bruits, no rebound, no guarding, no midline pulsatile mass, no hepatomegaly, no splenomegaly EXT:  2 plus pulses throughout, no edema, no cyanosis no clubbing SKIN:  No rashes no nodules NEURO:  Cranial nerves II through XII grossly intact, motor grossly intact throughout PSYCH:  Cognitively intact, oriented to person place and time    EKG:  EKG is ordered today. The ekg ordered  today demonstrates sinus rhythm, rate 62, axis within normal limits, intervals within normal limits, no acute ST-T wave changes.  Premature ventricular contractions.   Recent Labs: 09/10/2017: ALT 11; BUN 28; Creatinine, Ser 1.17; Hemoglobin 13.2; Platelets 338.0; Potassium 4.3; Sodium 137; TSH 8.08    Lipid Panel    Component Value Date/Time   CHOL 163 09/10/2017 0927   TRIG 137.0 09/10/2017 0927   HDL 37.00 (L) 09/10/2017 0927   CHOLHDL 4 09/10/2017 0927   VLDL 27.4 09/10/2017 0927   LDLCALC 99 09/10/2017 0927      Wt Readings from Last 3 Encounters:  11/05/17 151 lb (68.5 kg)  09/11/17 150 lb 4 oz (68.2 kg)  05/01/17 150 lb (68 kg)      Other studies Reviewed: Additional studies/ records that were reviewed today include: Carotid. Review of the above records demonstrates:  Please see elsewhere in the note.     ASSESSMENT AND PLAN:  CAROTID ARTERY STENOSIS:  This was minimal in Jan.  No change in therapy or intervention is indicated.  HTN: His blood pressure is actually running low.  I told him to keep a blood pressure diary and if he is running persistently in the 110s or lower this would be a good opportunity to reduce his medication given his advanced age.  PVCs:  He does not feel these. No change in therapy.    Current medicines are reviewed at length with the patient today.  The patient does not have concerns regarding medicines.  The following changes have been made:  no change  Labs/ tests ordered today include: None  Orders Placed This Encounter  Procedures  . EKG 12-Lead     Disposition:   FU with me as needed.     Signed, Minus Breeding, MD  11/05/2017 3:31 PM    [facility]

## 2017-11-05 ENCOUNTER — Encounter: Payer: Self-pay | Admitting: Cardiology

## 2017-11-05 ENCOUNTER — Ambulatory Visit: Payer: Medicare HMO | Admitting: Cardiology

## 2017-11-05 VITALS — BP 102/58 | HR 62 | Ht 63.0 in | Wt 151.0 lb

## 2017-11-05 DIAGNOSIS — I10 Essential (primary) hypertension: Secondary | ICD-10-CM

## 2017-11-05 DIAGNOSIS — I493 Ventricular premature depolarization: Secondary | ICD-10-CM | POA: Diagnosis not present

## 2017-11-05 DIAGNOSIS — I739 Peripheral vascular disease, unspecified: Secondary | ICD-10-CM | POA: Diagnosis not present

## 2017-11-05 NOTE — Patient Instructions (Signed)
Medication Instructions:  Continue current medications  If you need a refill on your cardiac medications before your next appointment, please call your pharmacy.  Labwork: None Ordered  Testing/Procedures: None Ordered  Follow-Up: Your physician wants you to follow-up in: As Needed.      Thank you for choosing [facility]!!       

## 2018-02-10 ENCOUNTER — Telehealth: Payer: Self-pay

## 2018-02-10 NOTE — Telephone Encounter (Signed)
Copied from [place]. Topic: Inquiry >> Feb 10, 2018  3:29 PM Conception Chancy, NT wrote: Reason for CRM: patient is requesting Dr. Glori Bickers to give him a call in regards to a application with [employer].

## 2018-02-11 NOTE — Telephone Encounter (Signed)
Pt called re: his desire to get VA assistance for his son.  To do this he himself has to be qualified as 30% disabled by one or more conditions. He drafted a document regarding this and would like me to review it (discussing intermittent respiratory symptoms that he gets)  He will drop it by for my review.   I told him I do not know If I will be able to help or not.

## 2018-02-15 ENCOUNTER — Telehealth: Payer: Self-pay | Admitting: Family Medicine

## 2018-02-15 NOTE — Telephone Encounter (Signed)
Pt dropped off letter for dr tower In dr tower in box

## 2018-02-19 ENCOUNTER — Telehealth: Payer: Self-pay

## 2018-02-19 NOTE — Telephone Encounter (Signed)
Copied from [place] [id number]. Topic: Inquiry >> Feb 19, 2018  4:54 PM Oliver Pila B wrote: Reason for CRM: pt called to speak w/ Dr. Glori Bickers about a statement the pt has asked her to prepare, call pt to advise

## 2018-02-22 ENCOUNTER — Encounter: Payer: Self-pay | Admitting: Family Medicine

## 2018-02-22 NOTE — Telephone Encounter (Signed)
Left VM letting pt know letter is ready for pick up 

## 2018-02-22 NOTE — Telephone Encounter (Signed)
Letter is done and in IN box.  

## 2018-03-15 ENCOUNTER — Encounter: Payer: Self-pay | Admitting: Family Medicine

## 2018-03-15 ENCOUNTER — Ambulatory Visit (INDEPENDENT_AMBULATORY_CARE_PROVIDER_SITE_OTHER): Payer: Medicare HMO | Admitting: Family Medicine

## 2018-03-15 VITALS — BP 118/50 | HR 74 | Temp 98.1°F | Ht 62.0 in | Wt 150.2 lb

## 2018-03-15 DIAGNOSIS — R350 Frequency of micturition: Secondary | ICD-10-CM

## 2018-03-15 LAB — POC URINALSYSI DIPSTICK (AUTOMATED)
Bilirubin, UA: NEGATIVE
Blood, UA: NEGATIVE
Glucose, UA: NEGATIVE
Ketones, UA: NEGATIVE
Leukocytes, UA: NEGATIVE
Nitrite, UA: NEGATIVE
Protein, UA: NEGATIVE
Spec Grav, UA: 1.015 (ref 1.010–1.025)
Urobilinogen, UA: 0.2 E.U./dL
pH, UA: 7.5 (ref 5.0–8.0)

## 2018-03-15 MED ORDER — TOLTERODINE TARTRATE ER 4 MG PO CP24: 4.0000 mg | ORAL_CAPSULE | Freq: Every day | ORAL | 11 refills | Status: DC

## 2018-03-15 NOTE — Assessment & Plan Note (Signed)
With incontinence every since prostatectomy years ago for prostate cancer No longer sees urology  Denies urinary retention  UA clear  Px trial of detrol LA for overactive bladder symptoms  Watch for side effect-dry mouth/sedation  Alert if not helpful-would recommend f/u with urology

## 2018-03-15 NOTE — Progress Notes (Signed)
Subjective:    Patient ID: [name], male    DOB: 03/31/1920, 84 y.o.   MRN: [id number]  HPI Here for frequent urination   Has always gone frequently since his prostate surgery years ago 1995  Worse now - over the past year  During the day- he urinates 2-3 times  Symptoms are mostly at night   3-4 times / keeps him from sleeping  No pain with urination  No change in color or smell of urine  No blood in urine   Wt Readings from Last 3 Encounters:  03/15/18 150 lb 4 oz (68.2 kg)  11/05/17 151 lb (68.5 kg)  09/11/17 150 lb 4 oz (68.2 kg)   27.48 kg/m    UA is clear  Results for orders placed or performed in visit on 03/15/18  POCT Urinalysis Dipstick (Automated)  Result Value Ref Range   Color, UA Yellow    Clarity, UA Clear    Glucose, UA Negative Negative   Bilirubin, UA Negative    Ketones, UA Negative    Spec Grav, UA 1.015 1.010 - 1.025   Blood, UA Negative    pH, UA 7.5 5.0 - 8.0   Protein, UA Negative Negative   Urobilinogen, UA 0.2 0.2 or 1.0 E.U./dL   Nitrite, UA Negative    Leukocytes, UA Negative Negative     Hx of prostate cancer with prostatectomy in the past  Last urology visit (years ago- his urologist died)  Baseline incontinence and nocturia  Never gave him any medication for his symptoms   Most incontinence is also at night- wears pads   He tries to watch fluid intake before he goes to bed    Patient Active Problem List   Diagnosis Date Noted  . PVD (peripheral vascular disease) ([place]) 11/05/2017  . Fall at home 09/11/2017  . Poor balance 09/11/2017  . Vitamin D deficiency 08/28/2016  . Routine general medical examination at a health care facility 08/24/2015  . B12 deficiency 08/14/2014  . Low back pain 05/02/2014  . Urinary frequency 05/02/2014  . Encounter for Medicare annual wellness exam 07/27/2013  . Motion sickness 04/04/2013  . Osteopenia 06/14/2012  . Hypothyroid 06/14/2012  . MACROCYTIC ANEMIA 07/29/2010  . KYPHOSIS  07/18/2010  . FECAL OCCULT BLOOD 02/01/2010  . Essential hypertension 02/23/2009  . Carotid artery stenosis 02/23/2009  . SPONDYLOSIS, LUMBAR 02/23/2009  . NECK PAIN, CHRONIC 02/23/2009  . BACK PAIN, CHRONIC 02/23/2009  . H/O compression fracture of spine 02/23/2009  . HYPERCHOLESTEROLEMIA 02/21/2009  . History of prostate cancer 02/21/2009   Past Medical History:  Diagnosis Date  . Cancer [facility])    prostate  . Carotid artery stenosis   . Degenerative disc disease   . Fractures    compression  . History of prostate cancer   . Hyperlipidemia   . Hypertension   . Kyphosis   . Non-cardiac chest pain 04/11   hospital ? [facility]   Past Surgical History:  Procedure Laterality Date  . CARDIOVASCULAR STRESS TEST  2004   cardiolite negative  . carotid doppler  11/05   0-39% bilatteral  . herniated disc surgery  1990   L3-4  . [place]   right  . prostate cancer surgery    . PROSTATE SURGERY    . TONSILLECTOMY     Social History   Tobacco Use  . Smoking status: Never Smoker  . Smokeless tobacco: Never Used  Substance Use Topics  .  Alcohol use: Yes    Alcohol/week: 0.0 oz    Comment: 2 drinks per day  . Drug use: No   Family History  Problem Relation Age of Onset  . Hypertension Father    Allergies  Allergen Reactions  . Simvastatin     Aches and pains   Current Outpatient Medications on File Prior to Visit  Medication Sig Dispense Refill  . aspirin EC 81 MG EC tablet Take 81 mg by mouth daily.      Marland Kitchen guaiFENesin (MUCINEX) 600 MG 12 hr tablet Take 1,200 mg by mouth as needed.    Marland Kitchen levothyroxine (SYNTHROID, LEVOTHROID) 25 MCG tablet Take 1 tablet (25 mcg total) by mouth daily before breakfast. 30 tablet 11  . Loratadine 10 MG CAPS Take by mouth as needed.    . SELENIUM PO Take by mouth.    . trandolapril (MAVIK) 2 MG tablet Take 1 tablet (2 mg total) by mouth daily. (Patient taking differently: Take by mouth. 3-4 x weekly) 90 tablet 3  . vitamin  B-12 (CYANOCOBALAMIN) 100 MCG tablet Take 100 mcg by mouth. 3 times a week    . VITAMIN D, CHOLECALCIFEROL, PO Take 1 tablet by mouth daily.       No current facility-administered medications on file prior to visit.     Review of Systems  Constitutional: Negative for activity change, appetite change, fatigue, fever and unexpected weight change.  HENT: Negative for congestion, rhinorrhea, sore throat and trouble swallowing.   Eyes: Negative for pain, redness, itching and visual disturbance.  Respiratory: Negative for cough, chest tightness, shortness of breath and wheezing.   Cardiovascular: Negative for chest pain and palpitations.  Gastrointestinal: Negative for abdominal distention, abdominal pain, blood in stool, constipation, diarrhea and nausea.  Endocrine: Negative for cold intolerance, heat intolerance, polydipsia and polyuria.  Genitourinary: Positive for frequency. Negative for decreased urine volume, difficulty urinating, dysuria, enuresis, flank pain, hematuria, scrotal swelling, testicular pain and urgency.       Denies problems emptying bladder   Incontinent of urine   Musculoskeletal: Negative for arthralgias, joint swelling and myalgias.  Skin: Negative for pallor and rash.  Neurological: Negative for dizziness, tremors, weakness, numbness and headaches.  Hematological: Negative for adenopathy. Does not bruise/bleed easily.  Psychiatric/Behavioral: Negative for decreased concentration and dysphoric mood. The patient is not nervous/anxious.        Stressors        Objective:   Physical Exam  Constitutional: He appears well-developed and well-nourished. No distress.  Frail appearing elderly male   HENT:  Head: Normocephalic and atraumatic.  Eyes: Pupils are equal, round, and reactive to light. Conjunctivae and EOM are normal.  Neck: Normal range of motion. Neck supple.  Cardiovascular: Normal rate, regular rhythm and normal heart sounds.  Pulmonary/Chest: Effort normal  and breath sounds normal.  Abdominal: Soft. Bowel sounds are normal. He exhibits no distension. There is no hepatosplenomegaly. There is tenderness. There is no rigidity, no rebound, no guarding and no CVA tenderness.  No cva tenderness  no suprapubic tenderness or fullness   Musculoskeletal: He exhibits no edema.  kyphosis  Lymphadenopathy:    He has no cervical adenopathy.  Neurological: He is alert.  Skin: No rash noted.  Psychiatric: He has a normal mood and affect.          Assessment & Plan:   Problem List Items Addressed This Visit      Other   Urinary frequency - Primary    With incontinence  every since prostatectomy years ago for prostate cancer No longer sees urology  Denies urinary retention  UA clear  Px trial of detrol LA for overactive bladder symptoms  Watch for side effect-dry mouth/sedation  Alert if not helpful-would recommend f/u with urology      Relevant Orders   POCT Urinalysis Dipstick (Automated) (Completed)

## 2018-03-15 NOTE — Patient Instructions (Addendum)
Your urine test is completely clear today  I think your frequent urination may be due to overactive bladder and consequences of your prostate surgery   Try the generic detrol one pill daily  Goal is to slow down frequency and perhaps some incontinence  If no improvement in 7-10 days - stop taking it Keep [place] posted   If no improvement - next step would be a urology visit   Avoid excessive caffeine and alcohol  Make sure you drink enough water during the day

## 2018-03-18 ENCOUNTER — Telehealth: Payer: Self-pay | Admitting: *Deleted

## 2018-03-18 MED ORDER — OXYBUTYNIN CHLORIDE ER 5 MG PO TB24: 5.0000 mg | ORAL_TABLET | Freq: Every day | ORAL | 5 refills | Status: DC

## 2018-03-18 NOTE — Telephone Encounter (Signed)
Dolly, pharmacist at [facility] advise me that the preferred medication for pt's insurance is Oxybutynin or Oxybutynin XR, both are covered

## 2018-03-18 NOTE — Telephone Encounter (Signed)
Spoke to pt who states it was the medication he was prescribed at his most recent OV. After reviewing note, pt confirms med was detrol, but is unaware what medication can be covered in its place. pls advise

## 2018-03-18 NOTE — Telephone Encounter (Signed)
I sent oxybutynin xr to his pharmacy  Alert me if any problems

## 2018-03-18 NOTE — Telephone Encounter (Signed)
Please ask pharmacy what would be covered better for overactive bladder ? Vesicare/ditropan/myrbetriq/ or other ? Thanks  There should be one that is more affordable

## 2018-03-18 NOTE — Telephone Encounter (Signed)
Copied from [place] [id number]. Topic: Inquiry >> Mar 18, 2018 10:54 AM Conception Chancy, NT wrote: Reason for CRM: patient is calling and requesting Dr. Glori Bickers give him a call. In regards to a prescription that cost $200. He is unaware of the name.

## 2018-03-22 ENCOUNTER — Telehealth: Payer: Self-pay | Admitting: Family Medicine

## 2018-03-22 DIAGNOSIS — M1711 Unilateral primary osteoarthritis, right knee: Secondary | ICD-10-CM | POA: Diagnosis not present

## 2018-03-22 NOTE — Telephone Encounter (Signed)
I changed it to oxybutinin anyway - aware of possible side effect (of either of those actually)  Want to go forward with the oxybutinin

## 2018-03-22 NOTE — Telephone Encounter (Signed)
Copied from [place] [id number]. Topic: Quick Communication - Rx Refill/Question >> Mar 22, 2018  9:35 AM Cecelia Byars, NT wrote: Medication detrol  4 mg  ,the pharmacy called and said there is a condition interaction , do not use in patients with hyperplasia of prostate , please advise    Has the patient contacted their pharmacy? yes  (Agent: If no, request that the patient contact the pharmacy for the refill.) (Agent: If yes, when and what did the pharmacy advise?)  Preferred Pharm[facility] phone number [facility]  [phone]  Agent: Please be advised that RX refills may take up to 3 business days. We ask that you follow-up with your pharmacy.

## 2018-03-22 NOTE — Telephone Encounter (Signed)
Left VM with pharmacy letting them know Dr. Marliss Coots comments

## 2018-03-22 NOTE — Telephone Encounter (Signed)
Oxybutynin 5 mg 24 hr tab is on med list.Please advise.

## 2018-04-30 DIAGNOSIS — R269 Unspecified abnormalities of gait and mobility: Secondary | ICD-10-CM | POA: Diagnosis not present

## 2018-04-30 DIAGNOSIS — Z7722 Contact with and (suspected) exposure to environmental tobacco smoke (acute) (chronic): Secondary | ICD-10-CM | POA: Diagnosis not present

## 2018-04-30 DIAGNOSIS — R32 Unspecified urinary incontinence: Secondary | ICD-10-CM | POA: Diagnosis not present

## 2018-04-30 DIAGNOSIS — I739 Peripheral vascular disease, unspecified: Secondary | ICD-10-CM | POA: Diagnosis not present

## 2018-04-30 DIAGNOSIS — G629 Polyneuropathy, unspecified: Secondary | ICD-10-CM | POA: Diagnosis not present

## 2018-04-30 DIAGNOSIS — Z9181 History of falling: Secondary | ICD-10-CM | POA: Diagnosis not present

## 2018-04-30 DIAGNOSIS — I10 Essential (primary) hypertension: Secondary | ICD-10-CM | POA: Diagnosis not present

## 2018-04-30 DIAGNOSIS — E039 Hypothyroidism, unspecified: Secondary | ICD-10-CM | POA: Diagnosis not present

## 2018-04-30 DIAGNOSIS — Z85828 Personal history of other malignant neoplasm of skin: Secondary | ICD-10-CM | POA: Diagnosis not present

## 2018-04-30 DIAGNOSIS — G8929 Other chronic pain: Secondary | ICD-10-CM | POA: Diagnosis not present

## 2018-06-03 ENCOUNTER — Encounter (INDEPENDENT_AMBULATORY_CARE_PROVIDER_SITE_OTHER): Payer: Self-pay

## 2018-06-03 ENCOUNTER — Encounter: Payer: Self-pay | Admitting: Family Medicine

## 2018-06-03 ENCOUNTER — Ambulatory Visit: Payer: Self-pay

## 2018-06-03 ENCOUNTER — Ambulatory Visit (INDEPENDENT_AMBULATORY_CARE_PROVIDER_SITE_OTHER): Payer: Medicare HMO | Admitting: Family Medicine

## 2018-06-03 VITALS — BP 146/56 | HR 50 | Temp 97.8°F | Ht 62.0 in | Wt 146.0 lb

## 2018-06-03 DIAGNOSIS — Y92009 Unspecified place in unspecified non-institutional (private) residence as the place of occurrence of the external cause: Secondary | ICD-10-CM | POA: Diagnosis not present

## 2018-06-03 DIAGNOSIS — S0101XA Laceration without foreign body of scalp, initial encounter: Secondary | ICD-10-CM | POA: Diagnosis not present

## 2018-06-03 DIAGNOSIS — W19XXXA Unspecified fall, initial encounter: Secondary | ICD-10-CM

## 2018-06-03 DIAGNOSIS — R69 Illness, unspecified: Secondary | ICD-10-CM | POA: Diagnosis not present

## 2018-06-03 MED ORDER — TRANDOLAPRIL 2 MG PO TABS: 2.0000 mg | ORAL_TABLET | ORAL | Status: DC

## 2018-06-03 NOTE — Patient Instructions (Signed)
Change the dressing as needed, tomorrow.  If any new symptoms, then go to the ER or dial 911.   Update [place] as needed.  Take care.  Glad to see you.

## 2018-06-03 NOTE — Telephone Encounter (Signed)
Pt wife called stating that her husband had fallen down 2 stairs backwards onto the garage floor. Wife states that he got up and walked inside on his own. Per wife pt is alert and never lost consciousness. He has a 50 cent size red area on the back of his head. He has a pin whole size laceration which bleed a lot but has slowed to an ooze now. Wife stated that she wanted to take him to the hospital but he refused and wanted seen at the office. He denies a headache. He denies taking blood thinners. He has back pain at 7 on 1-10 scale. Appointment made by flow coordinator Rena per protocol. Care advice read to wife. She verbalized understanding of all instructions. Reason for Disposition . [1] Age over 53 years AND [2] swelling or bruise  Answer Assessment - Initial Assessment Questions 1. MECHANISM: "How did the injury happen?" For falls, ask: "What height did you fall from?" and "What surface did you fall against?"      Cement floor two steps 2. ONSET: "When did the injury happen?" (Minutes or hours ago)      2hrs 3. NEUROLOGIC SYMPTOMS: "Was there any loss of consciousness?" "Are there any other neurological symptoms?"      no 4. MENTAL STATUS: "Does the person know who he is, who you are, and where he is?"      Alert oriented 5. LOCATION: "What part of the head was hit?"      Back of head 6. SCALP APPEARANCE: "What does the scalp look like? Is it bleeding now?" If so, ask: "Is it difficult to stop?"      red area size of 50 cent piece bleeding 7. SIZE: For cuts, bruises, or swelling, ask: "How large is it?" (e.g., inches or centimeters)     " Like a pin hole" 8. PAIN: "Is there any pain?" If so, ask: "How bad is it?"  (e.g., Scale 1-10; or mild, moderate, severe)     No headache, back pain 7 9. TETANUS: For any breaks in the skin, ask: "When was the last tetanus booster?"     Recent up to date 10. OTHER SYMPTOMS: "Do you have any other symptoms?" (e.g., neck pain, vomiting)       Back  pain 11. PREGNANCY: "Is there any chance you are pregnant?" "When was your last menstrual period?"       N/A  Protocols used: HEAD INJURY-A-AH

## 2018-06-03 NOTE — Telephone Encounter (Signed)
I spoke with Jenny Reichmann RN; pt fell off 2 steps backward and hit head; did not lose consciousness; Jenny Reichmann said her understanding is oozing blood from back of head with opening the size of a pin hole. Pt also having back pain but is alert and orientated.  Pt refused ED disposition. Dr Damita Dunnings said he would see pt at 3:00 pm today. FYI to Dr Damita Dunnings.

## 2018-06-04 DIAGNOSIS — S0101XA Laceration without foreign body of scalp, initial encounter: Secondary | ICD-10-CM | POA: Insufficient documentation

## 2018-06-04 NOTE — Progress Notes (Signed)
Patient worked into the schedule today.  He was home about 1:00 in the afternoon today.  He had a mechanical fall.  He was carrying some groceries in the garage and lost his balance.  No syncope.  Not lightheaded.  No loss of consciousness with the fall.  He fell backward and hit his upper back and head.  Not on any anticoagulants.  He was able to get up and walk.  He had a scalp laceration that was noted at the time.  Wife was able to get it to stop bleeding after she cleaned it up, but it started bleeding again in the meantime.  They called in about evaluation.  We offered to work him into the schedule.  He has some left upper posterior back soreness, not in the midline.  No chest pain.  Not short of breath.  Moving all extremities.  He recalls all the events of today.  He has been taking trandolapril every other day in the meantime.  Meds, vitals, and allergies reviewed.   ROS: Per HPI unless specifically indicated in ROS section   GEN: nad, alert and oriented.  Dried blood noted on the scalp.  This was cleaned off and he was noted only to have a very small epithelial disruption on the scalp.  It was only a few millimeters across, about 4 mm across.  Clearly subcentimeter.  The wound was not gaping.  Bleeding was controlled with direct pressure.  Upon removal of the pressure, he did not have any additional bleeding. HEENT: mucous membranes moist NECK: supple w/o LA CV: rrr. PULM: ctab, no inc wob ABD: soft, +bs EXT: no edema SKIN: no acute rash o/w.  No bruising otherwise. CN 2-12 wnl B, S/S wnl x4.  Alert and oriented x3.  Neck and back nontender in the midline.  Left upper back tender to palpation but not in the midline.

## 2018-06-04 NOTE — Assessment & Plan Note (Signed)
This sounds to be an isolated incident that was a mechanical fall.  Routine cautions given.  I will defer to PCP about further management.  He is not lightheaded.  No syncope.  No loss of consciousness.  No apparent need for head CT at this point.  Discussed with patient.  He agrees.   It is possible that he could have a concussion.  He has no obvious concussion symptoms at this point.  Should he develop any symptoms then I want him to update [place].  Still okay for outpatient follow-up.  Plan discussed with patient and wife.  All questions answered.  Plan agreed.  >25 minutes spent in face to face time with patient, >50% spent in counselling or coordination of care.

## 2018-06-04 NOTE — Assessment & Plan Note (Signed)
He does not need suture.  We put a nonstick bandage on the area and padded that with 4 x 4's and secured it with coban.  That should suffice for the next 24 hours.  Change bandage tomorrow if needed.  Routine cautions given.  Tetanus is up-to-date.

## 2018-07-15 DIAGNOSIS — H2511 Age-related nuclear cataract, right eye: Secondary | ICD-10-CM | POA: Diagnosis not present

## 2018-09-02 ENCOUNTER — Telehealth: Payer: Self-pay | Admitting: Family Medicine

## 2018-09-02 DIAGNOSIS — E538 Deficiency of other specified B group vitamins: Secondary | ICD-10-CM

## 2018-09-02 DIAGNOSIS — E039 Hypothyroidism, unspecified: Secondary | ICD-10-CM

## 2018-09-02 DIAGNOSIS — E7800 Pure hypercholesterolemia, unspecified: Secondary | ICD-10-CM

## 2018-09-02 DIAGNOSIS — E559 Vitamin D deficiency, unspecified: Secondary | ICD-10-CM

## 2018-09-02 DIAGNOSIS — I10 Essential (primary) hypertension: Secondary | ICD-10-CM

## 2018-09-02 NOTE — Telephone Encounter (Signed)
-----   Message from Eustace Pen, LPN sent at 17/00/1749  3:08 PM EST ----- Regarding: Labs 12/30 Lab orders needed. Thank you.  Insurance:  [employer]

## 2018-09-06 ENCOUNTER — Ambulatory Visit (INDEPENDENT_AMBULATORY_CARE_PROVIDER_SITE_OTHER): Payer: Medicare HMO

## 2018-09-06 VITALS — BP 118/60 | HR 62 | Temp 97.7°F | Ht 63.0 in | Wt 147.8 lb

## 2018-09-06 DIAGNOSIS — E538 Deficiency of other specified B group vitamins: Secondary | ICD-10-CM | POA: Diagnosis not present

## 2018-09-06 DIAGNOSIS — Z0000 Encounter for general adult medical examination without abnormal findings: Secondary | ICD-10-CM | POA: Diagnosis not present

## 2018-09-06 DIAGNOSIS — E039 Hypothyroidism, unspecified: Secondary | ICD-10-CM | POA: Diagnosis not present

## 2018-09-06 DIAGNOSIS — E559 Vitamin D deficiency, unspecified: Secondary | ICD-10-CM | POA: Diagnosis not present

## 2018-09-06 DIAGNOSIS — I10 Essential (primary) hypertension: Secondary | ICD-10-CM | POA: Diagnosis not present

## 2018-09-06 LAB — LIPID PANEL
Cholesterol: 165 mg/dL (ref 0–200)
HDL: 41.6 mg/dL (ref >39.00)
LDL Cholesterol: 106 mg/dL — ABNORMAL HIGH (ref 0–99)
NonHDL: 123.63
Total CHOL/HDL Ratio: 4
Triglycerides: 90 mg/dL (ref 0.0–149.0)
VLDL: 18 mg/dL (ref 0.0–40.0)

## 2018-09-06 LAB — COMPREHENSIVE METABOLIC PANEL
ALT: 13 U/L (ref 0–53)
AST: 18 U/L (ref 0–37)
Albumin: 3.8 g/dL (ref 3.5–5.2)
Alkaline Phosphatase: 60 U/L (ref 39–117)
BUN: 30 mg/dL — ABNORMAL HIGH (ref 6–23)
CO2: 27 mEq/L (ref 19–32)
Calcium: 9.3 mg/dL (ref 8.4–10.5)
Chloride: 104 mEq/L (ref 96–112)
Creatinine, Ser: 1.3 mg/dL (ref 0.40–1.50)
GFR: 54.35 mL/min — ABNORMAL LOW (ref >60.00)
Glucose, Bld: 87 mg/dL (ref 70–99)
Potassium: 4.7 mEq/L (ref 3.5–5.1)
Sodium: 137 mEq/L (ref 135–145)
Total Bilirubin: 0.4 mg/dL (ref 0.2–1.2)
Total Protein: 6.5 g/dL (ref 6.0–8.3)

## 2018-09-06 LAB — CBC WITH DIFFERENTIAL/PLATELET
Basophils Absolute: 0 10*3/uL (ref 0.0–0.1)
Basophils Relative: 0.4 % (ref 0.0–3.0)
Eosinophils Absolute: 0.1 10*3/uL (ref 0.0–0.7)
Eosinophils Relative: 1.8 % (ref 0.0–5.0)
HCT: 37.2 % — ABNORMAL LOW (ref 39.0–52.0)
Hemoglobin: 12.6 g/dL — ABNORMAL LOW (ref 13.0–17.0)
Lymphocytes Relative: 14.3 % (ref 12.0–46.0)
Lymphs Abs: 1.2 10*3/uL (ref 0.7–4.0)
MCHC: 34 g/dL (ref 30.0–36.0)
MCV: 98.1 fl (ref 78.0–100.0)
Monocytes Absolute: 1 10*3/uL (ref 0.1–1.0)
Monocytes Relative: 12 % (ref 3.0–12.0)
Neutro Abs: 6.1 10*3/uL (ref 1.4–7.7)
Neutrophils Relative %: 71.5 % (ref 43.0–77.0)
Platelets: 356 10*3/uL (ref 150.0–400.0)
RBC: 3.79 Mil/uL — ABNORMAL LOW (ref 4.22–5.81)
RDW: 13.2 % (ref 11.5–15.5)
WBC: 8.5 10*3/uL (ref 4.0–10.5)

## 2018-09-06 LAB — TSH: TSH: 4.46 u[IU]/mL (ref 0.35–4.50)

## 2018-09-06 LAB — VITAMIN B12: Vitamin B-12: >1500 pg/mL — ABNORMAL HIGH (ref 211–911)

## 2018-09-06 LAB — VITAMIN D 25 HYDROXY (VIT D DEFICIENCY, FRACTURES): VITD: 35.62 ng/mL (ref 30.00–100.00)

## 2018-09-06 NOTE — Progress Notes (Signed)
Subjective:   [name] is a 84 y.o. male who presents for Medicare Annual/Subsequent preventive examination.  Review of Systems:  N/A Cardiac Risk Factors include: advanced age (>98men, >19 women);male gender;dyslipidemia;hypertension     Objective:    Vitals: BP 118/60 (BP Location: Left Arm, Patient Position: Sitting, Cuff Size: Normal)   Pulse 62   Temp 97.7 F (36.5 C) (Oral)   Ht 5\' 3"  (1.6 m) Comment: shoes  Wt 147 lb 12 oz (67 kg)   SpO2 98%   BMI 26.17 kg/m   Body mass index is 26.17 kg/m.  Advanced Directives 09/06/2018 08/29/2016  Does Patient Have a Medical Advance Directive? Yes Yes  Type of Paramedic of [place];Living will [place];Living will  Copy of [place] in Chart? No - copy requested No - copy requested    Tobacco Social History   Tobacco Use  Smoking Status Never Smoker  Smokeless Tobacco Never Used     Counseling given: No   Clinical Intake:  Pre-visit preparation completed: Yes  Pain : No/denies pain Pain Score: 0-No pain     Nutritional Status: BMI 25 -29 Overweight Nutritional Risks: Unintentional weight loss Diabetes: No  How often do you need to have someone help you when you read instructions, pamphlets, or other written materials from your doctor or pharmacy?: 1 - Never What is the last grade level you completed in school?: Bachelor degree  Interpreter Needed?: No  Comments: pt lives with spouse Information entered by :: LPinson, LPN  Past Medical History:  Diagnosis Date  . Cancer [facility])    prostate  . Carotid artery stenosis   . Degenerative disc disease   . Fractures    compression  . History of prostate cancer   . Hyperlipidemia   . Hypertension   . Kyphosis   . Non-cardiac chest pain 04/11   hospital ? [facility]   Past Surgical History:  Procedure Laterality Date  . CARDIOVASCULAR STRESS TEST  2004   cardiolite negative  . carotid doppler   11/05   0-39% bilatteral  . herniated disc surgery  1990   L3-4  . [place]   right  . prostate cancer surgery    . PROSTATE SURGERY    . TONSILLECTOMY     Family History  Problem Relation Age of Onset  . Hypertension Father    Social History   Socioeconomic History  . Marital status: Married    Spouse name: Not on file  . Number of children: Not on file  . Years of education: Not on file  . Highest education level: Not on file  Occupational History  . 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 Financial resource strain: Not on file  . Food insecurity:    Worry: Not on file    Inability: Not on file  . Transportation needs:    Medical: Not on file    Non-medical: Not on file  Tobacco Use  . Smoking status: Never Smoker  . Smokeless tobacco: Never Used  Substance and Sexual Activity  . Alcohol use: Yes    Alcohol/week: 14.0 standard drinks    Types: 14 Glasses of wine per week  . Drug use: No  . Sexual activity: Never  Lifestyle  . Physical activity:    Days per week: Not on file    Minutes  per session: Not on file  . Stress: Not on file  Relationships  . Social connections:    Talks on phone: Not on file    Gets together: Not on file    Attends religious service: Not on file    Active member of club or organization: Not on file    Attends meetings of clubs or organizations: Not on file    Relationship status: Not on file  Other Topics Concern  . 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 aspirin EC 81 MG EC tablet Take 81 mg by mouth daily.    Marland Kitchen guaiFENesin (MUCINEX) 600 MG 12 hr tablet Take 1,200 mg by mouth as needed.  Marland Kitchen levothyroxine (SYNTHROID, LEVOTHROID) 25 MCG tablet Take 1 tablet (25 mcg total) by mouth daily before breakfast.  . Loratadine 10 MG  CAPS Take by mouth as needed.  Marland Kitchen oxybutynin (DITROPAN-XL) 5 MG 24 hr tablet Take 1 tablet (5 mg total) by mouth at bedtime.  . SELENIUM PO Take by mouth.  . vitamin B-12 (CYANOCOBALAMIN) 100 MCG tablet Take 100 mcg by mouth daily.   Marland Kitchen VITAMIN D, CHOLECALCIFEROL, PO Take 1 tablet by mouth daily.    . [DISCONTINUED] trandolapril (MAVIK) 2 MG tablet Take 1 tablet (2 mg total) by mouth every other day. (Patient not taking: Reported on 09/06/2018)   No facility-administered encounter medications on file as of 09/06/2018.     Activities of Daily Living In your present state of health, do you have any difficulty performing the following activities: 09/06/2018  Hearing? Y  Vision? Y  Difficulty concentrating or making decisions? N  Walking or climbing stairs? Y  Dressing or bathing? N  Doing errands, shopping? N  Preparing Food and eating ? N  Using the Toilet? N  In the past six months, have you accidently leaked urine? N  Do you have problems with loss of bowel control? N  Managing your Medications? N  Managing your Finances? N  Housekeeping or managing your Housekeeping? N  Some recent data might be hidden    Patient Care Team: Tower, Wynelle Fanny, MD as PCP - General Dingeldein, Remo Lipps, MD as Consulting Physician (Ophthalmology) Almedia Balls, MD as Consulting Physician (Orthopedic Surgery) Aldona Lento, DDS as Consulting Physician (Dentistry)   Assessment:   This is a routine wellness examination for [name].   Hearing Screening   125Hz  250Hz  500Hz  1000Hz  2000Hz  3000Hz  4000Hz  6000Hz  8000Hz   Right ear:   40 0 0  0    Left ear:   40 40 0  0    Vision Screening Comments: Vision exam in October 2019 with Dr. Sandra Cockayne   Exercise Activities and Dietary recommendations Current Exercise Habits: The patient does not participate in regular exercise at present, Exercise limited by: None identified  Goals    . Patient Stated     Starting 09/06/2018, I will continue to take medications as  prescribed.        Fall Risk Fall Risk  09/06/2018 09/11/2017 08/29/2016 08/24/2015 08/14/2014  Falls in the past year? 1 Yes Yes No No  Number falls in past yr: 1 2 or more 2 or more - -  Injury with Fall? 1 No No - -  Risk Factor Category  - High Fall Risk High Fall Risk - -  Risk for fall due to : Impaired  balance/gait;Impaired mobility;Impaired vision;History of fall(s) - - - -  Risk for fall due to: Comment - - - - -  Follow up - - Falls prevention discussed - -   Depression Screen PHQ 2/9 Scores 09/06/2018 09/11/2017 08/29/2016 08/24/2015  PHQ - 2 Score 0 1 0 0  PHQ- 9 Score 0 - - -    Cognitive Function MMSE - Mini Mental State Exam 09/06/2018 08/29/2016  Orientation to time 5 5  Orientation to Place 5 5  Registration 3 3  Attention/ Calculation 0 0  Recall 3 3  Language- name 2 objects 0 0  Language- repeat 1 1  Language- follow 3 step command 3 3  Language- read & follow direction 0 0  Write a sentence 0 0  Copy design 0 0  Total score 20 20       PLEASE NOTE: A Mini-Cog screen was completed. Maximum score is 20. A value of 0 denotes this part of [place] MMSE was not completed or the patient failed this part of the Mini-Cog screening.   Mini-Cog Screening Orientation to Time - Max 5 pts Orientation to Place - Max 5 pts Registration - Max 3 pts Recall - Max 3 pts Language Repeat - Max 1 pts Language Follow 3 Step Command - Max 3 pts\  Immunization History  Administered Date(s) Administered  . Influenza Split 06/05/2011  . Influenza Whole 05/18/2012  . Influenza, High Dose Seasonal PF 06/13/2017, 06/03/2018  . Influenza,inj,Quad PF,6+ Mos 06/03/2016  . Influenza-Unspecified 05/25/2013, 05/30/2014  . Pneumococcal Conjugate-13 08/14/2014  . Pneumococcal Polysaccharide-23 11/08/2002  . Td 09/08/2000, 06/14/2012  . Tdap 12/31/2016  . Zoster 07/08/2005    Screening Tests Health Maintenance  Topic Date Due  . TETANUS/TDAP  01/01/2027  . INFLUENZA VACCINE   Completed  . PNA vac Low Risk Adult  Completed     Plan:     I have personally reviewed, addressed, and noted the following in the patient's chart:  A. Medical and social history B. Use of alcohol, tobacco or illicit drugs  C. Current medications and supplements D. Functional ability and status E.  Nutritional status F.  Physical activity G. Advance directives H. List of other physicians I.  Hospitalizations, surgeries, and ER visits in previous 12 months J.  [place] to include hearing, vision, cognitive, depression L. Referrals and appointments - none  In addition, I have reviewed and discussed with patient certain preventive protocols, quality metrics, and best practice recommendations. A written personalized care plan for preventive services as well as general preventive health recommendations were provided to patient.  See attached scanned questionnaire for additional information.   Signed,   Lindell Noe, MHA, BS, LPN Health Coach

## 2018-09-06 NOTE — Patient Instructions (Signed)
[name] , Thank you for taking time to come for your Medicare Wellness Visit. I appreciate your ongoing commitment to your health goals. Please review the following plan we discussed and let me know if I can assist you in the future.   These are the goals we discussed: Goals    . Patient Stated     Starting 09/06/2018, I will continue to take medications as prescribed.        This is a list of the screening recommended for you and due dates:  Health Maintenance  Topic Date Due  . Tetanus Vaccine  01/01/2027  . Flu Shot  Completed  . Pneumonia vaccines  Completed   Preventive Care for Adults  A healthy lifestyle and preventive care can promote health and wellness. Preventive health guidelines for adults include the following key practices.  . A routine yearly physical is a good way to check with your health care provider about your health and preventive screening. It is a chance to share any concerns and updates on your health and to receive a thorough exam.  . Visit your dentist for a routine exam and preventive care every 6 months. Brush your teeth twice a day and floss once a day. Good oral hygiene prevents tooth decay and gum disease.  . The frequency of eye exams is based on your age, health, family medical history, use  of contact lenses, and other factors. Follow your health care provider's recommendations for frequency of eye exams.  . Eat a healthy diet. Foods like vegetables, fruits, whole grains, low-fat dairy products, and lean protein foods contain the nutrients you need without too many calories. Decrease your intake of foods high in solid fats, added sugars, and salt. Eat the right amount of calories for you. Get information about a proper diet from your health care provider, if necessary.  . Regular physical exercise is one of the most important things you can do for your health. Most adults should get at least 150 minutes of moderate-intensity exercise (any activity  that increases your heart rate and causes you to sweat) each week. In addition, most adults need muscle-strengthening exercises on 2 or more days a week.  Silver Sneakers may be a benefit available to you. To determine eligibility, you may visit the website: www.silversneakers.com or contact program at 951-657-8096 Mon-Fri between 8AM-8PM.   . Maintain a healthy weight. The body mass index (BMI) is a screening tool to identify possible weight problems. It provides an estimate of body fat based on height and weight. Your health care provider can find your BMI and can help you achieve or maintain a healthy weight.   For adults 20 years and older: ? A BMI below 18.5 is considered underweight. ? A BMI of 18.5 to 24.9 is normal. ? A BMI of 25 to 29.9 is considered overweight. ? A BMI of 30 and above is considered obese.   . Maintain normal blood lipids and cholesterol levels by exercising and minimizing your intake of saturated fat. Eat a balanced diet with plenty of fruit and vegetables. Blood tests for lipids and cholesterol should begin at age 63 and be repeated every 5 years. If your lipid or cholesterol levels are high, you are over 50, or you are at high risk for heart disease, you may need your cholesterol levels checked more frequently. Ongoing high lipid and cholesterol levels should be treated with medicines if diet and exercise are not working.  . If you smoke, find  out from your health care provider how to quit. If you do not use tobacco, please do not start.  . If you choose to drink alcohol, please do not consume more than 2 drinks per day. One drink is considered to be 12 ounces (355 mL) of beer, 5 ounces (148 mL) of wine, or 1.5 ounces (44 mL) of liquor.  . If you are 28-4 years old, ask your health care provider if you should take aspirin to prevent strokes.  . Use sunscreen. Apply sunscreen liberally and repeatedly throughout the day. You should seek shade when your shadow is  shorter than you. Protect yourself by wearing long sleeves, pants, a wide-brimmed hat, and sunglasses year round, whenever you are outdoors.  . Once a month, do a whole body skin exam, using a mirror to look at the skin on your back. Tell your health care provider of new moles, moles that have irregular borders, moles that are larger than a pencil eraser, or moles that have changed in shape or color.

## 2018-09-06 NOTE — Progress Notes (Signed)
PCP notes:   Health maintenance:  No gaps identified.  Abnormal screenings:   Hearing - failed  Hearing Screening   125Hz  250Hz  500Hz  1000Hz  2000Hz  3000Hz  4000Hz  6000Hz  8000Hz   Right ear:   40 0 0  0    Left ear:   40 40 0  0     Fall risk - hx of multiple falls Fall Risk  09/06/2018 09/11/2017 08/29/2016 08/24/2015 08/14/2014  Falls in the past year? 1 Yes Yes No No  Number falls in past yr: 1 2 or more 2 or more - -  Injury with Fall? 1 No No - -  Risk Factor Category  - High Fall Risk High Fall Risk - -  Risk for fall due to : Impaired balance/gait;Impaired mobility;Impaired vision;History of fall(s) - - - -  Risk for fall due to: Comment - - - - -  Follow up - - Falls prevention discussed - -   Patient concerns:   None  Nurse concerns:  None  Next PCP appt:   09/14/18 @ 0930  I reviewed health advisor's note, was available for consultation, and agree with documentation and plan. Loura Pardon MD

## 2018-09-14 ENCOUNTER — Encounter: Payer: Self-pay | Admitting: Family Medicine

## 2018-09-14 ENCOUNTER — Ambulatory Visit (INDEPENDENT_AMBULATORY_CARE_PROVIDER_SITE_OTHER): Payer: Medicare HMO | Admitting: Family Medicine

## 2018-09-14 VITALS — BP 140/70 | HR 61 | Temp 97.9°F | Ht 63.0 in | Wt 145.8 lb

## 2018-09-14 DIAGNOSIS — Z8546 Personal history of malignant neoplasm of prostate: Secondary | ICD-10-CM | POA: Diagnosis not present

## 2018-09-14 DIAGNOSIS — E7800 Pure hypercholesterolemia, unspecified: Secondary | ICD-10-CM | POA: Diagnosis not present

## 2018-09-14 DIAGNOSIS — H9190 Unspecified hearing loss, unspecified ear: Secondary | ICD-10-CM | POA: Insufficient documentation

## 2018-09-14 DIAGNOSIS — M8588 Other specified disorders of bone density and structure, other site: Secondary | ICD-10-CM | POA: Diagnosis not present

## 2018-09-14 DIAGNOSIS — E538 Deficiency of other specified B group vitamins: Secondary | ICD-10-CM

## 2018-09-14 DIAGNOSIS — Z0000 Encounter for general adult medical examination without abnormal findings: Secondary | ICD-10-CM

## 2018-09-14 DIAGNOSIS — E559 Vitamin D deficiency, unspecified: Secondary | ICD-10-CM | POA: Diagnosis not present

## 2018-09-14 DIAGNOSIS — I6529 Occlusion and stenosis of unspecified carotid artery: Secondary | ICD-10-CM

## 2018-09-14 DIAGNOSIS — E039 Hypothyroidism, unspecified: Secondary | ICD-10-CM

## 2018-09-14 DIAGNOSIS — D649 Anemia, unspecified: Secondary | ICD-10-CM | POA: Diagnosis not present

## 2018-09-14 DIAGNOSIS — H9193 Unspecified hearing loss, bilateral: Secondary | ICD-10-CM

## 2018-09-14 DIAGNOSIS — I739 Peripheral vascular disease, unspecified: Secondary | ICD-10-CM | POA: Diagnosis not present

## 2018-09-14 DIAGNOSIS — I10 Essential (primary) hypertension: Secondary | ICD-10-CM

## 2018-09-14 DIAGNOSIS — L602 Onychogryphosis: Secondary | ICD-10-CM

## 2018-09-14 MED ORDER — LEVOTHYROXINE SODIUM 25 MCG PO TABS: 25.0000 ug | ORAL_TABLET | Freq: Every day | ORAL | 11 refills | Status: DC

## 2018-09-14 MED ORDER — VITAMIN B-12 100 MCG PO TABS: 100.0000 ug | ORAL_TABLET | ORAL | 0 refills | Status: AC

## 2018-09-14 MED ORDER — OXYBUTYNIN CHLORIDE ER 5 MG PO TB24: 5.0000 mg | ORAL_TABLET | Freq: Every day | ORAL | 11 refills | Status: DC

## 2018-09-14 NOTE — Assessment & Plan Note (Signed)
With past comp fx Declines further eval or tx Continues vit D One fall  Disc fall prev Kyphosis is significant

## 2018-09-14 NOTE — Assessment & Plan Note (Signed)
Unable to trim his nails He plans to make his own podiatry appt for this

## 2018-09-14 NOTE — Patient Instructions (Addendum)
If hearing becomes a problem - we will refer you to a specialist so let us know   Please use cane or walker if you feel unsteady   Cut your vitamin B12 to every other day - your level is high   Please get to the foot doctor for nail care when you can

## 2018-09-14 NOTE — Assessment & Plan Note (Signed)
Declines hearing aides or further eval due to cost  Disc safety issues Enc him to call if he changes his mind

## 2018-09-14 NOTE — Assessment & Plan Note (Signed)
Continues f/u with cardiology  Working to control bp  Intol if statin

## 2018-09-14 NOTE — Assessment & Plan Note (Signed)
Disc goals for lipids and reasons to control them Rev last labs with pt Rev low sat fat diet in detail Intolerant of statins

## 2018-09-14 NOTE — Assessment & Plan Note (Signed)
Hb stable at 12.6 No symptoms  Continue to obs

## 2018-09-14 NOTE — Assessment & Plan Note (Signed)
Continues f/u with cardiology No symptoms  No longer on statin due to side eff

## 2018-09-14 NOTE — Progress Notes (Signed)
Subjective:    Patient ID: [name], male    DOB: 1920-05-08, 85 y.o.   MRN: [id number]  HPI  Here for health maintenance exam and to review chronic medical problems    Has felt fairly well overall  Getting older   Getting cortisone shots in knee - sees Dr Lynann Bologna   Had amw on 12/30 Failed hearing eval -higher frequencies (he denies significant problem and does not want hearing aides)  1 fall this year   Fall prevention- is trying to be careful  Balance is bad  Uses cane or walker when needed (he tends to leave and forget his cane)   zostavax 10/06 utd imms   Wt Readings from Last 3 Encounters:  09/14/18 145 lb 12 oz (66.1 kg)  09/06/18 147 lb 12 oz (67 kg)  06/03/18 146 lb (66.2 kg)  appetite is not as good as it used to be /but still decent  Eating left overs - needs to cook more  No regular exercise - just day to day  25.82 kg/m  Lost some wt from a year ago- 5 llb  Hx of prostatectomy Has incontinence  Takes oxybutynin for this -is helps some /still wears a pad   Op with comp fx in the past  Declines further w/u or tx for this  dexa was years ago  Vit D level is 35.6 No fractures this year One fall   bp is stable today - was rushing to get here  bp is sometimes low at home - watching that  No cp or palpitations or headaches or edema  No side effects to medicines  BP Readings from Last 3 Encounters:  09/14/18 (!) 142/70  09/06/18 118/60  06/03/18 (!) 146/56     H/o carotid artery stenosis -sees cardiology (used to take trandolopril -was taken off of it)  Takes asa 81 mg daily  PVD-no symptoms or problems    B12 def Lab Results  Component Value Date   VITAMINB12 >1500 (H) 09/06/2018   This was high   Hyperlipidemia Lab Results  Component Value Date   CHOL 165 09/06/2018   CHOL 163 09/10/2017   CHOL 158 08/29/2016   Lab Results  Component Value Date   HDL 41.60 09/06/2018   HDL 37.00 (L) 09/10/2017   HDL 41.10 08/29/2016   Lab  Results  Component Value Date   LDLCALC 106 (H) 09/06/2018   LDLCALC 99 09/10/2017   LDLCALC 93 08/29/2016   Lab Results  Component Value Date   TRIG 90.0 09/06/2018   TRIG 137.0 09/10/2017   TRIG 120.0 08/29/2016   Lab Results  Component Value Date   CHOLHDL 4 09/06/2018   CHOLHDL 4 09/10/2017   CHOLHDL 4 08/29/2016   No results found for: LDLDIRECT Used to be on a statin -was intolerant    H/o anemia  Lab Results  Component Value Date   WBC 8.5 09/06/2018   HGB 12.6 (L) 09/06/2018   HCT 37.2 (L) 09/06/2018   MCV 98.1 09/06/2018   PLT 356.0 09/06/2018   was macrocytic in the past  Drinks alcohol - less than one glass per day   Lab Results  Component Value Date   CREATININE 1.30 09/06/2018   BUN 30 (H) 09/06/2018   NA 137 09/06/2018   K 4.7 09/06/2018   CL 104 09/06/2018   CO2 27 09/06/2018   Lab Results  Component Value Date   ALT 13 09/06/2018   AST 18 09/06/2018  ALKPHOS 60 09/06/2018   BILITOT 0.4 09/06/2018    Hypothyroidism  Pt has no clinical changes No change in energy level/ hair or skin/ edema and no tremor Lab Results  Component Value Date   TSH 4.46 09/06/2018     Patient Active Problem List   Diagnosis Date Noted  . Hearing loss 09/14/2018  . Thickened nails 09/14/2018  . Scalp laceration 06/04/2018  . PVD (peripheral vascular disease) ([place]) 11/05/2017  . Fall at home 09/11/2017  . Poor balance 09/11/2017  . Vitamin D deficiency 08/28/2016  . Routine general medical examination at a health care facility 08/24/2015  . B12 deficiency 08/14/2014  . Low back pain 05/02/2014  . Urinary frequency 05/02/2014  . Encounter for Medicare annual wellness exam 07/27/2013  . Motion sickness 04/04/2013  . Osteopenia 06/14/2012  . Hypothyroid 06/14/2012  . Aplastic anemia ([place]) 07/29/2010  . 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 FECAL OCCULT BLOOD 02/01/2010  . ANEMIA, MILD 01/23/2010  . Essential hypertension 02/23/2009  . Carotid artery stenosis  02/23/2009  . SPONDYLOSIS, LUMBAR 02/23/2009  . NECK PAIN, CHRONIC 02/23/2009  . BACK PAIN, CHRONIC 02/23/2009  . H/O compression fracture of spine 02/23/2009  . HYPERCHOLESTEROLEMIA 02/21/2009  . History of prostate cancer 02/21/2009   Past Medical History:  Diagnosis Date  . Cancer [facility])    prostate  . Carotid artery stenosis   . Degenerative disc disease   . Fractures    compression  . History of prostate cancer   . Hyperlipidemia   . Hypertension   . Kyphosis   . Non-cardiac chest pain 04/11   hospital ? [facility]   Past Surgical History:  Procedure Laterality Date  . CARDIOVASCULAR STRESS TEST  2004   cardiolite negative  . carotid doppler  11/05   0-39% bilatteral  . herniated disc surgery  1990   L3-4  . [place]   right  . prostate cancer surgery    . PROSTATE SURGERY    . TONSILLECTOMY     Social History   Tobacco Use  . Smoking status: Never Smoker  . Smokeless tobacco: Never Used  Substance Use Topics  . Alcohol use: Yes    Alcohol/week: 14.0 standard drinks    Types: 14 Glasses of wine per week  . Drug use: No   Family History  Problem Relation Age of Onset  . Hypertension Father    Allergies  Allergen Reactions  . Simvastatin     Aches and pains   Current Outpatient Medications on File Prior to Visit  Medication Sig Dispense Refill  . aspirin EC 81 MG EC tablet Take 81 mg by mouth daily.      Marland Kitchen guaiFENesin (MUCINEX) 600 MG 12 hr tablet Take 1,200 mg by mouth as needed.    . Loratadine 10 MG CAPS Take by mouth as needed.    . SELENIUM PO Take by mouth.    Marland Kitchen VITAMIN D, CHOLECALCIFEROL, PO Take 1 tablet by mouth daily.       No current facility-administered medications on file prior to visit.      Review of Systems  Constitutional: Negative for activity change, appetite change, fatigue, fever and unexpected weight change.  HENT: Negative for congestion, rhinorrhea, sore throat and trouble swallowing.   Eyes: Negative for  pain, redness, itching and visual disturbance.  Respiratory: Negative for cough, chest tightness, shortness of breath and wheezing.   Cardiovascular: Negative for chest pain and palpitations.  Gastrointestinal: Negative for abdominal pain,  blood in stool, constipation, diarrhea and nausea.  Endocrine: Negative for cold intolerance, heat intolerance, polydipsia and polyuria.  Genitourinary: Negative for difficulty urinating, dysuria, frequency and urgency.  Musculoskeletal: Positive for arthralgias, back pain and gait problem. Negative for joint swelling and myalgias.  Skin: Negative for pallor and rash.       Thick toe nails Unable to cut them himself  Neurological: Negative for dizziness, tremors, weakness, numbness and headaches.       Poor balance   Hematological: Negative for adenopathy. Does not bruise/bleed easily.  Psychiatric/Behavioral: Negative for confusion, decreased concentration and dysphoric mood. The patient is not nervous/anxious.        Objective:   Physical Exam Constitutional:      General: He is not in acute distress.    Appearance: Normal appearance. He is well-developed and normal weight. He is not ill-appearing.     Comments: Frail appearing elderly male  HENT:     Head: Normocephalic and atraumatic.     Right Ear: Tympanic membrane, ear canal and external ear normal.     Left Ear: Tympanic membrane, ear canal and external ear normal.     Nose: Nose normal.     Mouth/Throat:     Mouth: Mucous membranes are moist.     Pharynx: Oropharynx is clear.  Eyes:     General: No scleral icterus.       Right eye: No discharge.        Left eye: No discharge.     Conjunctiva/sclera: Conjunctivae normal.     Pupils: Pupils are equal, round, and reactive to light.  Neck:     Musculoskeletal: Normal range of motion and neck supple.     Thyroid: No thyromegaly.     Vascular: No carotid bruit or JVD.  Cardiovascular:     Rate and Rhythm: Normal rate and regular rhythm.      Heart sounds: Normal heart sounds. No gallop.   Pulmonary:     Effort: Pulmonary effort is normal. No respiratory distress.     Breath sounds: Normal breath sounds. No wheezing.  Chest:     Chest wall: No tenderness.  Abdominal:     General: Bowel sounds are normal. There is no distension or abdominal bruit.     Palpations: Abdomen is soft. There is no mass.     Tenderness: There is no abdominal tenderness.  Musculoskeletal:        General: No tenderness.     Right lower leg: No edema.     Left lower leg: No edema.     Comments: Mod to severe kyphosis   Lymphadenopathy:     Cervical: No cervical adenopathy.  Skin:    General: Skin is warm and dry.     Capillary Refill: Capillary refill takes less than 2 seconds.     Coloration: Skin is not pale.     Findings: No erythema or rash.     Comments: Solar aging .lentigines diffusely  SKs   Very thickened toe nails that curl under   Neurological:     Mental Status: He is alert. Mental status is at baseline.     Cranial Nerves: No cranial nerve deficit.     Motor: No abnormal muscle tone.     Coordination: Coordination normal.     Deep Tendon Reflexes: Reflexes are normal and symmetric. Reflexes normal.     Comments: Poor balance noted   Psychiatric:        Mood and Affect: Mood  normal.     Comments: Good mood  Slowed cognition but not impaired            Assessment & Plan:   Problem List Items Addressed This Visit      Cardiovascular and Mediastinum   PVD (peripheral vascular disease) ([place])    Continues f/u with cardiology  Working to control bp  Intol if statin       Essential hypertension    bp in fair control at this time  BP Readings from Last 1 Encounters:  09/14/18 140/70   No changes needed Most recent labs reviewed  Disc lifstyle change with low sodium diet and exercise        Carotid artery stenosis    Continues f/u with cardiology No symptoms  No longer on statin due to side eff         Endocrine   Hypothyroid    Hypothyroidism  Pt has no clinical changes No change in energy level/ hair or skin/ edema and no tremor Lab Results  Component Value Date   TSH 4.46 09/06/2018           Relevant Medications   levothyroxine (SYNTHROID, LEVOTHROID) 25 MCG tablet     Nervous and Auditory   Hearing loss    Declines hearing aides or further eval due to cost  Disc safety issues Enc him to call if he changes his mind        Musculoskeletal and Integument   Osteopenia    With past comp fx Declines further eval or tx Continues vit D One fall  Disc fall prev Kyphosis is significant         Other   Vitamin D deficiency    Vitamin D level is therapeutic with current supplementation Disc importance of this to bone and overall health       Thickened nails    Unable to trim his nails He plans to make his own podiatry appt for this        Routine general medical examination at a health care facility - Primary    Reviewed health habits including diet and exercise and skin cancer prevention Reviewed appropriate screening tests for age  Also reviewed health mt list, fam hx and immunization status , as well as social and family history   See HPI amw rev Declines hearing augmentation  Will f/u with podiatry for toe nail care  Fall risk discussed-strongly end walker or cane at all times (also great caution with etoh) Declines eval or tx for OP  Labs reviewed       HYPERCHOLESTEROLEMIA    Disc goals for lipids and reasons to control them Rev last labs with pt Rev low sat fat diet in detail Intolerant of statins       History of prostate cancer    Incontinence after prostatectomy  oxybutinin helps       B12 deficiency    Level is high  inst to dose supplement QOD      RESOLVED: Anemia   Relevant Medications   vitamin B-12 (CYANOCOBALAMIN) 100 MCG tablet

## 2018-09-14 NOTE — Assessment & Plan Note (Signed)
Incontinence after prostatectomy  oxybutinin helps

## 2018-09-14 NOTE — Assessment & Plan Note (Signed)
Vitamin D level is therapeutic with current supplementation Disc importance of this to bone and overall health  

## 2018-09-14 NOTE — Assessment & Plan Note (Signed)
Level is high  inst to dose supplement QOD

## 2018-09-14 NOTE — Assessment & Plan Note (Signed)
Hypothyroidism  Pt has no clinical changes No change in energy level/ hair or skin/ edema and no tremor Lab Results  Component Value Date   TSH 4.46 09/06/2018

## 2018-09-14 NOTE — Assessment & Plan Note (Signed)
bp in fair control at this time  BP Readings from Last 1 Encounters:  09/14/18 140/70   No changes needed Most recent labs reviewed  Disc lifstyle change with low sodium diet and exercise

## 2018-09-14 NOTE — Assessment & Plan Note (Signed)
Reviewed health habits including diet and exercise and skin cancer prevention Reviewed appropriate screening tests for age  Also reviewed health mt list, fam hx and immunization status , as well as social and family history   See HPI amw rev Declines hearing augmentation  Will f/u with podiatry for toe nail care  Fall risk discussed-strongly end walker or cane at all times (also great caution with etoh) Declines eval or tx for OP  Labs reviewed

## 2018-10-07 ENCOUNTER — Other Ambulatory Visit: Payer: Self-pay | Admitting: Family Medicine

## 2018-10-14 ENCOUNTER — Other Ambulatory Visit: Payer: Self-pay | Admitting: *Deleted

## 2018-10-14 MED ORDER — OXYBUTYNIN CHLORIDE ER 5 MG PO TB24: 5.0000 mg | ORAL_TABLET | Freq: Every day | ORAL | 10 refills | Status: DC

## 2019-02-25 ENCOUNTER — Telehealth: Payer: Self-pay | Admitting: *Deleted

## 2019-02-25 DIAGNOSIS — S2242XA Multiple fractures of ribs, left side, initial encounter for closed fracture: Secondary | ICD-10-CM | POA: Diagnosis not present

## 2019-02-25 NOTE — Telephone Encounter (Signed)
Patient called office this a/m about having xray done.  Advised patient he would need to see his Provider for him to order xray. Office did not have any open availability today for appointments. Patient advised to go to [facility] for full eval and xray.  Office number and address where given to patient

## 2019-02-25 NOTE — Telephone Encounter (Signed)
Copied from [place] #[id number]. Topic: Appointment Scheduling - Scheduling Inquiry for Clinic >> Feb 24, 2019  4:27 PM Celene Kras A wrote: Reason for CRM: Pt called stating he had a fall and he has a bad back. Pt is requesting to get an x-ray done. Please advise.

## 2019-06-07 ENCOUNTER — Emergency Department (HOSPITAL_COMMUNITY): Payer: Medicare HMO

## 2019-06-07 ENCOUNTER — Telehealth: Payer: Self-pay

## 2019-06-07 ENCOUNTER — Emergency Department (HOSPITAL_COMMUNITY)
Admission: EM | Admit: 2019-06-07 | Discharge: 2019-06-07 | Disposition: A | Discharge status: Home / Self Care | Payer: Medicare HMO | Attending: Emergency Medicine | Admitting: Emergency Medicine

## 2019-06-07 ENCOUNTER — Other Ambulatory Visit: Payer: Self-pay

## 2019-06-07 DIAGNOSIS — Z79899 Other long term (current) drug therapy: Secondary | ICD-10-CM | POA: Insufficient documentation

## 2019-06-07 DIAGNOSIS — R001 Bradycardia, unspecified: Secondary | ICD-10-CM | POA: Diagnosis not present

## 2019-06-07 DIAGNOSIS — Z8546 Personal history of malignant neoplasm of prostate: Secondary | ICD-10-CM | POA: Insufficient documentation

## 2019-06-07 DIAGNOSIS — Z7982 Long term (current) use of aspirin: Secondary | ICD-10-CM | POA: Diagnosis not present

## 2019-06-07 DIAGNOSIS — N3000 Acute cystitis without hematuria: Secondary | ICD-10-CM | POA: Diagnosis not present

## 2019-06-07 DIAGNOSIS — I10 Essential (primary) hypertension: Secondary | ICD-10-CM | POA: Insufficient documentation

## 2019-06-07 DIAGNOSIS — R4182 Altered mental status, unspecified: Secondary | ICD-10-CM | POA: Diagnosis not present

## 2019-06-07 DIAGNOSIS — R531 Weakness: Secondary | ICD-10-CM | POA: Diagnosis not present

## 2019-06-07 DIAGNOSIS — R410 Disorientation, unspecified: Secondary | ICD-10-CM | POA: Diagnosis not present

## 2019-06-07 DIAGNOSIS — R918 Other nonspecific abnormal finding of lung field: Secondary | ICD-10-CM | POA: Diagnosis not present

## 2019-06-07 DIAGNOSIS — J984 Other disorders of lung: Secondary | ICD-10-CM | POA: Diagnosis not present

## 2019-06-07 LAB — URINALYSIS, ROUTINE W REFLEX MICROSCOPIC
Bilirubin Urine: NEGATIVE
Glucose, UA: NEGATIVE mg/dL
Ketones, ur: NEGATIVE mg/dL
Nitrite: NEGATIVE
Protein, ur: 30 mg/dL — AB
Specific Gravity, Urine: 1.02 (ref 1.005–1.030)
pH: 7 (ref 5.0–8.0)

## 2019-06-07 LAB — CBC
HCT: 36.7 % — ABNORMAL LOW (ref 39.0–52.0)
Hemoglobin: 12 g/dL — ABNORMAL LOW (ref 13.0–17.0)
MCH: 32.3 pg (ref 26.0–34.0)
MCHC: 32.7 g/dL (ref 30.0–36.0)
MCV: 98.7 fL (ref 80.0–100.0)
Platelets: 331 10*3/uL (ref 150–400)
RBC: 3.72 MIL/uL — ABNORMAL LOW (ref 4.22–5.81)
RDW: 12.7 % (ref 11.5–15.5)
WBC: 8.6 10*3/uL (ref 4.0–10.5)
nRBC: 0 % (ref 0.0–0.2)

## 2019-06-07 LAB — BASIC METABOLIC PANEL
Anion gap: 9 (ref 5–15)
BUN: 23 mg/dL (ref 8–23)
CO2: 24 mmol/L (ref 22–32)
Calcium: 9.1 mg/dL (ref 8.9–10.3)
Chloride: 102 mmol/L (ref 98–111)
Creatinine, Ser: 1.15 mg/dL (ref 0.61–1.24)
GFR calc Af Amer: >60 mL/min (ref >60)
GFR calc non Af Amer: 53 mL/min — ABNORMAL LOW (ref >60)
Glucose, Bld: 98 mg/dL (ref 70–99)
Potassium: 4.2 mmol/L (ref 3.5–5.1)
Sodium: 135 mmol/L (ref 135–145)

## 2019-06-07 LAB — URINALYSIS, MICROSCOPIC (REFLEX): WBC, UA: >50 WBC/hpf (ref 0–5)

## 2019-06-07 MED ORDER — SODIUM CHLORIDE 0.9 % IV SOLN
1.0000 g | Freq: Once | INTRAVENOUS | Status: AC
  Administered 2019-06-07: 1 g via INTRAVENOUS
  Filled 2019-06-07: qty 10

## 2019-06-07 MED ORDER — SODIUM CHLORIDE 0.9 % IV SOLN: INTRAVENOUS | Status: DC

## 2019-06-07 MED ORDER — SODIUM CHLORIDE 0.9% FLUSH: 3.0000 mL | Freq: Once | INTRAVENOUS | Status: DC

## 2019-06-07 MED ORDER — CEPHALEXIN 500 MG PO CAPS: 500.0000 mg | ORAL_CAPSULE | Freq: Four times a day (QID) | ORAL | 0 refills | Status: DC

## 2019-06-07 NOTE — Discharge Instructions (Addendum)
Work-up tonight with evidence of a urinary tract infection.  Urine culture sent.  Given a dose of Rocephin here we will have you continue Keflex for the next 7 days at home.  Take as directed.  Would expect improvement over the next 2 days.  Return for any new or worse symptoms.  Rest of the work-up without any significant findings.

## 2019-06-07 NOTE — ED Provider Notes (Signed)
[facility] EMERGENCY DEPARTMENT Provider Note   CSN: [id number] Arrival date & time: 06/07/19  1107     History   Chief Complaint Chief Complaint  Patient presents with  . Weakness  . Altered Mental Status    HPI [name] is a 85 y.o. male.     Patient brought in by [facility].  Patient is coming from home.  Patient woke up this morning and he was feeling weak all over and family reported he was confused when he first woke up.  Upon arrival of EMS patient was alert and oriented at baseline.  EMS got a blood sugar of 98.  Patient denies any fevers cough or congestion.  Past medical history is never hypertension hyper lipidemia history of prostate cancer coronary artery stenosis.  And noncardiac chest pain.     Past Medical History:  Diagnosis Date  . Cancer [facility])    prostate  . Carotid artery stenosis   . Degenerative disc disease   . Fractures    compression  . History of prostate cancer   . Hyperlipidemia   . Hypertension   . Kyphosis   . Non-cardiac chest pain 04/11   hospital ? [facility]    Patient Active Problem List   Diagnosis Date Noted  . Goals of care, counseling/discussion   . Palliative care by specialist   . Lower extremity weakness 06/14/2019  . Weakness 06/14/2019  . Hearing loss 09/14/2018  . Thickened nails 09/14/2018  . PVD (peripheral vascular disease) ([place]) 11/05/2017  . Fall at home 09/11/2017  . Poor balance 09/11/2017  . Vitamin D deficiency 08/28/2016  . Routine general medical examination at a health care facility 08/24/2015  . B12 deficiency 08/14/2014  . Low back pain 05/02/2014  . Urinary frequency 05/02/2014  . Encounter for Medicare annual wellness exam 07/27/2013  . Motion sickness 04/04/2013  . Osteopenia 06/14/2012  . Hypothyroid 06/14/2012  . 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 FECAL OCCULT BLOOD 02/01/2010  . ANEMIA, MILD 01/23/2010  . Essential hypertension 02/23/2009  . Carotid artery stenosis 02/23/2009  .  SPONDYLOSIS, LUMBAR 02/23/2009  . NECK PAIN, CHRONIC 02/23/2009  . BACK PAIN, CHRONIC 02/23/2009  . H/O compression fracture of spine 02/23/2009  . HYPERCHOLESTEROLEMIA 02/21/2009  . History of prostate cancer 02/21/2009    Past Surgical History:  Procedure Laterality Date  . CARDIOVASCULAR STRESS TEST  2004   cardiolite negative  . carotid doppler  11/05   0-39% bilatteral  . herniated disc surgery  1990   L3-4  . [place]   right  . prostate cancer surgery    . PROSTATE SURGERY    . TONSILLECTOMY          Home Medications    Prior to Admission medications   Medication Sig Start Date End Date Taking? Authorizing Provider  Alpha-D-Galactosidase Satira Mccallum) TABS Take 1 tablet by mouth as needed (before meals, to improve digestion).   Yes [provider]  aspirin EC 81 MG EC tablet Take 81 mg by mouth 3 (three) times a week.    Yes [provider]  Cholecalciferol (VITAMIN D-3) 25 MCG (1000 UT) CAPS Take 1,000-2,000 Units by mouth daily.   Yes [provider]  guaiFENesin (MUCINEX) 600 MG 12 hr tablet Take 600 mg by mouth 2 (two) times daily as needed for to loosen phlegm.    Yes [provider]  levothyroxine (SYNTHROID, LEVOTHROID) 25 MCG tablet Take 1 tablet (25 mcg total)  by mouth daily before breakfast. 09/14/18  Yes Tower, Wynelle Fanny, MD  SELENIUM PO Take 1 tablet by mouth daily with breakfast.    Yes [provider]  vitamin B-12 (CYANOCOBALAMIN) 100 MCG tablet Take 1 tablet (100 mcg total) by mouth every other day. 09/14/18  Yes Tower, Wynelle Fanny, MD  amLODipine (NORVASC) 5 MG tablet Take 1 tablet (5 mg total) by mouth daily. 06/17/19   Shelly Coss, MD  cephALEXin (KEFLEX) 500 MG capsule Take 1 capsule (500 mg total) by mouth 4 (four) times daily. 06/07/19   Fredia Sorrow, MD  oxybutynin (DITROPAN-XL) 5 MG 24 hr tablet Take 1 tablet (5 mg total) by mouth See admin instructions. Take 5 mg by mouth every other night  06/17/19   Aline August, MD    Family History Family History  Problem Relation Age of Onset  . Hypertension Father     Social History Social History   Tobacco Use  . Smoking status: Never Smoker  . Smokeless tobacco: Never Used  Substance Use Topics  . Alcohol use: Yes    Alcohol/week: 14.0 standard drinks    Types: 14 Glasses of wine per week  . Drug use: No     Allergies   Simvastatin   Review of Systems Review of Systems  Constitutional: Negative for chills and fever.  HENT: Negative for congestion, rhinorrhea and sore throat.   Eyes: Negative for visual disturbance.  Respiratory: Negative for cough and shortness of breath.   Cardiovascular: Negative for chest pain and leg swelling.  Gastrointestinal: Negative for abdominal pain, diarrhea, nausea and vomiting.  Genitourinary: Negative for dysuria.  Musculoskeletal: Negative for back pain and neck pain.  Skin: Negative for rash and wound.  Neurological: Positive for weakness. Negative for dizziness, light-headedness and headaches.  Hematological: Does not bruise/bleed easily.  Psychiatric/Behavioral: Positive for confusion.     Physical Exam Updated Vital Signs BP (!) 139/54 (BP Location: Right Arm)   Pulse (!) 59   Temp 98.4 F (36.9 C) (Oral)   Resp 14   Ht 1.727 m (5\' 8" )   Wt 68 kg   SpO2 100%   BMI 22.81 kg/m   Physical Exam Vitals signs and nursing note reviewed.  Constitutional:      General: He is not in acute distress.    Appearance: Normal appearance. He is well-developed.  HENT:     Head: Normocephalic and atraumatic.  Eyes:     Extraocular Movements: Extraocular movements intact.     Conjunctiva/sclera: Conjunctivae normal.     Pupils: Pupils are equal, round, and reactive to light.  Neck:     Musculoskeletal: Normal range of motion and neck supple.  Cardiovascular:     Rate and Rhythm: Normal rate and regular rhythm.     Heart sounds: No murmur.  Pulmonary:     Effort:  Pulmonary effort is normal. No respiratory distress.     Breath sounds: Normal breath sounds.  Abdominal:     Palpations: Abdomen is soft.     Tenderness: There is no abdominal tenderness.  Musculoskeletal:        General: No swelling.  Skin:    General: Skin is warm and dry.     Capillary Refill: Capillary refill takes less than 2 seconds.  Neurological:     General: No focal deficit present.     Mental Status: He is alert. Mental status is at baseline.     Cranial Nerves: No cranial nerve deficit.  Sensory: No sensory deficit.     Motor: No weakness.      ED Treatments / Results  Labs (all labs ordered are listed, but only abnormal results are displayed) Labs Reviewed  URINE CULTURE - Abnormal; Notable for the following components:      Result Value   Culture MULTIPLE SPECIES PRESENT, SUGGEST RECOLLECTION (*)    All other components within normal limits  BASIC METABOLIC PANEL - Abnormal; Notable for the following components:   GFR calc non Af Amer 53 (*)    All other components within normal limits  CBC - Abnormal; Notable for the following components:   RBC 3.72 (*)    Hemoglobin 12.0 (*)    HCT 36.7 (*)    All other components within normal limits  URINALYSIS, ROUTINE W REFLEX MICROSCOPIC - Abnormal; Notable for the following components:   APPearance CLOUDY (*)    Hgb urine dipstick MODERATE (*)    Protein, ur 30 (*)    Leukocytes,Ua LARGE (*)    All other components within normal limits  URINALYSIS, MICROSCOPIC (REFLEX) - Abnormal; Notable for the following components:   Bacteria, UA MANY (*)    All other components within normal limits  CBG MONITORING, ED   Results for orders placed or performed during the hospital encounter of 06/07/19  Urine Culture   Specimen: Urine, Random  Result Value Ref Range   Specimen Description URINE, RANDOM    Special Requests      NONE Performed at [facility], [address] [place], [place] [address]    Culture  MULTIPLE SPECIES PRESENT, SUGGEST RECOLLECTION (A)    Report Status 06/08/2019 FINAL   Basic metabolic panel  Result Value Ref Range   Sodium 135 135 - 145 mmol/L   Potassium 4.2 3.5 - 5.1 mmol/L   Chloride 102 98 - 111 mmol/L   CO2 24 22 - 32 mmol/L   Glucose, Bld 98 70 - 99 mg/dL   BUN 23 8 - 23 mg/dL   Creatinine, Ser 1.15 0.61 - 1.24 mg/dL   Calcium 9.1 8.9 - 10.3 mg/dL   GFR calc non Af Amer 53 (L) >60 mL/min   GFR calc Af Amer >60 >60 mL/min   Anion gap 9 5 - 15  CBC  Result Value Ref Range   WBC 8.6 4.0 - 10.5 K/uL   RBC 3.72 (L) 4.22 - 5.81 MIL/uL   Hemoglobin 12.0 (L) 13.0 - 17.0 g/dL   HCT 36.7 (L) 39.0 - 52.0 %   MCV 98.7 80.0 - 100.0 fL   MCH 32.3 26.0 - 34.0 pg   MCHC 32.7 30.0 - 36.0 g/dL   RDW 12.7 11.5 - 15.5 %   Platelets 331 150 - 400 K/uL   nRBC 0.0 0.0 - 0.2 %  Urinalysis, Routine w reflex microscopic  Result Value Ref Range   Color, Urine YELLOW YELLOW   APPearance CLOUDY (A) CLEAR   Specific Gravity, Urine 1.020 1.005 - 1.030   pH 7.0 5.0 - 8.0   Glucose, UA NEGATIVE NEGATIVE mg/dL   Hgb urine dipstick MODERATE (A) NEGATIVE   Bilirubin Urine NEGATIVE NEGATIVE   Ketones, ur NEGATIVE NEGATIVE mg/dL   Protein, ur 30 (A) NEGATIVE mg/dL   Nitrite NEGATIVE NEGATIVE   Leukocytes,Ua LARGE (A) NEGATIVE  Urinalysis, Microscopic (reflex)  Result Value Ref Range   RBC / HPF 0-5 0 - 5 RBC/hpf   WBC, UA >50 0 - 5 WBC/hpf   Bacteria, UA MANY (  A) NONE SEEN   Squamous Epithelial / LPF 0-5 0 - 5   Urine-Other LESS THAN 10 mL OF URINE SUBMITTED      EKG EKG Interpretation  Date/Time:  Tuesday June 07 2019 11:08:35 EDT Ventricular Rate:  55 PR Interval:  228 QRS Duration: 82 QT Interval:  442 QTC Calculation: 422 R Axis:   92 Text Interpretation:  Sinus bradycardia with sinus arrhythmia with 1st degree A-V block Rightward axis Anterior infarct , age undetermined Abnormal ECG Confirmed by Fredia Sorrow [id number]) on 06/07/2019 3:21:23 PM   Radiology  No results found.  Procedures Procedures (including critical care time)  Medications Ordered in ED Medications  cefTRIAXone (ROCEPHIN) 1 g in sodium chloride 0.9 % 100 mL IVPB (0 g Intravenous Stopped 06/07/19 1714)     Initial Impression / Assessment and Plan / ED Course  I have reviewed the triage vital signs and the nursing notes.  Pertinent labs & imaging results that were available during my care of the patient were reviewed by me and considered in my medical decision making (see chart for details).        Urinalysis just developed urinary tract infection.  Patient started on the antibiotic Keflex.  Patient given 1 dose of ceftriaxone IV while in the emergency department.  Patient had a history of weakness with no significant weakness or any concern for any significant focal findings while here in the emergency department.  Feel that patient's symptoms that EMS was called out for may have been secondary to the urinary tract infection.  Patient will return for any new or worse symptoms.   Final Clinical Impressions(s) / ED Diagnoses   Final diagnoses:  Acute cystitis without hematuria  Weakness    ED Discharge Orders         Ordered    cephALEXin (KEFLEX) 500 MG capsule  4 times daily     06/07/19 2219           Fredia Sorrow, MD 06/20/19 [phone]

## 2019-06-07 NOTE — Telephone Encounter (Signed)
Agree with that advisement Aware Will watch for records

## 2019-06-07 NOTE — ED Notes (Signed)
Called CT to see time frame for patients scan. CT states the patient is next

## 2019-06-07 NOTE — ED Notes (Signed)
Called CT again about pts scan. CT states they are on the way to pick up patient for scan

## 2019-06-07 NOTE — ED Notes (Signed)
Patient verbalizes understanding of discharge instructions. Opportunity for questioning and answers were provided. Armband removed by staff, pt discharged from ED via wheelchair to home.  

## 2019-06-07 NOTE — ED Triage Notes (Signed)
Pt arrives by [facility] from Home with c/o of waking up feeling weak all over an family reported he was confused when he first woke up. Per ems on their arrival he was alert and oriented to baseline.   146/64 HR56 cbg 98 18G l a/c

## 2019-06-07 NOTE — ED Notes (Signed)
Dinner tray ordered.

## 2019-06-07 NOTE — Telephone Encounter (Signed)
[name] pts daughter (do not see on DPR) said that yesterday pt was upset,nauseated and dizzy and pt was having difficulty in walking not from the dizziness but difficulty in moving legs. [name] was not sure which leg or both. [name] has not talked with pt this morning but she will call when gets off the phone. [name] will take pt to ED in  [facility] where she lives or will call 911 to take to local hospital depending on what pt will agree to. Pt has been exposed to hospice worker for his son that hospice worker has been exposed to positive covid; hospice worker has been tested but has not gotten results yet.FYI to Dr Glori Bickers.

## 2019-06-08 LAB — URINE CULTURE

## 2019-06-14 ENCOUNTER — Encounter (HOSPITAL_COMMUNITY): Payer: Self-pay | Admitting: Emergency Medicine

## 2019-06-14 ENCOUNTER — Encounter: Payer: Self-pay | Admitting: Family Medicine

## 2019-06-14 ENCOUNTER — Emergency Department (HOSPITAL_COMMUNITY): Payer: Medicare HMO

## 2019-06-14 ENCOUNTER — Other Ambulatory Visit: Payer: Self-pay

## 2019-06-14 ENCOUNTER — Inpatient Hospital Stay (HOSPITAL_COMMUNITY)
Admission: EM | Admit: 2019-06-14 | Discharge: 2019-06-17 | DRG: 552 | Disposition: A | Discharge status: Home Health | Payer: Medicare HMO | Attending: Internal Medicine | Admitting: Internal Medicine

## 2019-06-14 DIAGNOSIS — R32 Unspecified urinary incontinence: Secondary | ICD-10-CM | POA: Diagnosis present

## 2019-06-14 DIAGNOSIS — I440 Atrioventricular block, first degree: Secondary | ICD-10-CM | POA: Diagnosis present

## 2019-06-14 DIAGNOSIS — Z23 Encounter for immunization: Secondary | ICD-10-CM

## 2019-06-14 DIAGNOSIS — E785 Hyperlipidemia, unspecified: Secondary | ICD-10-CM | POA: Diagnosis present

## 2019-06-14 DIAGNOSIS — Z515 Encounter for palliative care: Secondary | ICD-10-CM

## 2019-06-14 DIAGNOSIS — I10 Essential (primary) hypertension: Secondary | ICD-10-CM | POA: Diagnosis present

## 2019-06-14 DIAGNOSIS — D519 Vitamin B12 deficiency anemia, unspecified: Secondary | ICD-10-CM | POA: Diagnosis present

## 2019-06-14 DIAGNOSIS — R29898 Other symptoms and signs involving the musculoskeletal system: Secondary | ICD-10-CM | POA: Diagnosis present

## 2019-06-14 DIAGNOSIS — E039 Hypothyroidism, unspecified: Secondary | ICD-10-CM | POA: Diagnosis not present

## 2019-06-14 DIAGNOSIS — Z8249 Family history of ischemic heart disease and other diseases of the circulatory system: Secondary | ICD-10-CM

## 2019-06-14 DIAGNOSIS — R918 Other nonspecific abnormal finding of lung field: Secondary | ICD-10-CM | POA: Diagnosis present

## 2019-06-14 DIAGNOSIS — M545 Low back pain: Secondary | ICD-10-CM | POA: Diagnosis not present

## 2019-06-14 DIAGNOSIS — S22079A Unspecified fracture of T9-T10 vertebra, initial encounter for closed fracture: Principal | ICD-10-CM | POA: Diagnosis present

## 2019-06-14 DIAGNOSIS — Y92009 Unspecified place in unspecified non-institutional (private) residence as the place of occurrence of the external cause: Secondary | ICD-10-CM

## 2019-06-14 DIAGNOSIS — Z20828 Contact with and (suspected) exposure to other viral communicable diseases: Secondary | ICD-10-CM | POA: Diagnosis not present

## 2019-06-14 DIAGNOSIS — R531 Weakness: Secondary | ICD-10-CM | POA: Diagnosis not present

## 2019-06-14 DIAGNOSIS — Z79890 Hormone replacement therapy (postmenopausal): Secondary | ICD-10-CM

## 2019-06-14 DIAGNOSIS — Z8279 Family history of other congenital malformations, deformations and chromosomal abnormalities: Secondary | ICD-10-CM

## 2019-06-14 DIAGNOSIS — W1830XA Fall on same level, unspecified, initial encounter: Secondary | ICD-10-CM | POA: Diagnosis not present

## 2019-06-14 DIAGNOSIS — Z7189 Other specified counseling: Secondary | ICD-10-CM

## 2019-06-14 DIAGNOSIS — Z8546 Personal history of malignant neoplasm of prostate: Secondary | ICD-10-CM

## 2019-06-14 DIAGNOSIS — Z7982 Long term (current) use of aspirin: Secondary | ICD-10-CM

## 2019-06-14 DIAGNOSIS — R112 Nausea with vomiting, unspecified: Secondary | ICD-10-CM | POA: Diagnosis not present

## 2019-06-14 DIAGNOSIS — E538 Deficiency of other specified B group vitamins: Secondary | ICD-10-CM | POA: Diagnosis not present

## 2019-06-14 DIAGNOSIS — R110 Nausea: Secondary | ICD-10-CM | POA: Diagnosis not present

## 2019-06-14 DIAGNOSIS — I739 Peripheral vascular disease, unspecified: Secondary | ICD-10-CM | POA: Diagnosis present

## 2019-06-14 DIAGNOSIS — R000 Tachycardia, unspecified: Secondary | ICD-10-CM | POA: Diagnosis not present

## 2019-06-14 LAB — URINALYSIS, ROUTINE W REFLEX MICROSCOPIC
Bilirubin Urine: NEGATIVE
Glucose, UA: NEGATIVE mg/dL
Hgb urine dipstick: NEGATIVE
Ketones, ur: NEGATIVE mg/dL
Leukocytes,Ua: NEGATIVE
Nitrite: NEGATIVE
Protein, ur: 30 mg/dL — AB
Specific Gravity, Urine: 1.015 (ref 1.005–1.030)
pH: 8 (ref 5.0–8.0)

## 2019-06-14 LAB — BASIC METABOLIC PANEL
Anion gap: 10 (ref 5–15)
BUN: 28 mg/dL — ABNORMAL HIGH (ref 8–23)
CO2: 25 mmol/L (ref 22–32)
Calcium: 9.3 mg/dL (ref 8.9–10.3)
Chloride: 100 mmol/L (ref 98–111)
Creatinine, Ser: 1.16 mg/dL (ref 0.61–1.24)
GFR calc Af Amer: >60 mL/min (ref >60)
GFR calc non Af Amer: 52 mL/min — ABNORMAL LOW (ref >60)
Glucose, Bld: 113 mg/dL — ABNORMAL HIGH (ref 70–99)
Potassium: 4.3 mmol/L (ref 3.5–5.1)
Sodium: 135 mmol/L (ref 135–145)

## 2019-06-14 LAB — CBC
HCT: 34.2 % — ABNORMAL LOW (ref 39.0–52.0)
Hemoglobin: 11.8 g/dL — ABNORMAL LOW (ref 13.0–17.0)
MCH: 33.4 pg (ref 26.0–34.0)
MCHC: 34.5 g/dL (ref 30.0–36.0)
MCV: 96.9 fL (ref 80.0–100.0)
Platelets: 308 10*3/uL (ref 150–400)
RBC: 3.53 MIL/uL — ABNORMAL LOW (ref 4.22–5.81)
RDW: 12.5 % (ref 11.5–15.5)
WBC: 8.7 10*3/uL (ref 4.0–10.5)
nRBC: 0 % (ref 0.0–0.2)

## 2019-06-14 LAB — RETICULOCYTES
Immature Retic Fract: 6.1 % (ref 2.3–15.9)
RBC.: 3.62 MIL/uL — ABNORMAL LOW (ref 4.22–5.81)
Retic Count, Absolute: 44.9 10*3/uL (ref 19.0–186.0)
Retic Ct Pct: 1.2 % (ref 0.4–3.1)

## 2019-06-14 MED ORDER — ONDANSETRON HCL 4 MG/2ML IJ SOLN: 4.0000 mg | Freq: Four times a day (QID) | INTRAMUSCULAR | Status: DC | PRN

## 2019-06-14 MED ORDER — GUAIFENESIN ER 600 MG PO TB12: 600.0000 mg | ORAL_TABLET | Freq: Two times a day (BID) | ORAL | Status: DC | PRN

## 2019-06-14 MED ORDER — ASPIRIN EC 81 MG PO TBEC
81.0000 mg | DELAYED_RELEASE_TABLET | ORAL | Status: DC
  Administered 2019-06-15 – 2019-06-17 (×2): 81 mg via ORAL
  Filled 2019-06-14 (×2): qty 1

## 2019-06-14 MED ORDER — ACETAMINOPHEN 325 MG PO TABS: 650.0000 mg | ORAL_TABLET | Freq: Four times a day (QID) | ORAL | Status: DC | PRN

## 2019-06-14 MED ORDER — LEVOTHYROXINE SODIUM 25 MCG PO TABS
25.0000 ug | ORAL_TABLET | Freq: Every day | ORAL | Status: DC
  Administered 2019-06-15 – 2019-06-17 (×3): 25 ug via ORAL
  Filled 2019-06-14 (×3): qty 1

## 2019-06-14 MED ORDER — ACETAMINOPHEN 650 MG RE SUPP: 650.0000 mg | Freq: Four times a day (QID) | RECTAL | Status: DC | PRN

## 2019-06-14 MED ORDER — SODIUM CHLORIDE 0.9% FLUSH: 3.0000 mL | Freq: Once | INTRAVENOUS | Status: DC

## 2019-06-14 MED ORDER — VITAMIN B-12 100 MCG PO TABS
100.0000 ug | ORAL_TABLET | ORAL | Status: DC
  Administered 2019-06-17: 100 ug via ORAL
  Filled 2019-06-14: qty 1

## 2019-06-14 MED ORDER — ONDANSETRON HCL 4 MG PO TABS: 4.0000 mg | ORAL_TABLET | Freq: Four times a day (QID) | ORAL | Status: DC | PRN

## 2019-06-14 MED ORDER — BEANO PO TABS: 1.0000 | ORAL_TABLET | ORAL | Status: DC | PRN

## 2019-06-14 MED ORDER — OXYBUTYNIN CHLORIDE ER 5 MG PO TB24
5.0000 mg | ORAL_TABLET | ORAL | Status: DC
  Administered 2019-06-15: 5 mg via ORAL
  Filled 2019-06-14 (×2): qty 1

## 2019-06-14 NOTE — Consult Note (Addendum)
NEURO HOSPITALIST CONSULT NOTE   Requestig physician: Dr. Hal Hope  Reason for Consult: Bilateral lower extremity weakness  History obtained from:   Patient's Wife and Chart     HPI:                                                                                                                                          [name] is an 85 y.o. male who presents to the ED with a 1 day history of BLE weakness. At baseline he ambulates with a walker at home and has, per family member, about 3-4 falls per week. He was at home ambulating with a walker today when he fell backwards against a wall, was caught by a caretaker and slid to the floor. Following this, he was unable to stand up again. He stated that his legs "just gave out". There were no complaints of lateralized weakness. He has been confused with increased irritability for the past week, with acute worsening of confusion over the past 24 hours. He was recently diagnosed and treated for a UTI, also having had a CT one week ago. He has had no bowel or bladder dysfunction, as well as no back pain. No fever or chills.    Past Medical History:  Diagnosis Date  . Cancer [facility]    prostate  . Carotid artery stenosis   . Degenerative disc disease   . Fractures    compression  . History of prostate cancer   . Hyperlipidemia   . Hypertension   . Kyphosis   . Non-cardiac chest pain 04/11   hospital ? [facility]    Past Surgical History:  Procedure Laterality Date  . CARDIOVASCULAR STRESS TEST  2004   cardiolite negative  . carotid doppler  11/05   0-39% bilatteral  . herniated disc surgery  1990   L3-4  . [place]   right  . prostate cancer surgery    . PROSTATE SURGERY    . TONSILLECTOMY      Family History  Problem Relation Age of Onset  . Hypertension Father               Social History:  reports that he has never smoked. He has never used smokeless tobacco. He reports current alcohol  use of about 14.0 standard drinks of alcohol per week. He reports that he does not use drugs.  Allergies  Allergen Reactions  . Simvastatin Other (See Comments)    Aches and pains    MEDICATIONS:  No current facility-administered medications on file prior to encounter.    Current Outpatient Medications on File Prior to Encounter  Medication Sig Dispense Refill  . Alpha-D-Galactosidase (BEANO) TABS Take 1 tablet by mouth as needed (before meals, to improve digestion).    Marland Kitchen aspirin EC 81 MG EC tablet Take 81 mg by mouth 3 (three) times a week.     . cephALEXin (KEFLEX) 500 MG capsule Take 1 capsule (500 mg total) by mouth 4 (four) times daily. 28 capsule 0  . Cholecalciferol (VITAMIN D-3) 25 MCG (1000 UT) CAPS Take 1,000-2,000 Units by mouth daily.    Marland Kitchen guaiFENesin (MUCINEX) 600 MG 12 hr tablet Take 600 mg by mouth 2 (two) times daily as needed for to loosen phlegm.     Marland Kitchen levothyroxine (SYNTHROID, LEVOTHROID) 25 MCG tablet Take 1 tablet (25 mcg total) by mouth daily before breakfast. 30 tablet 11  . oxybutynin (DITROPAN-XL) 5 MG 24 hr tablet Take 1 tablet (5 mg total) by mouth at bedtime. (Patient taking differently: Take 5 mg by mouth See admin instructions. Take 5 mg by mouth every other night) 30 tablet 10  . SELENIUM PO Take 1 tablet by mouth daily with breakfast.     . vitamin B-12 (CYANOCOBALAMIN) 100 MCG tablet Take 1 tablet (100 mcg total) by mouth every other day. 1 tablet 0     ROS:                                                                                                                                       As per HPI. Comprehensive ROS otherwise negative.    Blood pressure (!) 187/79, pulse 72, temperature (!) 97.5 F (36.4 C), temperature source Oral, resp. rate 19, SpO2 95 %.   General Examination:                                                                                                        Physical Exam  HEENT-  [place]/AT    Lungs- Respirations unlabored Extremities- Warm and well perfused  Neurological Examination Mental Status: Awake and alert. Speech fluent. Able to follow all commands. Perseverates at times.  Cranial Nerves: II:  Visual fields intact with no extinction to DSS. PERRL.   III,IV, VI: No ptosis. EOMI.  V,VII: Face symmetric, facial temp sensation equal bilaterally VIII: hearing intact to voice IX,X: No hypophonia XI: Symmetric XII: midline tongue extension Motor: BUE 4+/5 BLE 4/5 proximally and distally. Requires coaching and repeated  commands to perform several components of the lower extremity motor exam. No asymmetry noted.  Sensory: Temp and light touch intact in all 4 extremities without asymmetry.  Deep Tendon Reflexes: 2+ bilateral brachioradialis and biceps. 4+ low amplitude left patellar with crossed adductor response. 1+ right patellar. 0 achilles bilaterally. Toes equivocal bilaterally.  Cerebellar: No ataxia with FNF bilaterally Gait: Patient unable to ambulate   Lab Results: Basic Metabolic Panel: Recent Labs  Lab 06/14/19 1719  NA 135  K 4.3  CL 100  CO2 25  GLUCOSE 113*  BUN 28*  CREATININE 1.16  CALCIUM 9.3    CBC: Recent Labs  Lab 06/14/19 1719  WBC 8.7  HGB 11.8*  HCT 34.2*  MCV 96.9  PLT 308    Cardiac Enzymes: No results for input(s): CKTOTAL, CKMB, CKMBINDEX, TROPONINI in the last 168 hours.  Lipid Panel: No results for input(s): CHOL, TRIG, HDL, CHOLHDL, VLDL, LDLCALC in the last 168 hours.  Imaging: Dg Chest 2 View  Result Date: 06/14/2019 CLINICAL DATA:  85 year old presenting with generalized weakness. EXAM: CHEST - 2 VIEW COMPARISON:  06/07/2019 and earlier. FINDINGS: Suboptimal inspiration accounts for crowded bronchovascular markings, especially in the bases, and accentuates the cardiac silhouette. Taking this into account, cardiac silhouette upper normal  in size, unchanged. Streaky and patchy airspace opacities in the lung bases, LEFT greater than RIGHT, new since the prior examination. Nodular opacities projecting over the RIGHT lung, unchanged. No visible pleural effusions. IMPRESSION: 1. Suboptimal inspiration. Atelectasis and/or bronchopneumonia involving the lung bases, LEFT greater than RIGHT. 2. Nodular opacities projecting over the RIGHT lung as seen on the most recent prior examination. Electronically Signed   By: Evangeline Dakin M.D.   On: 06/14/2019 19:40   Dg Lumbar Spine Complete  Result Date: 06/14/2019 CLINICAL DATA:  85 year old presenting with generalized weakness and low back pain radiating into both LOWER extremities that began yesterday. EXAM: LUMBAR SPINE - COMPLETE 4+ VIEW COMPARISON:  05/02/2014. FINDINGS: Five non-rib-bearing lumbar vertebrae with anatomic posterior alignment. No acute fractures. Remote compression fractures of T12 and L1, unchanged since the prior examination. Severe disc space narrowing at L4-5, moderate disc space narrowing at L1-2 and L2-3, and mild disc space narrowing at L3-4; the narrowing at L4-5 has progressed since the prior examination, though the remaining levels are stable. Diffuse facet degenerative changes. No pars defects. DISH involving the lower thoracic spine. Aorto-iliac atherosclerosis without evidence of aneurysm. IMPRESSION: 1. No acute osseous abnormality. 2. Stable remote compression fractures of T12 and L1. 3. Multilevel degenerative disc disease in spondylosis, worst at L4-5. The degenerative disc disease at L4-5 has worsened since 2015. The degenerative disc disease at the other levels is stable. Electronically Signed   By: Evangeline Dakin M.D.   On: 06/14/2019 19:37   Ct Head Wo Contrast  Result Date: 06/14/2019 CLINICAL DATA:  Bilateral leg weakness. EXAM: CT HEAD WITHOUT CONTRAST TECHNIQUE: Contiguous axial images were obtained from the base of the skull through the vertex without  intravenous contrast. COMPARISON:  CT head dated June 07, 2019. FINDINGS: Brain: No evidence of acute infarction, hemorrhage, hydrocephalus, extra-axial collection or mass lesion/mass effect. Stable moderate atrophy and chronic microvascular ischemic changes. Vascular: Atherosclerotic vascular calcification of the carotid siphons. No hyperdense vessel. Skull: Negative for fracture or focal lesion. Sinuses/Orbits: No acute finding. Other: None. IMPRESSION: 1.  No acute intracranial abnormality. 2. Stable moderate atrophy and chronic microvascular ischemic changes. Electronically Signed   By: Titus Dubin M.D.   On: 06/14/2019 20:11  Assessment: 85 year old male with new onset of inability to ambulate 1. Exam shows some weakness of BLE but unclear to what degree this is effort-dependent or due to diffuse fatiguability, given that diffuse mild weakness of BUE is also seen on exam. Although there is a crossed adductor response when eliciting left patellar reflex, no other definite UMN findings are seen on exam. Sensation is also intact.  2. Most likely etiology for his gait deficit is multifactorial, including chronic deconditioning on pre-existing gait dysfunction (uses a walker at home), possible volume depletion (has elevated BUN/Cr ratio and tongue appears poorly hydrated) as well as recent UTI. A cord lesion is felt to be unlikely, but can be ruled out with MRI of lumbar and thoracic spine.  3. Given his history of B12 deficiency subacute combined degeneration is also on the DDx. He is supplemented orally with B12 at home.  4. CT head showed stable moderate atrophy and chronic microvascular ischemic changes. No acute findings noted.  5. X-ray lumbar spine: No acute osseous abnormality. Stable remote compression fractures of T12 and L1. Multilevel degenerative disc disease in spondylosis, worst at L4-5. The degenerative disc disease at L4-5 has worsened since 2015. The degenerative disc disease at  the other levels is stable.  Recommendations: 1. PT/OT 2. MRI of thoracic and lumbar spine 3. Repeat B12 level and MMA level are being ordered by Hospitalist.    Electronically signed: Dr. Kerney Elbe 06/14/2019, 9:09 PM

## 2019-06-14 NOTE — H&P (Addendum)
History and Physical    [name] [id number] DOB: 10/28/1919 DOA: 06/14/2019  PCP: Abner Greenspan, MD  Patient coming from: Home.  History obtained from patient's daughter.  Chief Complaint: Lower extremity weakness.  HPI: [name] is a 85 y.o. male with history of prostate cancer in remission, hypothyroidism, B12 deficiency anemia on supplements, hypertension presently not on medication, urinary incontinence on oxybutynin had come to the ER about a week ago after patient was feeling weak and at that time patient was diagnosed with UTI and discharged home on antibiotics.  Per patient's daughter patient did well after taking antibiotics regained his strength.  Today while walking with the help of walker patient had a fall and was helped onto the floor by the healthcare worker.  Following which patient was finding it too weak to stand back on his legs.  Patient was brought to the ER.  Did not have any incontinence of urine or bowel or any pain.  Did not hit his head or lose consciousness.  ED Course: In the ER x-ray of the lumbar spine was not showing anything acute did show chronic changes CT head is unremarkable.  EKG shows first-degree AV block.  Labs show hemoglobin of 11.8 UA is unremarkable.  On-call neurologist Dr. Cheral Marker was consulted.  At this time Dr. Cheral Marker requested MRI of the T-spine and L-spine and admitted for further work-up.  My exam patient is able to move all extremities.  Is oriented to his name and place.  COVID-19 test is pending.  Chest x-ray was showing nonspecific finding including possibility of bronchopneumonia though patient has no cough or no leukocytosis and afebrile.  Review of Systems: As per HPI, rest all negative.   Past Medical History:  Diagnosis Date   Cancer [facility])    prostate   Carotid artery stenosis    Degenerative disc disease    Fractures    compression   History of prostate cancer    Hyperlipidemia    Hypertension     Kyphosis    Non-cardiac chest pain 04/11   hospital ? [facility]    Past Surgical History:  Procedure Laterality Date   CARDIOVASCULAR STRESS TEST  2004   cardiolite negative   carotid doppler  11/05   0-39% bilatteral   herniated disc surgery  1990   L3-4   INGUINAL HERNIA REPAIR  1979   right   prostate cancer surgery     PROSTATE SURGERY     TONSILLECTOMY       reports that he has never smoked. He has never used smokeless tobacco. He reports current alcohol use of about 14.0 standard drinks of alcohol per week. He reports that he does not use drugs.  Allergies  Allergen Reactions   Simvastatin Other (See Comments)    Aches and pains    Family History  Problem Relation Age of Onset   Hypertension Father     Prior to Admission medications   Medication Sig Start Date End Date Taking? Authorizing Provider  Alpha-D-Galactosidase Satira Mccallum) TABS Take 1 tablet by mouth as needed (before meals, to improve digestion).   Yes [provider]  aspirin EC 81 MG EC tablet Take 81 mg by mouth 3 (three) times a week.    Yes [provider]  cephALEXin (KEFLEX) 500 MG capsule Take 1 capsule (500 mg total) by mouth 4 (four) times daily. 06/07/19  Yes Fredia Sorrow, MD  Cholecalciferol (VITAMIN D-3) 25 MCG (1000 UT) CAPS Take  1,000-2,000 Units by mouth daily.   Yes [provider]  guaiFENesin (MUCINEX) 600 MG 12 hr tablet Take 600 mg by mouth 2 (two) times daily as needed for to loosen phlegm.    Yes [provider]  levothyroxine (SYNTHROID, LEVOTHROID) 25 MCG tablet Take 1 tablet (25 mcg total) by mouth daily before breakfast. 09/14/18  Yes Tower, Wynelle Fanny, MD  oxybutynin (DITROPAN-XL) 5 MG 24 hr tablet Take 1 tablet (5 mg total) by mouth at bedtime. Patient taking differently: Take 5 mg by mouth See admin instructions. Take 5 mg by mouth every other night 10/14/18  Yes Tower, Wynelle Fanny, MD  SELENIUM PO Take 1 tablet by mouth daily with breakfast.    Yes  [provider]  vitamin B-12 (CYANOCOBALAMIN) 100 MCG tablet Take 1 tablet (100 mcg total) by mouth every other day. 09/14/18  Yes Tower, Wynelle Fanny, MD    Physical Exam: Constitutional: Moderately built and nourished. Vitals:   06/14/19 1708 06/14/19 1825 06/14/19 1900 06/14/19 2200  BP: (!) 161/78  (!) 187/79 (!) 153/64  Pulse: 64 68 72 64  Resp: 15 14 19 16   Temp: 97.9 F (36.6 C) (!) 97.5 F (36.4 C)    TempSrc: Oral Oral    SpO2: 98% 99% 95% 98%   Eyes: Anicteric no pallor. ENMT: No discharge from the ears eyes nose or mouth. Neck: No mass or.  No neck rigidity. Respiratory: No rhonchi or crepitations. Cardiovascular: S1-S2 heard. Abdomen: Soft nontender bowel sounds present. Musculoskeletal: No edema. Skin: No rash. Neurologic: Alert awake oriented to his name and place.  Moves all extremities. Psychiatric: Oriented to his name and place.   Labs on Admission: I have personally reviewed following labs and imaging studies  CBC: Recent Labs  Lab 06/14/19 1719  WBC 8.7  HGB 11.8*  HCT 34.2*  MCV 96.9  PLT [id number]   Basic Metabolic Panel: Recent Labs  Lab 06/14/19 1719  NA 135  K 4.3  CL 100  CO2 25  GLUCOSE 113*  BUN 28*  CREATININE 1.16  CALCIUM 9.3   GFR: Estimated Creatinine Clearance: 35 mL/min (by C-G formula based on SCr of 1.16 mg/dL). Liver Function Tests: No results for input(s): AST, ALT, ALKPHOS, BILITOT, PROT, ALBUMIN in the last 168 hours. No results for input(s): LIPASE, AMYLASE in the last 168 hours. No results for input(s): AMMONIA in the last 168 hours. Coagulation Profile: No results for input(s): INR, PROTIME in the last 168 hours. Cardiac Enzymes: No results for input(s): CKTOTAL, CKMB, CKMBINDEX, TROPONINI in the last 168 hours. BNP (last 3 results) No results for input(s): PROBNP in the last 8760 hours. HbA1C: No results for input(s): HGBA1C in the last 72 hours. CBG: No results for input(s): GLUCAP in the last 168  hours. Lipid Profile: No results for input(s): CHOL, HDL, LDLCALC, TRIG, CHOLHDL, LDLDIRECT in the last 72 hours. Thyroid Function Tests: No results for input(s): TSH, T4TOTAL, FREET4, T3FREE, THYROIDAB in the last 72 hours. Anemia Panel: No results for input(s): VITAMINB12, FOLATE, FERRITIN, TIBC, IRON, RETICCTPCT in the last 72 hours. Urine analysis:    Component Value Date/Time   COLORURINE YELLOW 06/14/2019 1852   APPEARANCEUR CLOUDY (A) 06/14/2019 1852   LABSPEC 1.015 06/14/2019 1852   PHURINE 8.0 06/14/2019 1852   GLUCOSEU NEGATIVE 06/14/2019 1852   HGBUR NEGATIVE 06/14/2019 1852   HGBUR negative 01/16/2010 1150   BILIRUBINUR NEGATIVE 06/14/2019 1852   BILIRUBINUR Negative 03/15/2018 [place] 06/14/2019 1852  PROTEINUR 30 (A) 06/14/2019 1852   UROBILINOGEN 0.2 03/15/2018 1557   UROBILINOGEN 0.2 02/02/2010 1020   NITRITE NEGATIVE 06/14/2019 1852   LEUKOCYTESUR NEGATIVE 06/14/2019 1852   Sepsis Labs: @LABRCNTIP (procalcitonin:4,lacticidven:4) ) Recent Results (from the past 240 hour(s))  Urine Culture     Status: Abnormal   Collection Time: 06/07/19  4:28 PM   Specimen: Urine, Random  Result Value Ref Range Status   Specimen Description URINE, RANDOM  Final   Special Requests   Final    NONE Performed at [facility], [place] [address] [place],  [address]    Culture MULTIPLE SPECIES PRESENT, SUGGEST RECOLLECTION (A)  Final   Report Status 06/08/2019 FINAL  Final     Radiological Exams on Admission: Dg Chest 2 View  Result Date: 06/14/2019 CLINICAL DATA:  85 year old presenting with generalized weakness. EXAM: CHEST - 2 VIEW COMPARISON:  06/07/2019 and earlier. FINDINGS: Suboptimal inspiration accounts for crowded bronchovascular markings, especially in the bases, and accentuates the cardiac silhouette. Taking this into account, cardiac silhouette upper normal in size, unchanged. Streaky and patchy airspace opacities in the lung bases, LEFT  greater than RIGHT, new since the prior examination. Nodular opacities projecting over the RIGHT lung, unchanged. No visible pleural effusions. IMPRESSION: 1. Suboptimal inspiration. Atelectasis and/or bronchopneumonia involving the lung bases, LEFT greater than RIGHT. 2. Nodular opacities projecting over the RIGHT lung as seen on the most recent prior examination. Electronically Signed   By: Evangeline Dakin M.D.   On: 06/14/2019 19:40   Dg Lumbar Spine Complete  Result Date: 06/14/2019 CLINICAL DATA:  85 year old presenting with generalized weakness and low back pain radiating into both LOWER extremities that began yesterday. EXAM: LUMBAR SPINE - COMPLETE 4+ VIEW COMPARISON:  05/02/2014. FINDINGS: Five non-rib-bearing lumbar vertebrae with anatomic posterior alignment. No acute fractures. Remote compression fractures of T12 and L1, unchanged since the prior examination. Severe disc space narrowing at L4-5, moderate disc space narrowing at L1-2 and L2-3, and mild disc space narrowing at L3-4; the narrowing at L4-5 has progressed since the prior examination, though the remaining levels are stable. Diffuse facet degenerative changes. No pars defects. DISH involving the lower thoracic spine. Aorto-iliac atherosclerosis without evidence of aneurysm. IMPRESSION: 1. No acute osseous abnormality. 2. Stable remote compression fractures of T12 and L1. 3. Multilevel degenerative disc disease in spondylosis, worst at L4-5. The degenerative disc disease at L4-5 has worsened since 2015. The degenerative disc disease at the other levels is stable. Electronically Signed   By: Evangeline Dakin M.D.   On: 06/14/2019 19:37   Ct Head Wo Contrast  Result Date: 06/14/2019 CLINICAL DATA:  Bilateral leg weakness. EXAM: CT HEAD WITHOUT CONTRAST TECHNIQUE: Contiguous axial images were obtained from the base of the skull through the vertex without intravenous contrast. COMPARISON:  CT head dated June 07, 2019. FINDINGS: Brain:  No evidence of acute infarction, hemorrhage, hydrocephalus, extra-axial collection or mass lesion/mass effect. Stable moderate atrophy and chronic microvascular ischemic changes. Vascular: Atherosclerotic vascular calcification of the carotid siphons. No hyperdense vessel. Skull: Negative for fracture or focal lesion. Sinuses/Orbits: No acute finding. Other: None. IMPRESSION: 1.  No acute intracranial abnormality. 2. Stable moderate atrophy and chronic microvascular ischemic changes. Electronically Signed   By: Titus Dubin M.D.   On: 06/14/2019 20:11    EKG: Independently reviewed.  First-degree AV block.  Assessment/Plan Principal Problem:   Lower extremity weakness Active Problems:   History of prostate cancer   Hypothyroid   B12 deficiency  PVD (peripheral vascular disease) (HCC)   Weakness    1. Bilateral lower extremity weakness -cause not clear.  Patient neurology consult.  Discussed with neurologist at this time neurologist recommended getting MRI of the T-spine L-spine we will also check B12 and TSH. 2. Hypothyroidism on Synthroid. 3. B12 deficiency on B12 supplements. 4. History of prostate cancer in remission. 5. History of hypertension presently not on any medication we will keep patient on PRN IV hydralazine. 6. Abnormal chest x-ray showing possibility of pneumonia.  For now I will keep patient on doxycycline.  If procalcitonin is negative may discontinue antibiotic.  Patient has no cough no fever no leukocytosis.  Addendum -radiologist called me with the results of MRI T-spine.  T-spine MRI shows - Acute/subacute fracture of the T9 vertebral body through the anterior bridging osteophyte and extending to the posterior inferior vertebral body cortex with fluid within the anterior fracture cleft. Prevertebral soft tissue swelling and small hematoma. No retropulsion of fragments. The posterior ligamentous complex isintact and no cord stenosis.  I discussed the results with Dr.  Cheral Marker on-call neurologist.  Dr. Cheral Marker reviewed the MRI and advised getting neurosurgery consult in the morning.  Advised patient to be at bedrest.  DVT prophylaxis: SCDs Code Status: Full code for now.  But patient's daughter states that they will revise it once they come and see the patient and after discussing with patient's wife. Family Communication: Patient's daughter. Disposition Plan: To be determined. Consults called: Neurology. Admission status: Observation.   Rise Patience MD [facility] Pager [phone].  If 7PM-7AM, please contact night-coverage [email or web address] Password [email or web address]  06/14/2019, 11:27 PM

## 2019-06-14 NOTE — ED Provider Notes (Signed)
[facility] EMERGENCY DEPARTMENT Provider Note   CSN: [id number] Arrival date & time: 06/14/19  1706     History   Chief Complaint Chief Complaint  Patient presents with  . Weakness    HPI [name] is a 85 y.o. male.     85 year old male presents with weakness confined to his lower extremities x1 day.  States that when he was trying to stand up his legs just gave out.  He does use a walker normally.  Denies any bowel or bladder dysfunction.  No back pain.  Seen here recently and treated for UTI and had a head CT a week ago.  Daughter is noted that patient has been more irritable but denies any profound confusion.  No fever or chills.  No weakness in his arms.  No prior history of same and no treatment used for this prior to arrival     Past Medical History:  Diagnosis Date  . Cancer [facility])    prostate  . Carotid artery stenosis   . Degenerative disc disease   . Fractures    compression  . History of prostate cancer   . Hyperlipidemia   . Hypertension   . Kyphosis   . Non-cardiac chest pain 04/11   hospital ? [facility]    Patient Active Problem List   Diagnosis Date Noted  . Hearing loss 09/14/2018  . Thickened nails 09/14/2018  . PVD (peripheral vascular disease) ([place]) 11/05/2017  . Fall at home 09/11/2017  . Poor balance 09/11/2017  . Vitamin D deficiency 08/28/2016  . Routine general medical examination at a health care facility 08/24/2015  . B12 deficiency 08/14/2014  . Low back pain 05/02/2014  . Urinary frequency 05/02/2014  . Encounter for Medicare annual wellness exam 07/27/2013  . Motion sickness 04/04/2013  . Osteopenia 06/14/2012  . Hypothyroid 06/14/2012  . 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 FECAL OCCULT BLOOD 02/01/2010  . ANEMIA, MILD 01/23/2010  . Essential hypertension 02/23/2009  . Carotid artery stenosis 02/23/2009  . SPONDYLOSIS, LUMBAR 02/23/2009  . NECK PAIN, CHRONIC 02/23/2009  . BACK PAIN, CHRONIC 02/23/2009  . H/O compression  fracture of spine 02/23/2009  . HYPERCHOLESTEROLEMIA 02/21/2009  . History of prostate cancer 02/21/2009    Past Surgical History:  Procedure Laterality Date  . CARDIOVASCULAR STRESS TEST  2004   cardiolite negative  . carotid doppler  11/05   0-39% bilatteral  . herniated disc surgery  1990   L3-4  . [place]   right  . prostate cancer surgery    . PROSTATE SURGERY    . TONSILLECTOMY          Home Medications    Prior to Admission medications   Medication Sig Start Date End Date Taking? Authorizing Provider  Alpha-D-Galactosidase Satira Mccallum) TABS Take 1 tablet by mouth as needed (before meals, to improve digestion).    [provider]  aspirin EC 81 MG EC tablet Take 81 mg by mouth 3 (three) times a week.     [provider]  cephALEXin (KEFLEX) 500 MG capsule Take 1 capsule (500 mg total) by mouth 4 (four) times daily. 06/07/19   Fredia Sorrow, MD  Cholecalciferol (VITAMIN D-3) 25 MCG (1000 UT) CAPS Take 1,000-2,000 Units by mouth daily.    [provider]  guaiFENesin (MUCINEX) 600 MG 12 hr tablet Take 600 mg by mouth 2 (two) times daily as needed for to loosen phlegm.     [provider]  levothyroxine (SYNTHROID, LEVOTHROID) 25 MCG tablet Take 1 tablet (25 mcg total) by mouth daily before breakfast. 09/14/18   Tower, Wynelle Fanny, MD  oxybutynin (DITROPAN-XL) 5 MG 24 hr tablet Take 1 tablet (5 mg total) by mouth at bedtime. Patient taking differently: Take 5 mg by mouth See admin instructions. Take 5 mg by mouth every other night 10/14/18   Tower, Wynelle Fanny, MD  SELENIUM PO Take 1 tablet by mouth daily with breakfast.     [provider]  vitamin B-12 (CYANOCOBALAMIN) 100 MCG tablet Take 1 tablet (100 mcg total) by mouth every other day. 09/14/18   Tower, Wynelle Fanny, MD    Family History Family History  Problem Relation Age of Onset  . Hypertension Father     Social History Social History   Tobacco Use  . Smoking  status: Never Smoker  . Smokeless tobacco: Never Used  Substance Use Topics  . Alcohol use: Yes    Alcohol/week: 14.0 standard drinks    Types: 14 Glasses of wine per week  . Drug use: No     Allergies   Simvastatin   Review of Systems Review of Systems  All other systems reviewed and are negative.    Physical Exam Updated Vital Signs BP (!) 161/78   Pulse 68   Temp (!) 97.5 F (36.4 C) (Oral)   Resp 14   SpO2 99%   Physical Exam Vitals signs and nursing note reviewed.  Constitutional:      General: He is not in acute distress.    Appearance: Normal appearance. He is well-developed. He is not toxic-appearing.  HENT:     Head: Normocephalic and atraumatic.  Eyes:     General: Lids are normal.     Conjunctiva/sclera: Conjunctivae normal.     Pupils: Pupils are equal, round, and reactive to light.  Neck:     Musculoskeletal: Normal range of motion and neck supple.     Thyroid: No thyroid mass.     Trachea: No tracheal deviation.  Cardiovascular:     Rate and Rhythm: Normal rate and regular rhythm.     Heart sounds: Normal heart sounds. No murmur. No gallop.   Pulmonary:     Effort: Pulmonary effort is normal. No respiratory distress.     Breath sounds: Normal breath sounds. No stridor. No decreased breath sounds, wheezing, rhonchi or rales.  Abdominal:     General: Bowel sounds are normal. There is no distension.     Palpations: Abdomen is soft.     Tenderness: There is no abdominal tenderness. There is no rebound.  Musculoskeletal: Normal range of motion.        General: No tenderness.     Lumbar back: He exhibits no tenderness and no bony tenderness.  Skin:    General: Skin is warm and dry.     Findings: No abrasion or rash.  Neurological:     Mental Status: He is alert and oriented to person, place, and time.     GCS: GCS eye subscore is 4. GCS verbal subscore is 5. GCS motor subscore is 6.     Cranial Nerves: No cranial nerve deficit.     Sensory: No  sensory deficit.     Motor: No weakness or tremor.     Deep Tendon Reflexes:     Reflex Scores:      Patellar reflexes are 2+ on the right side and 2+ on the left side.    Comments: Strength is 5  of 5 in upper as well as lower extremities.  Psychiatric:        Speech: Speech normal.        Behavior: Behavior normal.      ED Treatments / Results  Labs (all labs ordered are listed, but only abnormal results are displayed) Labs Reviewed  BASIC METABOLIC PANEL - Abnormal; Notable for the following components:      Result Value   Glucose, Bld 113 (*)    BUN 28 (*)    GFR calc non Af Amer 52 (*)    All other components within normal limits  CBC - Abnormal; Notable for the following components:   RBC 3.53 (*)    Hemoglobin 11.8 (*)    HCT 34.2 (*)    All other components within normal limits  URINALYSIS, ROUTINE W REFLEX MICROSCOPIC  CBG MONITORING, ED    EKG EKG Interpretation  Date/Time:  Tuesday June 14 2019 17:15:42 EDT Ventricular Rate:  65 PR Interval:  232 QRS Duration: 80 QT Interval:  410 QTC Calculation: 426 R Axis:   119 Text Interpretation:  Sinus rhythm with 1st degree A-V block Right axis deviation Abnormal ECG No significant change since last tracing Confirmed by Lacretia Leigh ([id number]) on 06/14/2019 6:42:01 PM   Radiology No results found.  Procedures Procedures (including critical care time)  Medications Ordered in ED Medications  sodium chloride flush (NS) 0.9 % injection 3 mL (has no administration in time range)     Initial Impression / Assessment and Plan / ED Course  I have reviewed the triage vital signs and the nursing notes.  Pertinent labs & imaging results that were available during my care of the patient were reviewed by me and considered in my medical decision making (see chart for details).        Patient is urinalysis without infection.  Plain x-rays of his LS spine without fracture.  Head CT negative as well 2.  Patient seen  by neurology who feels that patient will require inpatient work-up for evaluation of his weakness.  Patient has no signs of pneumonia at this time despite with his chest x-ray chest.  He has not had any cough or congestion no fever or chills.  His weakness is been confined to his lower extremities.  Will consult hospitalist for admission Final Clinical Impressions(s) / ED Diagnoses   Final diagnoses:  None    ED Discharge Orders    None       Lacretia Leigh, MD 06/14/19 [phone]

## 2019-06-14 NOTE — ED Notes (Signed)
Urine culture collected and sent down to main lab 

## 2019-06-14 NOTE — ED Triage Notes (Signed)
Pt arrives via EMS from home with reports of weakness in bilateral legs starting around lunch today. EMS reports A&Ox4 and seen here for UTI last week. EMS reports 1st degree HB on 12 lead and HTN

## 2019-06-15 ENCOUNTER — Ambulatory Visit: Payer: Medicare HMO | Admitting: Family Medicine

## 2019-06-15 ENCOUNTER — Encounter (HOSPITAL_COMMUNITY): Payer: Self-pay | Admitting: *Deleted

## 2019-06-15 ENCOUNTER — Observation Stay (HOSPITAL_COMMUNITY): Payer: Medicare HMO

## 2019-06-15 DIAGNOSIS — Z8279 Family history of other congenital malformations, deformations and chromosomal abnormalities: Secondary | ICD-10-CM | POA: Diagnosis not present

## 2019-06-15 DIAGNOSIS — Z7982 Long term (current) use of aspirin: Secondary | ICD-10-CM | POA: Diagnosis not present

## 2019-06-15 DIAGNOSIS — Y92009 Unspecified place in unspecified non-institutional (private) residence as the place of occurrence of the external cause: Secondary | ICD-10-CM | POA: Diagnosis not present

## 2019-06-15 DIAGNOSIS — M48061 Spinal stenosis, lumbar region without neurogenic claudication: Secondary | ICD-10-CM | POA: Diagnosis not present

## 2019-06-15 DIAGNOSIS — Z8546 Personal history of malignant neoplasm of prostate: Secondary | ICD-10-CM

## 2019-06-15 DIAGNOSIS — Z20828 Contact with and (suspected) exposure to other viral communicable diseases: Secondary | ICD-10-CM | POA: Diagnosis not present

## 2019-06-15 DIAGNOSIS — M47814 Spondylosis without myelopathy or radiculopathy, thoracic region: Secondary | ICD-10-CM | POA: Diagnosis not present

## 2019-06-15 DIAGNOSIS — S22079A Unspecified fracture of T9-T10 vertebra, initial encounter for closed fracture: Secondary | ICD-10-CM | POA: Diagnosis not present

## 2019-06-15 DIAGNOSIS — Z7189 Other specified counseling: Secondary | ICD-10-CM | POA: Diagnosis not present

## 2019-06-15 DIAGNOSIS — M47816 Spondylosis without myelopathy or radiculopathy, lumbar region: Secondary | ICD-10-CM | POA: Diagnosis not present

## 2019-06-15 DIAGNOSIS — D519 Vitamin B12 deficiency anemia, unspecified: Secondary | ICD-10-CM | POA: Diagnosis present

## 2019-06-15 DIAGNOSIS — E039 Hypothyroidism, unspecified: Secondary | ICD-10-CM | POA: Diagnosis not present

## 2019-06-15 DIAGNOSIS — Z515 Encounter for palliative care: Secondary | ICD-10-CM | POA: Diagnosis not present

## 2019-06-15 DIAGNOSIS — W1830XA Fall on same level, unspecified, initial encounter: Secondary | ICD-10-CM | POA: Diagnosis not present

## 2019-06-15 DIAGNOSIS — S22071A Stable burst fracture of T9-T10 vertebra, initial encounter for closed fracture: Secondary | ICD-10-CM | POA: Diagnosis not present

## 2019-06-15 DIAGNOSIS — Z23 Encounter for immunization: Secondary | ICD-10-CM | POA: Diagnosis not present

## 2019-06-15 DIAGNOSIS — S22079B Unspecified fracture of T9-T10 vertebra, initial encounter for open fracture: Secondary | ICD-10-CM | POA: Diagnosis not present

## 2019-06-15 DIAGNOSIS — M2550 Pain in unspecified joint: Secondary | ICD-10-CM | POA: Diagnosis not present

## 2019-06-15 DIAGNOSIS — R29898 Other symptoms and signs involving the musculoskeletal system: Secondary | ICD-10-CM | POA: Diagnosis not present

## 2019-06-15 DIAGNOSIS — I959 Hypotension, unspecified: Secondary | ICD-10-CM | POA: Diagnosis not present

## 2019-06-15 DIAGNOSIS — Z79890 Hormone replacement therapy (postmenopausal): Secondary | ICD-10-CM | POA: Diagnosis not present

## 2019-06-15 DIAGNOSIS — I469 Cardiac arrest, cause unspecified: Secondary | ICD-10-CM | POA: Diagnosis not present

## 2019-06-15 DIAGNOSIS — E785 Hyperlipidemia, unspecified: Secondary | ICD-10-CM | POA: Diagnosis not present

## 2019-06-15 DIAGNOSIS — R918 Other nonspecific abnormal finding of lung field: Secondary | ICD-10-CM | POA: Diagnosis not present

## 2019-06-15 DIAGNOSIS — I440 Atrioventricular block, first degree: Secondary | ICD-10-CM | POA: Diagnosis present

## 2019-06-15 DIAGNOSIS — E538 Deficiency of other specified B group vitamins: Secondary | ICD-10-CM | POA: Diagnosis not present

## 2019-06-15 DIAGNOSIS — R32 Unspecified urinary incontinence: Secondary | ICD-10-CM | POA: Diagnosis present

## 2019-06-15 DIAGNOSIS — I10 Essential (primary) hypertension: Secondary | ICD-10-CM | POA: Diagnosis not present

## 2019-06-15 DIAGNOSIS — I739 Peripheral vascular disease, unspecified: Secondary | ICD-10-CM | POA: Diagnosis not present

## 2019-06-15 DIAGNOSIS — R531 Weakness: Secondary | ICD-10-CM | POA: Diagnosis not present

## 2019-06-15 DIAGNOSIS — Z8249 Family history of ischemic heart disease and other diseases of the circulatory system: Secondary | ICD-10-CM | POA: Diagnosis not present

## 2019-06-15 DIAGNOSIS — Z7401 Bed confinement status: Secondary | ICD-10-CM | POA: Diagnosis not present

## 2019-06-15 LAB — SARS CORONAVIRUS 2 (TAT 6-24 HRS): SARS Coronavirus 2: NEGATIVE

## 2019-06-15 LAB — IRON AND TIBC
Iron: 58 ug/dL (ref 45–182)
Saturation Ratios: 21 % (ref 17.9–39.5)
TIBC: 283 ug/dL (ref 250–450)
UIBC: 225 ug/dL

## 2019-06-15 LAB — PROCALCITONIN: Procalcitonin: <0.1 ng/mL

## 2019-06-15 LAB — FOLATE: Folate: 26.3 ng/mL (ref >5.9)

## 2019-06-15 LAB — VITAMIN B12: Vitamin B-12: 1445 pg/mL — ABNORMAL HIGH (ref 180–914)

## 2019-06-15 LAB — TSH: TSH: 2.061 u[IU]/mL (ref 0.350–4.500)

## 2019-06-15 LAB — FERRITIN: Ferritin: 131 ng/mL (ref 24–336)

## 2019-06-15 MED ORDER — ENSURE ENLIVE PO LIQD
237.0000 mL | Freq: Two times a day (BID) | ORAL | Status: DC
  Administered 2019-06-16 – 2019-06-17 (×3): 237 mL via ORAL

## 2019-06-15 MED ORDER — SODIUM CHLORIDE 0.9 % IV SOLN
100.0000 mg | Freq: Two times a day (BID) | INTRAVENOUS | Status: DC
  Administered 2019-06-15: 100 mg via INTRAVENOUS
  Filled 2019-06-15 (×2): qty 100

## 2019-06-15 NOTE — ED Notes (Signed)
Tele   Breakfast ordered  

## 2019-06-15 NOTE — ED Notes (Signed)
Called [employer] RE placement of back brace.  They stated PT here is trained to place back braces.  PT paged and notified we are moving pt to 5W.

## 2019-06-15 NOTE — Progress Notes (Signed)
Orthopedic Tech Progress Note Patient Details:  [name] 1920/01/24 [id number] Called in order to [facility] for a TLSO brace for patient Patient ID: [name], male   DOB: [birth year], 85 y.o.   MRN: [id number]   [name] 06/15/2019, 10:04 AM

## 2019-06-15 NOTE — Progress Notes (Signed)
Pt's BP did not drop when checked while lying, to sitting, to standing. No orthostatic BP evident at this time. Will continue to monitor pt.

## 2019-06-15 NOTE — Progress Notes (Signed)
Progress Note    [name]  [id number] DOB: 07-07-1920  DOA: 06/14/2019 PCP: Abner Greenspan, MD    Brief Narrative:     Medical records reviewed and are as summarized below:  [name] is an 85 y.o. male with history of prostate cancer in remission, hypothyroidism, B12 deficiency anemia on supplements, hypertension presently not on medication, urinary incontinence on oxybutynin had come to the ER about a week ago after patient was feeling weak and at that time patient was diagnosed with UTI and discharged home on antibiotics.  Per patient's daughter patient did well after taking antibiotics regained his strength.  Today while walking with the help of walker patient had a fall and was helped onto the floor by the healthcare worker.  Following which patient was finding it too weak to stand back on his legs.  Patient was brought to the ER.  Did not have any incontinence of urine or bowel or any pain.  Did not hit his head or lose consciousness.  Assessment/Plan:   Principal Problem:   Lower extremity weakness Active Problems:   History of prostate cancer   Hypothyroid   B12 deficiency   PVD (peripheral vascular disease) (HCC)   Weakness    Bilateral lower extremity weakness  --cause not clear- probably multifactorial -PT/OT -check orthostatics -monitor on tele for bradycardia (not on any nodal blocking agents)  T9 fracture -TLSO brace -spoke with NS  Hypothyroidism - on Synthroid.  B12 deficiency  -replaced  History of prostate cancer in remission. -on ditropan-- ? D/c  History of hypertension  -presently not on any medication  -PRN hydralazine  Abnormal chest x-ray showing possibility of pneumonia.    -procalcitonin is negative  -family reports that patient did vomit prior to arrival so could be an aspiration component- check procalcitonin in AM-- hold abx for now   Family Communication/Anticipated D/C date and plan/Code Status   DVT prophylaxis:  scd Code Status: Full Code.  Family Communication: spoke with daughter on phone Disposition Plan: patient lives with 35 yo wife and his son with down syndrome who is on hospice-- not safe for discharge yet, will change to inpatient as will exceed > 2 midnights   Medical Consultants:    Neurology  NS (phone)    Subjective:   Denies back pain Says he has numbness and tingling in his legs long-standing  Objective:    Vitals:   06/15/19 1030 06/15/19 1045 06/15/19 1100 06/15/19 1115  BP: (!) 147/60 (!) 149/57 (!) 161/55 (!) 165/62  Pulse: (!) 51 (!) 48 (!) 48 (!) 54  Resp: 15 15 11 15   Temp:      TempSrc:      SpO2: 99% 100% 99% 100%   No intake or output data in the 24 hours ending 06/15/19 1354 There were no vitals filed for this visit.  Exam: In bed, hard of hearing rrr No increased work of breathing +BS, soft, NT No focal weakness, moves all 4 ext  Data Reviewed:   I have personally reviewed following labs and imaging studies:  Labs: Labs show the following:   Basic Metabolic Panel: Recent Labs  Lab 06/14/19 1719  NA 135  K 4.3  CL 100  CO2 25  GLUCOSE 113*  BUN 28*  CREATININE 1.16  CALCIUM 9.3   GFR Estimated Creatinine Clearance: 35 mL/min (by C-G formula based on SCr of 1.16 mg/dL). Liver Function Tests: No results for input(s): AST, ALT, ALKPHOS, BILITOT, PROT,  ALBUMIN in the last 168 hours. No results for input(s): LIPASE, AMYLASE in the last 168 hours. No results for input(s): AMMONIA in the last 168 hours. Coagulation profile No results for input(s): INR, PROTIME in the last 168 hours.  CBC: Recent Labs  Lab 06/14/19 1719  WBC 8.7  HGB 11.8*  HCT 34.2*  MCV 96.9  PLT 308   Cardiac Enzymes: No results for input(s): CKTOTAL, CKMB, CKMBINDEX, TROPONINI in the last 168 hours. BNP (last 3 results) No results for input(s): PROBNP in the last 8760 hours. CBG: No results for input(s): GLUCAP in the last 168 hours. D-Dimer: No  results for input(s): DDIMER in the last 72 hours. Hgb A1c: No results for input(s): HGBA1C in the last 72 hours. Lipid Profile: No results for input(s): CHOL, HDL, LDLCALC, TRIG, CHOLHDL, LDLDIRECT in the last 72 hours. Thyroid function studies: Recent Labs    06/14/19 2341  TSH 2.061   Anemia work up: Recent Labs    06/14/19 2341  VITAMINB12 1,445*  FOLATE 26.3  FERRITIN 131  TIBC 283  IRON 58  RETICCTPCT 1.2   Sepsis Labs: Recent Labs  Lab 06/14/19 0624 06/14/19 1719  PROCALCITON <0.10  --   WBC  --  8.7    Microbiology Recent Results (from the past 240 hour(s))  Urine Culture     Status: Abnormal   Collection Time: 06/07/19  4:28 PM   Specimen: Urine, Random  Result Value Ref Range Status   Specimen Description URINE, RANDOM  Final   Special Requests   Final    NONE Performed at [facility], [address] [place], [place] [address]    Culture MULTIPLE SPECIES PRESENT, SUGGEST RECOLLECTION (A)  Final   Report Status 06/08/2019 FINAL  Final  SARS CORONAVIRUS 2 (TAT 6-24 HRS) Nasopharyngeal Nasopharyngeal Swab     Status: None   Collection Time: 06/14/19  9:41 PM   Specimen: Nasopharyngeal Swab  Result Value Ref Range Status   SARS Coronavirus 2 NEGATIVE NEGATIVE Final    Comment: (NOTE) SARS-CoV-2 target nucleic acids are NOT DETECTED. The SARS-CoV-2 RNA is generally detectable in upper and lower respiratory specimens during the acute phase of infection. Negative results do not preclude SARS-CoV-2 infection, do not rule out co-infections with other pathogens, and should not be used as the sole basis for treatment or other patient management decisions. Negative results must be combined with clinical observations, patient history, and epidemiological information. The expected result is Negative. Fact Sheet for Patients: SugarRoll.be Fact Sheet for Healthcare Providers: https://www.woods-mathews.com/ This  test is not yet approved or cleared by the [place] FDA and  has been authorized for detection and/or diagnosis of SARS-CoV-2 by FDA under an Emergency Use Authorization (EUA). This EUA will remain  in effect (meaning this test can be used) for the duration of the COVID-19 declaration under Section 56 4(b)(1) of the Act, 21 U.S.C. section 360bbb-3(b)(1), unless the authorization is terminated or revoked sooner. Performed at [facility], [place] [address], [place], [place] [address]     Procedures and diagnostic studies:  Dg Chest 2 View  Result Date: 06/14/2019 CLINICAL DATA:  85 year old presenting with generalized weakness. EXAM: CHEST - 2 VIEW COMPARISON:  06/07/2019 and earlier. FINDINGS: Suboptimal inspiration accounts for crowded bronchovascular markings, especially in the bases, and accentuates the cardiac silhouette. Taking this into account, cardiac silhouette upper normal in size, unchanged. Streaky and patchy airspace opacities in the lung bases, LEFT greater than RIGHT, new since  the prior examination. Nodular opacities projecting over the RIGHT lung, unchanged. No visible pleural effusions. IMPRESSION: 1. Suboptimal inspiration. Atelectasis and/or bronchopneumonia involving the lung bases, LEFT greater than RIGHT. 2. Nodular opacities projecting over the RIGHT lung as seen on the most recent prior examination. Electronically Signed   By: Evangeline Dakin M.D.   On: 06/14/2019 19:40   Dg Lumbar Spine Complete  Result Date: 06/14/2019 CLINICAL DATA:  85 year old presenting with generalized weakness and low back pain radiating into both LOWER extremities that began yesterday. EXAM: LUMBAR SPINE - COMPLETE 4+ VIEW COMPARISON:  05/02/2014. FINDINGS: Five non-rib-bearing lumbar vertebrae with anatomic posterior alignment. No acute fractures. Remote compression fractures of T12 and L1, unchanged since the prior examination. Severe disc space narrowing at L4-5, moderate disc space  narrowing at L1-2 and L2-3, and mild disc space narrowing at L3-4; the narrowing at L4-5 has progressed since the prior examination, though the remaining levels are stable. Diffuse facet degenerative changes. No pars defects. DISH involving the lower thoracic spine. Aorto-iliac atherosclerosis without evidence of aneurysm. IMPRESSION: 1. No acute osseous abnormality. 2. Stable remote compression fractures of T12 and L1. 3. Multilevel degenerative disc disease in spondylosis, worst at L4-5. The degenerative disc disease at L4-5 has worsened since 2015. The degenerative disc disease at the other levels is stable. Electronically Signed   By: Evangeline Dakin M.D.   On: 06/14/2019 19:37   Ct Head Wo Contrast  Result Date: 06/14/2019 CLINICAL DATA:  Bilateral leg weakness. EXAM: CT HEAD WITHOUT CONTRAST TECHNIQUE: Contiguous axial images were obtained from the base of the skull through the vertex without intravenous contrast. COMPARISON:  CT head dated June 07, 2019. FINDINGS: Brain: No evidence of acute infarction, hemorrhage, hydrocephalus, extra-axial collection or mass lesion/mass effect. Stable moderate atrophy and chronic microvascular ischemic changes. Vascular: Atherosclerotic vascular calcification of the carotid siphons. No hyperdense vessel. Skull: Negative for fracture or focal lesion. Sinuses/Orbits: No acute finding. Other: None. IMPRESSION: 1.  No acute intracranial abnormality. 2. Stable moderate atrophy and chronic microvascular ischemic changes. Electronically Signed   By: Titus Dubin M.D.   On: 06/14/2019 20:11   Mr Thoracic Spine Wo Contrast  Result Date: 06/15/2019 CLINICAL DATA:  One day history of bilateral lower extremity weakness EXAM: MRI THORACIC and lumbar SPINE WITHOUT CONTRAST TECHNIQUE: Multiplanar, multisequence MR imaging of the thoracic and lumbar spine was performed. No intravenous contrast was administered. COMPARISON:  None. FINDINGS: Alignment: Are slightly  exaggerated kyphosis of the upper thoracic spine. Vertebrae: There is a acute/subacute fracture of the anterior T9 vertebral body extending through the bridging anterior osteophyte. There is surrounding marrow edema. A small amount of fluid is seen within the fracture cleft. There is prevertebral soft tissue swelling and small prevertebral hematoma seen at the T9 level. The fracture line does appear to extend through the posterior aspect of the inferior endplate at T9. No retropulsion of fragments is seen. No epidural collection is noted. There is increased marrow signal seen within the T10 vertebral body and inferior T8 vertebral body. Chronic compression deformities of T12 and L1 are seen with approximately 50-75% loss in vertebral body height. Bridging anterior osteophytes are noted from T4 through L1. Cord: Normal in signal and morphology. Paraspinal and other soft tissues: The posterior ligamentous complex does appear to however be intact. Normal appearance to the paraspinal soft tissues and retroperitoneum. Disc levels: Multilevel mild disc height loss and facet arthrosis is seen throughout the thoracic spine. No significant canal stenosis is seen. Lumbar spine:  Segmentation: There are 5 non-rib bearing lumbar type vertebral bodies with the last intervertebral disc space labeled as L5-S1. Alignment: There is a minimal retrolisthesis of T12 on L1, L1 on L2 and L2 on L3. Vertebrae: Chronic superior compression deformities of T12 and L1 are seen with 57% loss in height. No retropulsion of fragments is seen. There is a buckling of the posterior cortex of T12. Conus medullaris and cauda equina: Conus extends to the L1 level. Conus and cauda equina appear normal. Paraspinal and other soft tissues: the paraspinal soft tissues and visualized retroperitoneal structures are unremarkable. The sacroiliac joints are intact. Disc levels: Multilevel disc degenerative changes are seen this is most notable at L2-L3 with mild  central canal stenosis and severe bilateral neural foraminal stenosis. IMPRESSION: 1. Acute/subacute fracture of the T9 vertebral body through the anterior bridging osteophyte and extending to the posterior inferior vertebral body cortex with fluid within the anterior fracture cleft. Prevertebral soft tissue swelling and small hematoma. No retropulsion of fragments. The posterior ligamentous complex is intact and no cord stenosis. 2. Marrow edema within the T8 and T10 vertebral bodies. 3. Chronic compression deformities of T12 and L1. 4. Findings of DISH involving the thoracolumbar spine These results were called by telephone at the time of interpretation on 06/15/2019 at 2:35 am to provider [facility] , who verbally acknowledged these results. Electronically Signed   By: Prudencio Pair M.D.   On: 06/15/2019 02:36   Mr Lumbar Spine Wo Contrast  Result Date: 06/15/2019 CLINICAL DATA:  One day history of bilateral lower extremity weakness EXAM: MRI THORACIC and lumbar SPINE WITHOUT CONTRAST TECHNIQUE: Multiplanar, multisequence MR imaging of the thoracic and lumbar spine was performed. No intravenous contrast was administered. COMPARISON:  None. FINDINGS: Alignment: Are slightly exaggerated kyphosis of the upper thoracic spine. Vertebrae: There is a acute/subacute fracture of the anterior T9 vertebral body extending through the bridging anterior osteophyte. There is surrounding marrow edema. A small amount of fluid is seen within the fracture cleft. There is prevertebral soft tissue swelling and small prevertebral hematoma seen at the T9 level. The fracture line does appear to extend through the posterior aspect of the inferior endplate at T9. No retropulsion of fragments is seen. No epidural collection is noted. There is increased marrow signal seen within the T10 vertebral body and inferior T8 vertebral body. Chronic compression deformities of T12 and L1 are seen with approximately 50-75% loss in vertebral  body height. Bridging anterior osteophytes are noted from T4 through L1. Cord: Normal in signal and morphology. Paraspinal and other soft tissues: The posterior ligamentous complex does appear to however be intact. Normal appearance to the paraspinal soft tissues and retroperitoneum. Disc levels: Multilevel mild disc height loss and facet arthrosis is seen throughout the thoracic spine. No significant canal stenosis is seen. Lumbar spine: Segmentation: There are 5 non-rib bearing lumbar type vertebral bodies with the last intervertebral disc space labeled as L5-S1. Alignment: There is a minimal retrolisthesis of T12 on L1, L1 on L2 and L2 on L3. Vertebrae: Chronic superior compression deformities of T12 and L1 are seen with 57% loss in height. No retropulsion of fragments is seen. There is a buckling of the posterior cortex of T12. Conus medullaris and cauda equina: Conus extends to the L1 level. Conus and cauda equina appear normal. Paraspinal and other soft tissues: the paraspinal soft tissues and visualized retroperitoneal structures are unremarkable. The sacroiliac joints are intact. Disc levels: Multilevel disc degenerative changes are seen this is most  notable at L2-L3 with mild central canal stenosis and severe bilateral neural foraminal stenosis. IMPRESSION: 1. Acute/subacute fracture of the T9 vertebral body through the anterior bridging osteophyte and extending to the posterior inferior vertebral body cortex with fluid within the anterior fracture cleft. Prevertebral soft tissue swelling and small hematoma. No retropulsion of fragments. The posterior ligamentous complex is intact and no cord stenosis. 2. Marrow edema within the T8 and T10 vertebral bodies. 3. Chronic compression deformities of T12 and L1. 4. Findings of DISH involving the thoracolumbar spine These results were called by telephone at the time of interpretation on 06/15/2019 at 2:35 am to provider [facility] , who verbally acknowledged  these results. Electronically Signed   By: Prudencio Pair M.D.   On: 06/15/2019 02:36    Medications:    aspirin EC  81 mg Oral 3 times weekly   levothyroxine  25 mcg Oral QAC breakfast   oxybutynin  5 mg Oral See admin instructions   sodium chloride flush  3 mL Intravenous Once   vitamin B-12  100 mcg Oral QODAY   Continuous Infusions:   LOS: 0 days   Geradine Girt  [facility]   How to contact the [facility] Attending or Consulting provider [place] or covering provider during after hours [place], for this patient?  1. Check the care team in [facility] and look for a) attending/consulting [facility] provider listed and b) the [facility] team listed 2. Log into [email or web address] and use [place] universal password to access. If you do not have the password, please contact the hospital operator. 3. Locate the [facility] provider you are looking for under [facility] and page to a number that you can be directly reached. 4. If you still have difficulty reaching the provider, please page the [facility] (Director on Call) for the Hospitalists listed on amion for assistance.  06/15/2019, 1:54 PM

## 2019-06-15 NOTE — Progress Notes (Signed)
Pt when bladder scanned showed he is retaining 250 ml. RN reported finding to [facility] MD. She gave verbal order to continue to monitor pt and in/out cath pt if gets to >491ml. RN will report to night shift RN and encourage pt to use urinal in mean time.

## 2019-06-15 NOTE — ED Notes (Signed)
Patient transported to MRI 

## 2019-06-15 NOTE — ED Notes (Signed)
ED TO INPATIENT HANDOFF REPORT  ED Nurse Name and Phone #: Nicki Reaper [place] Name/Age/Gender [name] 85 y.o. male Room/Bed: 053C/053C  Code Status   Code Status: Full Code  Home/SNF/Other Home Patient oriented to: self, place, time and situation Is this baseline? Yes   Triage Complete: Triage complete  Chief Complaint N/V, weakness  Triage Note Pt arrives via EMS from home with reports of weakness in bilateral legs starting around lunch today. EMS reports A&Ox4 and seen here for UTI last week. EMS reports 1st degree HB on 12 lead and HTN   Allergies Allergies  Allergen Reactions  . Simvastatin Other (See Comments)    Aches and pains    Level of Care/Admitting Diagnosis ED Disposition    ED Disposition Condition [facility] Area: [facility] [[id number]]  Level of Care: Telemetry Medical [104]  Covid Evaluation: Confirmed COVID Negative  Diagnosis: Weakness [id number]  Admitting Physician: Maida Sale  Attending Physician: Geradine Girt [[id number]]  Estimated length of stay: past midnight tomorrow  Certification:: I certify this patient will need inpatient services for at least 2 midnights  PT Class (Do Not Modify): Inpatient [101]  PT Acc Code (Do Not Modify): Private [1]       B Medical/Surgery History Past Medical History:  Diagnosis Date  . Cancer [facility])    prostate  . Carotid artery stenosis   . Degenerative disc disease   . Fractures    compression  . History of prostate cancer   . Hyperlipidemia   . Hypertension   . Kyphosis   . Non-cardiac chest pain 04/11   hospital ? [facility]   Past Surgical History:  Procedure Laterality Date  . CARDIOVASCULAR STRESS TEST  2004   cardiolite negative  . carotid doppler  11/05   0-39% bilatteral  . herniated disc surgery  1990   L3-4  . [place]   right  . prostate cancer surgery    . PROSTATE SURGERY    . TONSILLECTOMY       A IV  Location/Drains/Wounds Patient Lines/Drains/Airways Status   Active Line/Drains/Airways    Name:   Placement date:   Placement time:   Site:   Days:   Peripheral IV 06/14/19 Right Wrist   06/14/19    1840    Wrist   1          Intake/Output Last 24 hours No intake or output data in the 24 hours ending 06/15/19 1445  Labs/Imaging Results for orders placed or performed during the hospital encounter of 06/14/19 (from the past 48 hour(s))  Procalcitonin - Baseline     Status: None   Collection Time: 06/14/19  6:24 AM  Result Value Ref Range   Procalcitonin <0.10 ng/mL    Comment:        Interpretation: PCT (Procalcitonin) <= 0.5 ng/mL: Systemic infection (sepsis) is not likely. Local bacterial infection is possible. (NOTE)       Sepsis PCT Algorithm           Lower Respiratory Tract                                      Infection PCT Algorithm    ----------------------------     ----------------------------         PCT < 0.25 ng/mL  PCT < 0.10 ng/mL         Strongly encourage             Strongly discourage   discontinuation of antibiotics    initiation of antibiotics    ----------------------------     -----------------------------       PCT 0.25 - 0.50 ng/mL            PCT 0.10 - 0.25 ng/mL               OR       >80% decrease in PCT            Discourage initiation of                                            antibiotics      Encourage discontinuation           of antibiotics    ----------------------------     -----------------------------         PCT >= 0.50 ng/mL              PCT 0.26 - 0.50 ng/mL               AND        <80% decrease in PCT             Encourage initiation of                                             antibiotics       Encourage continuation           of antibiotics    ----------------------------     -----------------------------        PCT >= 0.50 ng/mL                  PCT > 0.50 ng/mL               AND         increase in PCT                   Strongly encourage                                      initiation of antibiotics    Strongly encourage escalation           of antibiotics                                     -----------------------------                                           PCT <= 0.25 ng/mL                                                 OR                                        >  80% decrease in PCT                                     Discontinue / Do not initiate                                             antibiotics Performed at [facility], [place] [address], [place], [place] [id number]   Basic metabolic panel     Status: Abnormal   Collection Time: 06/14/19  5:19 PM  Result Value Ref Range   Sodium 135 135 - 145 mmol/L   Potassium 4.3 3.5 - 5.1 mmol/L   Chloride 100 98 - 111 mmol/L   CO2 25 22 - 32 mmol/L   Glucose, Bld 113 (H) 70 - 99 mg/dL   BUN 28 (H) 8 - 23 mg/dL   Creatinine, Ser 1.16 0.61 - 1.24 mg/dL   Calcium 9.3 8.9 - 10.3 mg/dL   GFR calc non Af Amer 52 (L) >60 mL/min   GFR calc Af Amer >60 >60 mL/min   Anion gap 10 5 - 15    Comment: Performed at [facility], [place] [address], [place], [place] [address]  CBC     Status: Abnormal   Collection Time: 06/14/19  5:19 PM  Result Value Ref Range   WBC 8.7 4.0 - 10.5 K/uL   RBC 3.53 (L) 4.22 - 5.81 MIL/uL   Hemoglobin 11.8 (L) 13.0 - 17.0 g/dL   HCT 34.2 (L) 39.0 - 52.0 %   MCV 96.9 80.0 - 100.0 fL   MCH 33.4 26.0 - 34.0 pg   MCHC 34.5 30.0 - 36.0 g/dL   RDW 12.5 11.5 - 15.5 %   Platelets 308 150 - 400 K/uL   nRBC 0.0 0.0 - 0.2 %    Comment: Performed at [facility], [place] [address] [place], [place] [address]  Urinalysis, Routine w reflex microscopic     Status: Abnormal   Collection Time: 06/14/19  6:52 PM  Result Value Ref Range   Color, Urine YELLOW YELLOW   APPearance CLOUDY (A) CLEAR   Specific Gravity, Urine 1.015 1.005 - 1.030   pH 8.0 5.0 - 8.0   Glucose, UA NEGATIVE NEGATIVE mg/dL   Hgb urine  dipstick NEGATIVE NEGATIVE   Bilirubin Urine NEGATIVE NEGATIVE   Ketones, ur NEGATIVE NEGATIVE mg/dL   Protein, ur 30 (A) NEGATIVE mg/dL   Nitrite NEGATIVE NEGATIVE   Leukocytes,Ua NEGATIVE NEGATIVE   RBC / HPF 0-5 0 - 5 RBC/hpf   WBC, UA 0-5 0 - 5 WBC/hpf   Bacteria, UA RARE (A) NONE SEEN   Amorphous Crystal PRESENT     Comment: Performed at [place] [address] [place], [place] [address]  SARS CORONAVIRUS 2 (TAT 6-24 HRS) Nasopharyngeal Nasopharyngeal Swab     Status: None   Collection Time: 06/14/19  9:41 PM   Specimen: Nasopharyngeal Swab  Result Value Ref Range   SARS Coronavirus 2 NEGATIVE NEGATIVE    Comment: (NOTE) SARS-CoV-2 target nucleic acids are NOT DETECTED. The SARS-CoV-2 RNA is generally detectable in upper and lower respiratory specimens during the acute phase of infection. Negative results do not preclude SARS-CoV-2 infection, do not rule out co-infections with other pathogens, and should not be  used as the sole basis for treatment or other patient management decisions. Negative results must be combined with clinical observations, patient history, and epidemiological information. The expected result is Negative. Fact Sheet for Patients: SugarRoll.be Fact Sheet for Healthcare Providers: https://www.woods-mathews.com/ This test is not yet approved or cleared by the [place] FDA and  has been authorized for detection and/or diagnosis of SARS-CoV-2 by FDA under an Emergency Use Authorization (EUA). This EUA will remain  in effect (meaning this test can be used) for the duration of the COVID-19 declaration under Section 56 4(b)(1) of the Act, 21 U.S.C. section 360bbb-3(b)(1), unless the authorization is terminated or revoked sooner. Performed at [facility], [place] [address], [place], [place] [address]   Vitamin B12     Status: Abnormal   Collection Time: 06/14/19 11:41 PM  Result Value Ref Range    Vitamin B-12 1,445 (H) 180 - 914 pg/mL    Comment: (NOTE) This assay is not validated for testing neonatal or myeloproliferative syndrome specimens for Vitamin B12 levels. Performed at [facility], [place] [address], [place], [place] [address]   Folate     Status: None   Collection Time: 06/14/19 11:41 PM  Result Value Ref Range   Folate 26.3 >5.9 ng/mL    Comment: Performed at [facility], [place] [address], [place], [place] [address]  Iron and TIBC     Status: None   Collection Time: 06/14/19 11:41 PM  Result Value Ref Range   Iron 58 45 - 182 ug/dL   TIBC 283 250 - 450 ug/dL   Saturation Ratios 21 17.9 - 39.5 %   UIBC 225 ug/dL    Comment: Performed at [facility] [address], [place], [place] [address]  Ferritin     Status: None   Collection Time: 06/14/19 11:41 PM  Result Value Ref Range   Ferritin 131 24 - 336 ng/mL    Comment: Performed at [place] [address] [place], [place] [address]  Reticulocytes     Status: Abnormal   Collection Time: 06/14/19 11:41 PM  Result Value Ref Range   Retic Ct Pct 1.2 0.4 - 3.1 %   RBC. 3.62 (L) 4.22 - 5.81 MIL/uL   Retic Count, Absolute 44.9 19.0 - 186.0 K/uL   Immature Retic Fract 6.1 2.3 - 15.9 %    Comment: Performed at [facility] [address] [place],  [place] [address]  TSH     Status: None   Collection Time: 06/14/19 11:41 PM  Result Value Ref Range   TSH 2.061 0.350 - 4.500 uIU/mL    Comment: Performed by a 3rd Generation assay with a functional sensitivity of <=0.01 uIU/mL. Performed at [facility], [place] [address], [place], [place] [address]    Dg Chest 2 View  Result Date: 06/14/2019 CLINICAL DATA:  85 year old presenting with generalized weakness. EXAM: CHEST - 2 VIEW COMPARISON:  06/07/2019 and earlier. FINDINGS: Suboptimal inspiration accounts for crowded bronchovascular markings, especially in the bases, and accentuates the cardiac silhouette. Taking this into  account, cardiac silhouette upper normal in size, unchanged. Streaky and patchy airspace opacities in the lung bases, LEFT greater than RIGHT, new since the prior examination. Nodular opacities projecting over the RIGHT lung, unchanged. No visible pleural effusions. IMPRESSION: 1. Suboptimal inspiration. Atelectasis and/or bronchopneumonia involving the lung bases, LEFT greater than RIGHT. 2. Nodular opacities projecting over the RIGHT lung as seen on the most  recent prior examination. Electronically Signed   By: Evangeline Dakin M.D.   On: 06/14/2019 19:40   Dg Lumbar Spine Complete  Result Date: 06/14/2019 CLINICAL DATA:  86 year old presenting with generalized weakness and low back pain radiating into both LOWER extremities that began yesterday. EXAM: LUMBAR SPINE - COMPLETE 4+ VIEW COMPARISON:  05/02/2014. FINDINGS: Five non-rib-bearing lumbar vertebrae with anatomic posterior alignment. No acute fractures. Remote compression fractures of T12 and L1, unchanged since the prior examination. Severe disc space narrowing at L4-5, moderate disc space narrowing at L1-2 and L2-3, and mild disc space narrowing at L3-4; the narrowing at L4-5 has progressed since the prior examination, though the remaining levels are stable. Diffuse facet degenerative changes. No pars defects. DISH involving the lower thoracic spine. Aorto-iliac atherosclerosis without evidence of aneurysm. IMPRESSION: 1. No acute osseous abnormality. 2. Stable remote compression fractures of T12 and L1. 3. Multilevel degenerative disc disease in spondylosis, worst at L4-5. The degenerative disc disease at L4-5 has worsened since 2015. The degenerative disc disease at the other levels is stable. Electronically Signed   By: Evangeline Dakin M.D.   On: 06/14/2019 19:37   Ct Head Wo Contrast  Result Date: 06/14/2019 CLINICAL DATA:  Bilateral leg weakness. EXAM: CT HEAD WITHOUT CONTRAST TECHNIQUE: Contiguous axial images were obtained from the base of  the skull through the vertex without intravenous contrast. COMPARISON:  CT head dated June 07, 2019. FINDINGS: Brain: No evidence of acute infarction, hemorrhage, hydrocephalus, extra-axial collection or mass lesion/mass effect. Stable moderate atrophy and chronic microvascular ischemic changes. Vascular: Atherosclerotic vascular calcification of the carotid siphons. No hyperdense vessel. Skull: Negative for fracture or focal lesion. Sinuses/Orbits: No acute finding. Other: None. IMPRESSION: 1.  No acute intracranial abnormality. 2. Stable moderate atrophy and chronic microvascular ischemic changes. Electronically Signed   By: Titus Dubin M.D.   On: 06/14/2019 20:11   Mr Thoracic Spine Wo Contrast  Result Date: 06/15/2019 CLINICAL DATA:  One day history of bilateral lower extremity weakness EXAM: MRI THORACIC and lumbar SPINE WITHOUT CONTRAST TECHNIQUE: Multiplanar, multisequence MR imaging of the thoracic and lumbar spine was performed. No intravenous contrast was administered. COMPARISON:  None. FINDINGS: Alignment: Are slightly exaggerated kyphosis of the upper thoracic spine. Vertebrae: There is a acute/subacute fracture of the anterior T9 vertebral body extending through the bridging anterior osteophyte. There is surrounding marrow edema. A small amount of fluid is seen within the fracture cleft. There is prevertebral soft tissue swelling and small prevertebral hematoma seen at the T9 level. The fracture line does appear to extend through the posterior aspect of the inferior endplate at T9. No retropulsion of fragments is seen. No epidural collection is noted. There is increased marrow signal seen within the T10 vertebral body and inferior T8 vertebral body. Chronic compression deformities of T12 and L1 are seen with approximately 50-75% loss in vertebral body height. Bridging anterior osteophytes are noted from T4 through L1. Cord: Normal in signal and morphology. Paraspinal and other soft  tissues: The posterior ligamentous complex does appear to however be intact. Normal appearance to the paraspinal soft tissues and retroperitoneum. Disc levels: Multilevel mild disc height loss and facet arthrosis is seen throughout the thoracic spine. No significant canal stenosis is seen. Lumbar spine: Segmentation: There are 5 non-rib bearing lumbar type vertebral bodies with the last intervertebral disc space labeled as L5-S1. Alignment: There is a minimal retrolisthesis of T12 on L1, L1 on L2 and L2 on L3. Vertebrae: Chronic superior compression deformities of T12  and L1 are seen with 57% loss in height. No retropulsion of fragments is seen. There is a buckling of the posterior cortex of T12. Conus medullaris and cauda equina: Conus extends to the L1 level. Conus and cauda equina appear normal. Paraspinal and other soft tissues: the paraspinal soft tissues and visualized retroperitoneal structures are unremarkable. The sacroiliac joints are intact. Disc levels: Multilevel disc degenerative changes are seen this is most notable at L2-L3 with mild central canal stenosis and severe bilateral neural foraminal stenosis. IMPRESSION: 1. Acute/subacute fracture of the T9 vertebral body through the anterior bridging osteophyte and extending to the posterior inferior vertebral body cortex with fluid within the anterior fracture cleft. Prevertebral soft tissue swelling and small hematoma. No retropulsion of fragments. The posterior ligamentous complex is intact and no cord stenosis. 2. Marrow edema within the T8 and T10 vertebral bodies. 3. Chronic compression deformities of T12 and L1. 4. Findings of DISH involving the thoracolumbar spine These results were called by telephone at the time of interpretation on 06/15/2019 at 2:35 am to provider [facility] , who verbally acknowledged these results. Electronically Signed   By: Prudencio Pair M.D.   On: 06/15/2019 02:36   Mr Lumbar Spine Wo Contrast  Result Date:  06/15/2019 CLINICAL DATA:  One day history of bilateral lower extremity weakness EXAM: MRI THORACIC and lumbar SPINE WITHOUT CONTRAST TECHNIQUE: Multiplanar, multisequence MR imaging of the thoracic and lumbar spine was performed. No intravenous contrast was administered. COMPARISON:  None. FINDINGS: Alignment: Are slightly exaggerated kyphosis of the upper thoracic spine. Vertebrae: There is a acute/subacute fracture of the anterior T9 vertebral body extending through the bridging anterior osteophyte. There is surrounding marrow edema. A small amount of fluid is seen within the fracture cleft. There is prevertebral soft tissue swelling and small prevertebral hematoma seen at the T9 level. The fracture line does appear to extend through the posterior aspect of the inferior endplate at T9. No retropulsion of fragments is seen. No epidural collection is noted. There is increased marrow signal seen within the T10 vertebral body and inferior T8 vertebral body. Chronic compression deformities of T12 and L1 are seen with approximately 50-75% loss in vertebral body height. Bridging anterior osteophytes are noted from T4 through L1. Cord: Normal in signal and morphology. Paraspinal and other soft tissues: The posterior ligamentous complex does appear to however be intact. Normal appearance to the paraspinal soft tissues and retroperitoneum. Disc levels: Multilevel mild disc height loss and facet arthrosis is seen throughout the thoracic spine. No significant canal stenosis is seen. Lumbar spine: Segmentation: There are 5 non-rib bearing lumbar type vertebral bodies with the last intervertebral disc space labeled as L5-S1. Alignment: There is a minimal retrolisthesis of T12 on L1, L1 on L2 and L2 on L3. Vertebrae: Chronic superior compression deformities of T12 and L1 are seen with 57% loss in height. No retropulsion of fragments is seen. There is a buckling of the posterior cortex of T12. Conus medullaris and cauda equina:  Conus extends to the L1 level. Conus and cauda equina appear normal. Paraspinal and other soft tissues: the paraspinal soft tissues and visualized retroperitoneal structures are unremarkable. The sacroiliac joints are intact. Disc levels: Multilevel disc degenerative changes are seen this is most notable at L2-L3 with mild central canal stenosis and severe bilateral neural foraminal stenosis. IMPRESSION: 1. Acute/subacute fracture of the T9 vertebral body through the anterior bridging osteophyte and extending to the posterior inferior vertebral body cortex with fluid within the anterior fracture  cleft. Prevertebral soft tissue swelling and small hematoma. No retropulsion of fragments. The posterior ligamentous complex is intact and no cord stenosis. 2. Marrow edema within the T8 and T10 vertebral bodies. 3. Chronic compression deformities of T12 and L1. 4. Findings of DISH involving the thoracolumbar spine These results were called by telephone at the time of interpretation on 06/15/2019 at 2:35 am to provider [facility] , who verbally acknowledged these results. Electronically Signed   By: Prudencio Pair M.D.   On: 06/15/2019 02:36    Pending Labs Unresulted Labs (From admission, onward)    Start     Ordered   06/16/19 0500  Procalcitonin  Daily,   R     06/15/19 1432   06/14/19 2319  Methylmalonic acid, serum  ONCE - STAT,   STAT     06/14/19 2319          Vitals/Pain Today's Vitals   06/15/19 1100 06/15/19 1115 06/15/19 1300 06/15/19 1400  BP: (!) 161/55 (!) 165/62 (!) 164/67 (!) 152/67  Pulse: (!) 48 (!) 54  (!) 58  Resp: 11 15 10 16   Temp:      TempSrc:      SpO2: 99% 100%  100%  PainSc:        Isolation Precautions No active isolations  Medications Medications  sodium chloride flush (NS) 0.9 % injection 3 mL (3 mLs Intravenous Not Given 06/14/19 2136)  aspirin EC tablet 81 mg (81 mg Oral Given 06/15/19 0926)  levothyroxine (SYNTHROID) tablet 25 mcg (25 mcg Oral Given 06/15/19  0926)  Beano TABS 1 tablet (has no administration in time range)  oxybutynin (DITROPAN-XL) 24 hr tablet 5 mg (has no administration in time range)  vitamin B-12 (CYANOCOBALAMIN) tablet 100 mcg (100 mcg Oral Not Given 06/15/19 1426)  guaiFENesin (MUCINEX) 12 hr tablet 600 mg (has no administration in time range)  acetaminophen (TYLENOL) tablet 650 mg (has no administration in time range)    Or  acetaminophen (TYLENOL) suppository 650 mg (has no administration in time range)  ondansetron (ZOFRAN) tablet 4 mg (has no administration in time range)    Or  ondansetron (ZOFRAN) injection 4 mg (has no administration in time range)    Mobility walks High fall risk   Focused Assessments Neuro Assessment Handoff:  Swallow screen pass? N/A         Neuro Assessment: Within Defined Limits Neuro Checks:      Last Documented NIHSS Modified Score:   Has TPA been given? No If patient is a Neuro Trauma and patient is going to OR before floor call report to [facility] nurse: [phone] or [phone]     R Recommendations: See Admitting Provider Note  Report given to:   Additional Notes:

## 2019-06-15 NOTE — ED Notes (Addendum)
Family notified of pt's admitted room #.

## 2019-06-16 DIAGNOSIS — S22071A Stable burst fracture of T9-T10 vertebra, initial encounter for closed fracture: Secondary | ICD-10-CM

## 2019-06-16 DIAGNOSIS — Z515 Encounter for palliative care: Secondary | ICD-10-CM

## 2019-06-16 DIAGNOSIS — R531 Weakness: Secondary | ICD-10-CM

## 2019-06-16 DIAGNOSIS — Z7189 Other specified counseling: Secondary | ICD-10-CM

## 2019-06-16 LAB — PROCALCITONIN: Procalcitonin: <0.1 ng/mL

## 2019-06-16 MED ORDER — ADULT MULTIVITAMIN W/MINERALS CH
1.0000 | ORAL_TABLET | Freq: Every day | ORAL | Status: DC
  Administered 2019-06-17: 1 via ORAL
  Filled 2019-06-16 (×2): qty 1

## 2019-06-16 MED ORDER — INFLUENZA VAC A&B SA ADJ QUAD 0.5 ML IM PRSY
0.5000 mL | PREFILLED_SYRINGE | INTRAMUSCULAR | Status: AC
  Administered 2019-06-17: 0.5 mL via INTRAMUSCULAR
  Filled 2019-06-16: qty 0.5

## 2019-06-16 MED ORDER — ENOXAPARIN SODIUM 40 MG/0.4ML ~~LOC~~ SOLN
40.0000 mg | SUBCUTANEOUS | Status: DC
  Administered 2019-06-16: 40 mg via SUBCUTANEOUS
  Filled 2019-06-16: qty 0.4

## 2019-06-16 NOTE — Evaluation (Signed)
Physical Therapy Evaluation Patient Details Name: [name] MRN: [id number] DOB: 03-16-1920 Today's Date: 06/16/2019   History of Present Illness  85 year old male with history of prostate cancer in remission, hypothyroidism, B12 deficiency, anemia, hypertension, urinary incontinence on oxybutynin presented with lower extremity weakness along with fall. Pt with acute to subacute T9 compression fracture.  Clinical Impression   Pt presents with generalized weakness, decreased knowledge of spinal precautions and application of back brace, difficulty mobilizing, very impaired standing balance, and decreased activity tolerance. Pt to benefit from acute PT to address deficits. Pt ambulated 3 ft in room with mod assist +2, unable to continue due to pt unsteadiness and posterior leaning. PT recommending SNF, pt hoping to progress to HHPT level of care while acute. PT to progress mobility as tolerated, and will continue to follow acutely.      Follow Up Recommendations SNF;Supervision/Assistance - 24 hour    Equipment Recommendations  Other (comment)(defer to next venue)    Recommendations for Other Services       Precautions / Restrictions Precautions Precautions: Fall;Back Precaution Booklet Issued: Yes (comment) Precaution Comments: pt edcuated on back precautions with handout provided Required Braces or Orthoses: Spinal Brace Spinal Brace: Thoracolumbosacral orthotic Restrictions Weight Bearing Restrictions: No      Mobility  Bed Mobility Overal bed mobility: Needs Assistance             General bed mobility comments: pt in recliner upon arrival  Transfers Overall transfer level: Needs assistance Equipment used: Rolling walker (2 wheeled) Transfers: Sit to/from Stand Sit to Stand: Mod assist;+2 physical assistance         General transfer comment: Mod assist for power up, steadying. Verbal cuing for hand placement from support surface when rising, pt with posterior  and L lateral leaning in standing.  Ambulation/Gait Ambulation/Gait assistance: Mod assist;+2 physical assistance;+2 safety/equipment Gait Distance (Feet): 3 Feet Assistive device: Rolling walker (2 wheeled) Gait Pattern/deviations: Step-to pattern;Decreased step length - right;Decreased step length - left;Trunk flexed;Leaning posteriorly;Drifts right/left Gait velocity: decr   General Gait Details: Mod assist +2 for steadying, guiding RW. Pt with heavy posterior and L lateral leaning, unable to recognize and correct posture. Further ambulation terminated  Stairs            Wheelchair Mobility    Modified Rankin (Stroke Patients Only)       Balance Overall balance assessment: Needs assistance   Sitting balance-Leahy Scale: Fair   Postural control: Posterior lean;Left lateral lean Standing balance support: Bilateral upper extremity supported;During functional activity Standing balance-Leahy Scale: Poor                               Pertinent Vitals/Pain Pain Assessment: Faces Faces Pain Scale: Hurts a little bit Pain Location: R leg Pain Descriptors / Indicators: Cramping Pain Intervention(s): Limited activity within patient's tolerance;Monitored during session;Repositioned    Home Living Family/patient expects to be discharged to:: Private residence Living Arrangements: Spouse/significant other;Children Available Help at Discharge: Family Type of Home: House Home Access: Stairs to enter Entrance Stairs-Rails: Psychiatric nurse of Steps: 6 (front door) Home Layout: Two level;Able to live on main level with bedroom/bathroom Home Equipment: Hand held shower head;Grab bars - tub/shower;Shower seat;Cane - single point;Walker - 2 wheels      Prior Function Level of Independence: Independent with assistive device(s)         Comments: Pt's son has Down Syndrome, pt helps him get around  and get his food. He has a Field seismologist M-F,  then nephew comes on the weekend     Hand Dominance   Dominant Hand: Right    Extremity/Trunk Assessment   Upper Extremity Assessment Upper Extremity Assessment: Generalized weakness    Lower Extremity Assessment Lower Extremity Assessment: Generalized weakness(RLE and LLE strength symmetric, 3+/5 throughout)    Cervical / Trunk Assessment Cervical / Trunk Assessment: Kyphotic  Communication   Communication: No difficulties  Cognition Arousal/Alertness: Awake/alert Behavior During Therapy: WFL for tasks assessed/performed Overall Cognitive Status: No family/caregiver present to determine baseline cognitive functioning                                 General Comments: pt is a Poor historian and uncertain of accuracy of info he reports. Pt states that he and his wife have been married for 84 years but that is wife is 73 yeard old      General Comments      Exercises     Assessment/Plan    PT Assessment Patient needs continued PT services  PT Problem List Decreased strength;Decreased mobility;Decreased safety awareness;Decreased range of motion;Decreased activity tolerance;Decreased balance;Decreased knowledge of use of DME;Pain       PT Treatment Interventions DME instruction;Therapeutic activities;Gait training;Therapeutic exercise;Patient/family education;Balance training;Stair training;Functional mobility training    PT Goals (Current goals can be found in the Care Plan section)  Acute Rehab PT Goals Patient Stated Goal: get better, go home if possible PT Goal Formulation: With patient Time For Goal Achievement: 06/30/19 Potential to Achieve Goals: Good    Frequency Min 3X/week   Barriers to discharge        Co-evaluation PT/OT/SLP Co-Evaluation/Treatment: Yes Reason for Co-Treatment: For patient/therapist safety;To address functional/ADL transfers PT goals addressed during session: Mobility/safety with mobility;Proper use of DME OT goals  addressed during session: ADL's and self-care;Proper use of Adaptive equipment and DME       AM-PAC PT "6 Clicks" Mobility  Outcome Measure Help needed turning from your back to your side while in a flat bed without using bedrails?: A Lot Help needed moving from lying on your back to sitting on the side of a flat bed without using bedrails?: A Lot Help needed moving to and from a bed to a chair (including a wheelchair)?: A Lot Help needed standing up from a chair using your arms (e.g., wheelchair or bedside chair)?: A Lot Help needed to walk in hospital room?: Total Help needed climbing 3-5 steps with a railing? : Total 6 Click Score: 10    End of Session Equipment Utilized During Treatment: Back brace Activity Tolerance: Patient limited by fatigue Patient left: in chair;with chair alarm set;with call bell/phone within reach Nurse Communication: Mobility status PT Visit Diagnosis: Other abnormalities of gait and mobility (R26.89);Muscle weakness (generalized) (M62.81)    Time: [id number] PT Time Calculation (min) (ACUTE ONLY): 29 min   Charges:   PT Evaluation $PT Eval Low Complexity: 1 Low          Daimon Kean Conception Chancy, PT Acute Rehabilitation Services Pager [phone]  Office [phone]   Zyion Leidner D Ac Colan 06/16/2019, 2:01 PM

## 2019-06-16 NOTE — Evaluation (Signed)
Occupational Therapy Evaluation Patient Details Name: [name] MRN: [id number] DOB: [birth year] Today's Date: 06/16/2019    History of Present Illness 85 year old male with history of prostate cancer in remission, hypothyroidism, B12 deficiency, anemia, hypertension, urinary incontinence on oxybutynin presented with lower extremity weakness along with fall.   Clinical Impression   Pt with decline in function and safety with ADLs and ADL mobility with impaired strength, balance, endurance and cognition/poor safety awareness. Pt reports that PTA, he lived at home with his wife and son who has Down Syndrome. Pt reports that he and his wife independent with ADLs/selfcare and used RW for mobility. Pt reports that for son Down Syndrome, pt helps him get around and get his food. He has a Field seismologist M-F for his son, then nephew comes on the weekend. Pt with some confusion, providing inaccurate answers to some questions, so uncertain if PLOF provided is accurate. Pt currently requires max - total A with LB ADLs and toileting, mod A + 2 for sit - stand and Poor balance requiring heavy assist to take 2-3 steps with RW and very unsteady with posterior and left leaning. Pt would like to return home, however recommend [facility] SNF for rehab unless pt can have 24/7 assist as well as max HH therapies. Pt would benefit from acute OT service to address impairments to maximize level of function and safety    Follow Up Recommendations  SNF;Supervision/Assistance - 24 hour    Equipment Recommendations  None recommended by OT    Recommendations for Other Services       Precautions / Restrictions Precautions Precautions: Fall;Back Precaution Booklet Issued: Yes (comment) Precaution Comments: pt edcuated on back precautions with handout provided Required Braces or Orthoses: Spinal Brace Spinal Brace: Thoracolumbosacral orthotic Restrictions Weight Bearing Restrictions: No      Mobility Bed Mobility                General bed mobility comments: pt in recliner uon arrival  Transfers Overall transfer level: Needs assistance Equipment used: Rolling walker (2 wheeled) Transfers: Sit to/from Stand Sit to Stand: Mod assist;+2 physical assistance         General transfer comment: Pt required verbal and physical cues for correct hand placement/technique for sit - stand. Pt with posterior and L leaning at RW, very unsteady. Able to ambulate 2-3 steps with RW with heavy assist for balance/support    Balance Overall balance assessment: Needs assistance   Sitting balance-Leahy Scale: Fair   Postural control: Posterior lean;Left lateral lean Standing balance support: Bilateral upper extremity supported;During functional activity Standing balance-Leahy Scale: Poor                             ADL either performed or assessed with clinical judgement   ADL Overall ADL's : Needs assistance/impaired Eating/Feeding: Independent;Sitting   Grooming: Wash/dry hands;Wash/dry face;Min guard;Sitting   Upper Body Bathing: Min guard;Sitting   Lower Body Bathing: Maximal assistance   Upper Body Dressing : Min guard;Sitting   Lower Body Dressing: Total assistance   Toilet Transfer: Moderate assistance;+2 for physical assistance;Cueing for safety;Cueing for sequencing;RW;Ambulation Toilet Transfer Details (indicate cue type and reason): simulated to recliner Toileting- Clothing Manipulation and Hygiene: Total assistance       Functional mobility during ADLs: Moderate assistance;+2 for physical assistance;Cueing for safety;Cueing for sequencing;Rolling walker       Vision Baseline Vision/History: Wears glasses Patient Visual Report: No change from baseline  Perception     Praxis      Pertinent Vitals/Pain Pain Assessment: Faces Faces Pain Scale: Hurts a little bit Pain Location: R leg Pain Descriptors / Indicators: Cramping Pain Intervention(s): Limited  activity within patient's tolerance;Monitored during session;Repositioned     Hand Dominance Right   Extremity/Trunk Assessment Upper Extremity Assessment Upper Extremity Assessment: Generalized weakness   Lower Extremity Assessment Lower Extremity Assessment: Defer to PT evaluation   Cervical / Trunk Assessment Cervical / Trunk Assessment: Kyphotic   Communication Communication Communication: No difficulties   Cognition Arousal/Alertness: Awake/alert Behavior During Therapy: WFL for tasks assessed/performed Overall Cognitive Status: No family/caregiver present to determine baseline cognitive functioning                                 General Comments: pt is a Poor historian and uncertain of accuracy of info he reports. Pt states that he and his wife have been married for 28 years but that is wife is 83 yeard old   General Comments       Exercises     Shoulder Instructions      Home Living Family/patient expects to be discharged to:: Private residence Living Arrangements: Spouse/significant other;Children Available Help at Discharge: Family Type of Home: House Home Access: Stairs to enter [employer] of Steps: 6 (front door) Entrance Stairs-Rails: Right;Left Home Layout: Two level;Able to live on main level with bedroom/bathroom Alternate Level Stairs-Number of Steps: flight   Bathroom Shower/Tub: Occupational psychologist: Standard     Home Equipment: Hand held shower head;Grab bars - tub/shower;Shower seat;Cane - single point;Walker - 2 wheels          Prior Functioning/Environment Level of Independence: Independent with assistive device(s)        Comments: Pt's son has Down Syndrome, pt helps him get around and get his food. He has a Field seismologist M-F, then nephew comes on the weekend        OT Problem List: Decreased strength;Impaired balance (sitting and/or standing);Decreased cognition;Decreased knowledge of  precautions;Pain;Decreased safety awareness;Decreased activity tolerance;Decreased coordination;Decreased knowledge of use of DME or AE      OT Treatment/Interventions: Self-care/ADL training;DME and/or AE instruction;Therapeutic activities;Balance training;Therapeutic exercise;Patient/family education    OT Goals(Current goals can be found in the care plan section) Acute Rehab OT Goals Patient Stated Goal: get better OT Goal Formulation: With patient Time For Goal Achievement: 06/30/19 Potential to Achieve Goals: Good ADL Goals Pt Will Perform Grooming: with set-up;with supervision;sitting Pt Will Perform Upper Body Bathing: with supervision;with set-up;sitting Pt Will Perform Lower Body Bathing: with mod assist;sitting/lateral leans Pt Will Perform Upper Body Dressing: with supervision;with set-up;sitting Pt Will Transfer to Toilet: with mod assist;with min assist;ambulating;bedside commode;stand pivot transfer Pt Will Perform Toileting - Clothing Manipulation and hygiene: with mod assist;sit to/from stand  OT Frequency: Min 2X/week   Barriers to D/C: Decreased caregiver support          Co-evaluation PT/OT/SLP Co-Evaluation/Treatment: Yes Reason for Co-Treatment: For patient/therapist safety;To address functional/ADL transfers   OT goals addressed during session: ADL's and self-care;Proper use of Adaptive equipment and DME      AM-PAC OT "6 Clicks" Daily Activity     Outcome Measure Help from another person eating meals?: None Help from another person taking care of personal grooming?: A Little Help from another person toileting, which includes using toliet, bedpan, or urinal?: Total Help from another person bathing (including washing,  rinsing, drying)?: Total Help from another person to put on and taking off regular upper body clothing?: A Little Help from another person to put on and taking off regular lower body clothing?: Total 6 Click Score: 13   End of Session  Equipment Utilized During Treatment: Gait belt;Rolling walker  Activity Tolerance: Patient tolerated treatment well Patient left: in chair;with call bell/phone within reach;with chair alarm set  OT Visit Diagnosis: Unsteadiness on feet (R26.81);Other abnormalities of gait and mobility (R26.89);Muscle weakness (generalized) (M62.81);History of falling (Z91.81);Other symptoms and signs involving cognitive function;Pain Pain - Right/Left: Right Pain - part of body: Leg                Time: [id number] OT Time Calculation (min): 28 min Charges:  OT General Charges $OT Visit: 1 Visit OT Evaluation $OT Eval Moderate Complexity: 1 Mod    Britt Bottom 06/16/2019, 12:43 PM

## 2019-06-16 NOTE — Progress Notes (Addendum)
Patient ID: [name], male   DOB: 09-10-1919, 85 y.o.   MRN: [id number]  PROGRESS NOTE    [name]  [id number] DOB: [birth year] DOA: 06/14/2019 PCP: Abner Greenspan, MD   Brief Narrative:  85 year old male with history of prostate cancer in remission, hypothyroidism, B12 deficiency, anemia, hypertension, urinary incontinence on oxybutynin presented with lower extremity weakness along with fall.  On presentation, lumbar spine x-ray showed nothing acute.  CT head was unremarkable.  Neurology was consulted and recommended MRI of the T-spine and L-spine.  COVID-19 testing was negative.  Assessment & Plan:    Acute T9 fracture -Prior hospitalist spoke to neurosurgeon on-call who recommended conservative management with TLSO brace.  Outpatient follow-up with neurosurgery as needed  Bilateral lower extremity weakness Fall -Probably from above -Neurology evaluation appreciated. -PT/OT eval.  Might need placement.  Hypothyroidism -On Synthroid  B12 deficiency -Continue replacement  History of prostate cancer intervention -Continue oxybutynin  History of hypertension -Blood pressure on the higher side.  Might have to start oral antihypertensive medication  Abnormal chest x-ray showing possibility of pneumonia -Procalcitonin negative.  No worsening cough.  Will hold off on antibiotics.  Generalized conditioning -Overall prognosis is guarded to poor.  Still full code.  We will request palliative care consultation.   DVT prophylaxis: Start Lovenox Code Status: Full Family Communication: Spoke to daughter/[name] on phone on 06/16/2019 Disposition Plan: Depends on PT evaluation  Consultants: Neurology.  Prior hospitalist discussed with neurosurgery on phone  Procedures: None  Antimicrobials:  None  Subjective: Patient seen and examined at bedside.  She does not feel well.  Poor historian.  Slightly confused to place and does not remember where he  is.  Objective: Vitals:   06/15/19 1656 06/15/19 1705 06/15/19 1708 06/16/19 0525  BP: (!) 171/58 (!) 162/77 (!) 180/68 (!) 170/72  Pulse: (!) 48 64 (!) 53 (!) 53  Resp: 16     Temp: 97.9 F (36.6 C)   98.4 F (36.9 C)  TempSrc:    Oral  SpO2: 100% 100%  94%    Intake/Output Summary (Last 24 hours) at 06/16/2019 1045 Last data filed at 06/16/2019 0314 Gross per 24 hour  Intake --  Output 150 ml  Net -150 ml   There were no vitals filed for this visit.  Examination:  General exam: Elderly male lying in bed.  Awake but slightly confused to place Respiratory system: Bilateral decreased breath sounds at bases Cardiovascular system: S1 & S2 heard, intermittent bradycardia. Gastrointestinal system: Abdomen is nondistended, soft and nontender. Normal bowel sounds heard. Extremities: No cyanosis, clubbing, edema    Data Reviewed: I have personally reviewed following labs and imaging studies  CBC: Recent Labs  Lab 06/14/19 1719  WBC 8.7  HGB 11.8*  HCT 34.2*  MCV 96.9  PLT [id number]   Basic Metabolic Panel: Recent Labs  Lab 06/14/19 1719  NA 135  K 4.3  CL 100  CO2 25  GLUCOSE 113*  BUN 28*  CREATININE 1.16  CALCIUM 9.3   GFR: Estimated Creatinine Clearance: 35 mL/min (by C-G formula based on SCr of 1.16 mg/dL). Liver Function Tests: No results for input(s): AST, ALT, ALKPHOS, BILITOT, PROT, ALBUMIN in the last 168 hours. No results for input(s): LIPASE, AMYLASE in the last 168 hours. No results for input(s): AMMONIA in the last 168 hours. Coagulation Profile: No results for input(s): INR, PROTIME in the last 168 hours. Cardiac Enzymes: No results for input(s): CKTOTAL, CKMB, CKMBINDEX, TROPONINI in the  last 168 hours. BNP (last 3 results) No results for input(s): PROBNP in the last 8760 hours. HbA1C: No results for input(s): HGBA1C in the last 72 hours. CBG: No results for input(s): GLUCAP in the last 168 hours. Lipid Profile: No results for input(s): CHOL,  HDL, LDLCALC, TRIG, CHOLHDL, LDLDIRECT in the last 72 hours. Thyroid Function Tests: Recent Labs    06/14/19 2341  TSH 2.061   Anemia Panel: Recent Labs    06/14/19 2341  VITAMINB12 1,445*  FOLATE 26.3  FERRITIN 131  TIBC 283  IRON 58  RETICCTPCT 1.2   Sepsis Labs: Recent Labs  Lab 06/14/19 0624 06/16/19 0229  PROCALCITON <0.10 <0.10    Recent Results (from the past 240 hour(s))  Urine Culture     Status: Abnormal   Collection Time: 06/07/19  4:28 PM   Specimen: Urine, Random  Result Value Ref Range Status   Specimen Description URINE, RANDOM  Final   Special Requests   Final    NONE Performed at [facility], [place] [address] [place], [place] [address]    Culture MULTIPLE SPECIES PRESENT, SUGGEST RECOLLECTION (A)  Final   Report Status 06/08/2019 FINAL  Final  SARS CORONAVIRUS 2 (TAT 6-24 HRS) Nasopharyngeal Nasopharyngeal Swab     Status: None   Collection Time: 06/14/19  9:41 PM   Specimen: Nasopharyngeal Swab  Result Value Ref Range Status   SARS Coronavirus 2 NEGATIVE NEGATIVE Final    Comment: (NOTE) SARS-CoV-2 target nucleic acids are NOT DETECTED. The SARS-CoV-2 RNA is generally detectable in upper and lower respiratory specimens during the acute phase of infection. Negative results do not preclude SARS-CoV-2 infection, do not rule out co-infections with other pathogens, and should not be used as the sole basis for treatment or other patient management decisions. Negative results must be combined with clinical observations, patient history, and epidemiological information. The expected result is Negative. Fact Sheet for Patients: SugarRoll.be Fact Sheet for Healthcare Providers: https://www.woods-mathews.com/ This test is not yet approved or cleared by the [place] FDA and  has been authorized for detection and/or diagnosis of SARS-CoV-2 by FDA under an Emergency Use Authorization (EUA). This EUA will  remain  in effect (meaning this test can be used) for the duration of the COVID-19 declaration under Section 56 4(b)(1) of the Act, 21 U.S.C. section 360bbb-3(b)(1), unless the authorization is terminated or revoked sooner. Performed at [facility], [place] [address], [place], [place] [address]          Radiology Studies: Dg Chest 2 View  Result Date: 06/14/2019 CLINICAL DATA:  85 year old presenting with generalized weakness. EXAM: CHEST - 2 VIEW COMPARISON:  06/07/2019 and earlier. FINDINGS: Suboptimal inspiration accounts for crowded bronchovascular markings, especially in the bases, and accentuates the cardiac silhouette. Taking this into account, cardiac silhouette upper normal in size, unchanged. Streaky and patchy airspace opacities in the lung bases, LEFT greater than RIGHT, new since the prior examination. Nodular opacities projecting over the RIGHT lung, unchanged. No visible pleural effusions. IMPRESSION: 1. Suboptimal inspiration. Atelectasis and/or bronchopneumonia involving the lung bases, LEFT greater than RIGHT. 2. Nodular opacities projecting over the RIGHT lung as seen on the most recent prior examination. Electronically Signed   By: Evangeline Dakin M.D.   On: 06/14/2019 19:40   Dg Lumbar Spine Complete  Result Date: 06/14/2019 CLINICAL DATA:  85 year old presenting with generalized weakness and low back pain radiating into both LOWER extremities that began yesterday. EXAM: LUMBAR SPINE - COMPLETE 4+ VIEW  COMPARISON:  05/02/2014. FINDINGS: Five non-rib-bearing lumbar vertebrae with anatomic posterior alignment. No acute fractures. Remote compression fractures of T12 and L1, unchanged since the prior examination. Severe disc space narrowing at L4-5, moderate disc space narrowing at L1-2 and L2-3, and mild disc space narrowing at L3-4; the narrowing at L4-5 has progressed since the prior examination, though the remaining levels are stable. Diffuse facet degenerative  changes. No pars defects. DISH involving the lower thoracic spine. Aorto-iliac atherosclerosis without evidence of aneurysm. IMPRESSION: 1. No acute osseous abnormality. 2. Stable remote compression fractures of T12 and L1. 3. Multilevel degenerative disc disease in spondylosis, worst at L4-5. The degenerative disc disease at L4-5 has worsened since 2015. The degenerative disc disease at the other levels is stable. Electronically Signed   By: Evangeline Dakin M.D.   On: 06/14/2019 19:37   Ct Head Wo Contrast  Result Date: 06/14/2019 CLINICAL DATA:  Bilateral leg weakness. EXAM: CT HEAD WITHOUT CONTRAST TECHNIQUE: Contiguous axial images were obtained from the base of the skull through the vertex without intravenous contrast. COMPARISON:  CT head dated June 07, 2019. FINDINGS: Brain: No evidence of acute infarction, hemorrhage, hydrocephalus, extra-axial collection or mass lesion/mass effect. Stable moderate atrophy and chronic microvascular ischemic changes. Vascular: Atherosclerotic vascular calcification of the carotid siphons. No hyperdense vessel. Skull: Negative for fracture or focal lesion. Sinuses/Orbits: No acute finding. Other: None. IMPRESSION: 1.  No acute intracranial abnormality. 2. Stable moderate atrophy and chronic microvascular ischemic changes. Electronically Signed   By: Titus Dubin M.D.   On: 06/14/2019 20:11   Mr Thoracic Spine Wo Contrast  Result Date: 06/15/2019 CLINICAL DATA:  One day history of bilateral lower extremity weakness EXAM: MRI THORACIC and lumbar SPINE WITHOUT CONTRAST TECHNIQUE: Multiplanar, multisequence MR imaging of the thoracic and lumbar spine was performed. No intravenous contrast was administered. COMPARISON:  None. FINDINGS: Alignment: Are slightly exaggerated kyphosis of the upper thoracic spine. Vertebrae: There is a acute/subacute fracture of the anterior T9 vertebral body extending through the bridging anterior osteophyte. There is surrounding marrow  edema. A small amount of fluid is seen within the fracture cleft. There is prevertebral soft tissue swelling and small prevertebral hematoma seen at the T9 level. The fracture line does appear to extend through the posterior aspect of the inferior endplate at T9. No retropulsion of fragments is seen. No epidural collection is noted. There is increased marrow signal seen within the T10 vertebral body and inferior T8 vertebral body. Chronic compression deformities of T12 and L1 are seen with approximately 50-75% loss in vertebral body height. Bridging anterior osteophytes are noted from T4 through L1. Cord: Normal in signal and morphology. Paraspinal and other soft tissues: The posterior ligamentous complex does appear to however be intact. Normal appearance to the paraspinal soft tissues and retroperitoneum. Disc levels: Multilevel mild disc height loss and facet arthrosis is seen throughout the thoracic spine. No significant canal stenosis is seen. Lumbar spine: Segmentation: There are 5 non-rib bearing lumbar type vertebral bodies with the last intervertebral disc space labeled as L5-S1. Alignment: There is a minimal retrolisthesis of T12 on L1, L1 on L2 and L2 on L3. Vertebrae: Chronic superior compression deformities of T12 and L1 are seen with 57% loss in height. No retropulsion of fragments is seen. There is a buckling of the posterior cortex of T12. Conus medullaris and cauda equina: Conus extends to the L1 level. Conus and cauda equina appear normal. Paraspinal and other soft tissues: the paraspinal soft tissues and visualized retroperitoneal  structures are unremarkable. The sacroiliac joints are intact. Disc levels: Multilevel disc degenerative changes are seen this is most notable at L2-L3 with mild central canal stenosis and severe bilateral neural foraminal stenosis. IMPRESSION: 1. Acute/subacute fracture of the T9 vertebral body through the anterior bridging osteophyte and extending to the posterior  inferior vertebral body cortex with fluid within the anterior fracture cleft. Prevertebral soft tissue swelling and small hematoma. No retropulsion of fragments. The posterior ligamentous complex is intact and no cord stenosis. 2. Marrow edema within the T8 and T10 vertebral bodies. 3. Chronic compression deformities of T12 and L1. 4. Findings of DISH involving the thoracolumbar spine These results were called by telephone at the time of interpretation on 06/15/2019 at 2:35 am to provider [facility] , who verbally acknowledged these results. Electronically Signed   By: Prudencio Pair M.D.   On: 06/15/2019 02:36   Mr Lumbar Spine Wo Contrast  Result Date: 06/15/2019 CLINICAL DATA:  One day history of bilateral lower extremity weakness EXAM: MRI THORACIC and lumbar SPINE WITHOUT CONTRAST TECHNIQUE: Multiplanar, multisequence MR imaging of the thoracic and lumbar spine was performed. No intravenous contrast was administered. COMPARISON:  None. FINDINGS: Alignment: Are slightly exaggerated kyphosis of the upper thoracic spine. Vertebrae: There is a acute/subacute fracture of the anterior T9 vertebral body extending through the bridging anterior osteophyte. There is surrounding marrow edema. A small amount of fluid is seen within the fracture cleft. There is prevertebral soft tissue swelling and small prevertebral hematoma seen at the T9 level. The fracture line does appear to extend through the posterior aspect of the inferior endplate at T9. No retropulsion of fragments is seen. No epidural collection is noted. There is increased marrow signal seen within the T10 vertebral body and inferior T8 vertebral body. Chronic compression deformities of T12 and L1 are seen with approximately 50-75% loss in vertebral body height. Bridging anterior osteophytes are noted from T4 through L1. Cord: Normal in signal and morphology. Paraspinal and other soft tissues: The posterior ligamentous complex does appear to however be  intact. Normal appearance to the paraspinal soft tissues and retroperitoneum. Disc levels: Multilevel mild disc height loss and facet arthrosis is seen throughout the thoracic spine. No significant canal stenosis is seen. Lumbar spine: Segmentation: There are 5 non-rib bearing lumbar type vertebral bodies with the last intervertebral disc space labeled as L5-S1. Alignment: There is a minimal retrolisthesis of T12 on L1, L1 on L2 and L2 on L3. Vertebrae: Chronic superior compression deformities of T12 and L1 are seen with 57% loss in height. No retropulsion of fragments is seen. There is a buckling of the posterior cortex of T12. Conus medullaris and cauda equina: Conus extends to the L1 level. Conus and cauda equina appear normal. Paraspinal and other soft tissues: the paraspinal soft tissues and visualized retroperitoneal structures are unremarkable. The sacroiliac joints are intact. Disc levels: Multilevel disc degenerative changes are seen this is most notable at L2-L3 with mild central canal stenosis and severe bilateral neural foraminal stenosis. IMPRESSION: 1. Acute/subacute fracture of the T9 vertebral body through the anterior bridging osteophyte and extending to the posterior inferior vertebral body cortex with fluid within the anterior fracture cleft. Prevertebral soft tissue swelling and small hematoma. No retropulsion of fragments. The posterior ligamentous complex is intact and no cord stenosis. 2. Marrow edema within the T8 and T10 vertebral bodies. 3. Chronic compression deformities of T12 and L1. 4. Findings of DISH involving the thoracolumbar spine These results were called by telephone  at the time of interpretation on 06/15/2019 at 2:35 am to provider [facility] , who verbally acknowledged these results. Electronically Signed   By: Prudencio Pair M.D.   On: 06/15/2019 02:36        Scheduled Meds:  aspirin EC  81 mg Oral 3 times weekly   feeding supplement (ENSURE ENLIVE)  237 mL Oral  BID BM   levothyroxine  25 mcg Oral QAC breakfast   oxybutynin  5 mg Oral QODAY   sodium chloride flush  3 mL Intravenous Once   vitamin B-12  100 mcg Oral QODAY   Continuous Infusions:        Aline August, MD [facility] 06/16/2019, 10:45 AM

## 2019-06-16 NOTE — Progress Notes (Deleted)
Patient ID: [name], male   DOB: 1920/07/27, 85 y.o.   MRN: [id number]  PROGRESS NOTE    PHILLIP TOOPS  [id number] DOB: 1920-02-28 DOA: 06/14/2019 PCP: Abner Greenspan, MD   Brief Narrative:  85 year old male with history of prostate cancer in remission, hypothyroidism, B12 deficiency, anemia, hypertension, urinary incontinence on oxybutynin presented with lower extremity weakness along with fall.  On presentation, lumbar spine x-ray showed nothing acute.  CT head was unremarkable.  Neurology was consulted and recommended MRI of the T-spine and L-spine.  COVID-19 testing was negative.  Assessment & Plan:    Acute T9 fracture -Prior hospitalist spoke to neurosurgeon on-call who recommended conservative management with TLSO brace.  Outpatient follow-up with neurosurgery as needed  Bilateral lower extremity weakness Fall -Probably from above -Neurology evaluation appreciated. -PT/OT eval.  Might need placement.  Hypothyroidism -On Synthroid  B12 deficiency -Continue replacement  History of prostate cancer intervention -Continue oxybutynin  History of hypertension -Blood pressure on the higher side.  Might have to start oral antihypertensive medication  Abnormal chest x-ray showing possibility of pneumonia -Procalcitonin negative.  No worsening cough.  Will hold off on antibiotics.  Generalized conditioning -Overall prognosis is guarded to poor.  Still full code.  We will request palliative care consultation.   DVT prophylaxis: Start Lovenox Code Status: Full Family Communication: Spoke to daughter/ [name] on phone on 06/15/2019. Disposition Plan: Depends on PT evaluation  Consultants: Neurology.  Prior hospitalist discussed with neurosurgery on phone  Procedures: None  Antimicrobials:  None  Subjective: Patient seen and examined at bedside.  She does not feel well.  Poor historian.  Slightly confused to place and does not remember where he  is.  Objective: Vitals:   06/15/19 1656 06/15/19 1705 06/15/19 1708 06/16/19 0525  BP: (!) 171/58 (!) 162/77 (!) 180/68 (!) 170/72  Pulse: (!) 48 64 (!) 53 (!) 53  Resp: 16     Temp: 97.9 F (36.6 C)   98.4 F (36.9 C)  TempSrc:    Oral  SpO2: 100% 100%  94%    Intake/Output Summary (Last 24 hours) at 06/16/2019 1133 Last data filed at 06/16/2019 0314 Gross per 24 hour  Intake --  Output 150 ml  Net -150 ml   There were no vitals filed for this visit.  Examination:  General exam: Elderly male lying in bed.  Awake but slightly confused to place Respiratory system: Bilateral decreased breath sounds at bases Cardiovascular system: S1 & S2 heard, intermittent bradycardia. Gastrointestinal system: Abdomen is nondistended, soft and nontender. Normal bowel sounds heard. Extremities: No cyanosis, clubbing, edema    Data Reviewed: I have personally reviewed following labs and imaging studies  CBC: Recent Labs  Lab 06/14/19 1719  WBC 8.7  HGB 11.8*  HCT 34.2*  MCV 96.9  PLT [id number]   Basic Metabolic Panel: Recent Labs  Lab 06/14/19 1719  NA 135  K 4.3  CL 100  CO2 25  GLUCOSE 113*  BUN 28*  CREATININE 1.16  CALCIUM 9.3   GFR: Estimated Creatinine Clearance: 35 mL/min (by C-G formula based on SCr of 1.16 mg/dL). Liver Function Tests: No results for input(s): AST, ALT, ALKPHOS, BILITOT, PROT, ALBUMIN in the last 168 hours. No results for input(s): LIPASE, AMYLASE in the last 168 hours. No results for input(s): AMMONIA in the last 168 hours. Coagulation Profile: No results for input(s): INR, PROTIME in the last 168 hours. Cardiac Enzymes: No results for input(s): CKTOTAL, CKMB, CKMBINDEX, TROPONINI in  the last 168 hours. BNP (last 3 results) No results for input(s): PROBNP in the last 8760 hours. HbA1C: No results for input(s): HGBA1C in the last 72 hours. CBG: No results for input(s): GLUCAP in the last 168 hours. Lipid Profile: No results for input(s): CHOL,  HDL, LDLCALC, TRIG, CHOLHDL, LDLDIRECT in the last 72 hours. Thyroid Function Tests: Recent Labs    06/14/19 2341  TSH 2.061   Anemia Panel: Recent Labs    06/14/19 2341  VITAMINB12 1,445*  FOLATE 26.3  FERRITIN 131  TIBC 283  IRON 58  RETICCTPCT 1.2   Sepsis Labs: Recent Labs  Lab 06/14/19 0624 06/16/19 0229  PROCALCITON <0.10 <0.10    Recent Results (from the past 240 hour(s))  Urine Culture     Status: Abnormal   Collection Time: 06/07/19  4:28 PM   Specimen: Urine, Random  Result Value Ref Range Status   Specimen Description URINE, RANDOM  Final   Special Requests   Final    NONE Performed at [facility], [place] [address], [place], [place] [address]    Culture MULTIPLE SPECIES PRESENT, SUGGEST RECOLLECTION (A)  Final   Report Status 06/08/2019 FINAL  Final  SARS CORONAVIRUS 2 (TAT 6-24 HRS) Nasopharyngeal Nasopharyngeal Swab     Status: None   Collection Time: 06/14/19  9:41 PM   Specimen: Nasopharyngeal Swab  Result Value Ref Range Status   SARS Coronavirus 2 NEGATIVE NEGATIVE Final    Comment: (NOTE) SARS-CoV-2 target nucleic acids are NOT DETECTED. The SARS-CoV-2 RNA is generally detectable in upper and lower respiratory specimens during the acute phase of infection. Negative results do not preclude SARS-CoV-2 infection, do not rule out co-infections with other pathogens, and should not be used as the sole basis for treatment or other patient management decisions. Negative results must be combined with clinical observations, patient history, and epidemiological information. The expected result is Negative. Fact Sheet for Patients: SugarRoll.be Fact Sheet for Healthcare Providers: https://www.woods-mathews.com/ This test is not yet approved or cleared by the [place] FDA and  has been authorized for detection and/or diagnosis of SARS-CoV-2 by FDA under an Emergency Use Authorization (EUA). This EUA will  remain  in effect (meaning this test can be used) for the duration of the COVID-19 declaration under Section 56 4(b)(1) of the Act, 21 U.S.C. section 360bbb-3(b)(1), unless the authorization is terminated or revoked sooner. Performed at [facility], [place] [address], [place], [place] [address]          Radiology Studies: Dg Chest 2 View  Result Date: 06/14/2019 CLINICAL DATA:  85 year old presenting with generalized weakness. EXAM: CHEST - 2 VIEW COMPARISON:  06/07/2019 and earlier. FINDINGS: Suboptimal inspiration accounts for crowded bronchovascular markings, especially in the bases, and accentuates the cardiac silhouette. Taking this into account, cardiac silhouette upper normal in size, unchanged. Streaky and patchy airspace opacities in the lung bases, LEFT greater than RIGHT, new since the prior examination. Nodular opacities projecting over the RIGHT lung, unchanged. No visible pleural effusions. IMPRESSION: 1. Suboptimal inspiration. Atelectasis and/or bronchopneumonia involving the lung bases, LEFT greater than RIGHT. 2. Nodular opacities projecting over the RIGHT lung as seen on the most recent prior examination. Electronically Signed   By: Evangeline Dakin M.D.   On: 06/14/2019 19:40   Dg Lumbar Spine Complete  Result Date: 06/14/2019 CLINICAL DATA:  85 year old presenting with generalized weakness and low back pain radiating into both LOWER extremities that began yesterday. EXAM: LUMBAR SPINE - COMPLETE 4+  VIEW COMPARISON:  05/02/2014. FINDINGS: Five non-rib-bearing lumbar vertebrae with anatomic posterior alignment. No acute fractures. Remote compression fractures of T12 and L1, unchanged since the prior examination. Severe disc space narrowing at L4-5, moderate disc space narrowing at L1-2 and L2-3, and mild disc space narrowing at L3-4; the narrowing at L4-5 has progressed since the prior examination, though the remaining levels are stable. Diffuse facet degenerative  changes. No pars defects. DISH involving the lower thoracic spine. Aorto-iliac atherosclerosis without evidence of aneurysm. IMPRESSION: 1. No acute osseous abnormality. 2. Stable remote compression fractures of T12 and L1. 3. Multilevel degenerative disc disease in spondylosis, worst at L4-5. The degenerative disc disease at L4-5 has worsened since 2015. The degenerative disc disease at the other levels is stable. Electronically Signed   By: Evangeline Dakin M.D.   On: 06/14/2019 19:37   Ct Head Wo Contrast  Result Date: 06/14/2019 CLINICAL DATA:  Bilateral leg weakness. EXAM: CT HEAD WITHOUT CONTRAST TECHNIQUE: Contiguous axial images were obtained from the base of the skull through the vertex without intravenous contrast. COMPARISON:  CT head dated June 07, 2019. FINDINGS: Brain: No evidence of acute infarction, hemorrhage, hydrocephalus, extra-axial collection or mass lesion/mass effect. Stable moderate atrophy and chronic microvascular ischemic changes. Vascular: Atherosclerotic vascular calcification of the carotid siphons. No hyperdense vessel. Skull: Negative for fracture or focal lesion. Sinuses/Orbits: No acute finding. Other: None. IMPRESSION: 1.  No acute intracranial abnormality. 2. Stable moderate atrophy and chronic microvascular ischemic changes. Electronically Signed   By: Titus Dubin M.D.   On: 06/14/2019 20:11   Mr Thoracic Spine Wo Contrast  Result Date: 06/15/2019 CLINICAL DATA:  One day history of bilateral lower extremity weakness EXAM: MRI THORACIC and lumbar SPINE WITHOUT CONTRAST TECHNIQUE: Multiplanar, multisequence MR imaging of the thoracic and lumbar spine was performed. No intravenous contrast was administered. COMPARISON:  None. FINDINGS: Alignment: Are slightly exaggerated kyphosis of the upper thoracic spine. Vertebrae: There is a acute/subacute fracture of the anterior T9 vertebral body extending through the bridging anterior osteophyte. There is surrounding marrow  edema. A small amount of fluid is seen within the fracture cleft. There is prevertebral soft tissue swelling and small prevertebral hematoma seen at the T9 level. The fracture line does appear to extend through the posterior aspect of the inferior endplate at T9. No retropulsion of fragments is seen. No epidural collection is noted. There is increased marrow signal seen within the T10 vertebral body and inferior T8 vertebral body. Chronic compression deformities of T12 and L1 are seen with approximately 50-75% loss in vertebral body height. Bridging anterior osteophytes are noted from T4 through L1. Cord: Normal in signal and morphology. Paraspinal and other soft tissues: The posterior ligamentous complex does appear to however be intact. Normal appearance to the paraspinal soft tissues and retroperitoneum. Disc levels: Multilevel mild disc height loss and facet arthrosis is seen throughout the thoracic spine. No significant canal stenosis is seen. Lumbar spine: Segmentation: There are 5 non-rib bearing lumbar type vertebral bodies with the last intervertebral disc space labeled as L5-S1. Alignment: There is a minimal retrolisthesis of T12 on L1, L1 on L2 and L2 on L3. Vertebrae: Chronic superior compression deformities of T12 and L1 are seen with 57% loss in height. No retropulsion of fragments is seen. There is a buckling of the posterior cortex of T12. Conus medullaris and cauda equina: Conus extends to the L1 level. Conus and cauda equina appear normal. Paraspinal and other soft tissues: the paraspinal soft tissues and visualized  retroperitoneal structures are unremarkable. The sacroiliac joints are intact. Disc levels: Multilevel disc degenerative changes are seen this is most notable at L2-L3 with mild central canal stenosis and severe bilateral neural foraminal stenosis. IMPRESSION: 1. Acute/subacute fracture of the T9 vertebral body through the anterior bridging osteophyte and extending to the posterior  inferior vertebral body cortex with fluid within the anterior fracture cleft. Prevertebral soft tissue swelling and small hematoma. No retropulsion of fragments. The posterior ligamentous complex is intact and no cord stenosis. 2. Marrow edema within the T8 and T10 vertebral bodies. 3. Chronic compression deformities of T12 and L1. 4. Findings of DISH involving the thoracolumbar spine These results were called by telephone at the time of interpretation on 06/15/2019 at 2:35 am to provider [facility] , who verbally acknowledged these results. Electronically Signed   By: Prudencio Pair M.D.   On: 06/15/2019 02:36   Mr Lumbar Spine Wo Contrast  Result Date: 06/15/2019 CLINICAL DATA:  One day history of bilateral lower extremity weakness EXAM: MRI THORACIC and lumbar SPINE WITHOUT CONTRAST TECHNIQUE: Multiplanar, multisequence MR imaging of the thoracic and lumbar spine was performed. No intravenous contrast was administered. COMPARISON:  None. FINDINGS: Alignment: Are slightly exaggerated kyphosis of the upper thoracic spine. Vertebrae: There is a acute/subacute fracture of the anterior T9 vertebral body extending through the bridging anterior osteophyte. There is surrounding marrow edema. A small amount of fluid is seen within the fracture cleft. There is prevertebral soft tissue swelling and small prevertebral hematoma seen at the T9 level. The fracture line does appear to extend through the posterior aspect of the inferior endplate at T9. No retropulsion of fragments is seen. No epidural collection is noted. There is increased marrow signal seen within the T10 vertebral body and inferior T8 vertebral body. Chronic compression deformities of T12 and L1 are seen with approximately 50-75% loss in vertebral body height. Bridging anterior osteophytes are noted from T4 through L1. Cord: Normal in signal and morphology. Paraspinal and other soft tissues: The posterior ligamentous complex does appear to however be  intact. Normal appearance to the paraspinal soft tissues and retroperitoneum. Disc levels: Multilevel mild disc height loss and facet arthrosis is seen throughout the thoracic spine. No significant canal stenosis is seen. Lumbar spine: Segmentation: There are 5 non-rib bearing lumbar type vertebral bodies with the last intervertebral disc space labeled as L5-S1. Alignment: There is a minimal retrolisthesis of T12 on L1, L1 on L2 and L2 on L3. Vertebrae: Chronic superior compression deformities of T12 and L1 are seen with 57% loss in height. No retropulsion of fragments is seen. There is a buckling of the posterior cortex of T12. Conus medullaris and cauda equina: Conus extends to the L1 level. Conus and cauda equina appear normal. Paraspinal and other soft tissues: the paraspinal soft tissues and visualized retroperitoneal structures are unremarkable. The sacroiliac joints are intact. Disc levels: Multilevel disc degenerative changes are seen this is most notable at L2-L3 with mild central canal stenosis and severe bilateral neural foraminal stenosis. IMPRESSION: 1. Acute/subacute fracture of the T9 vertebral body through the anterior bridging osteophyte and extending to the posterior inferior vertebral body cortex with fluid within the anterior fracture cleft. Prevertebral soft tissue swelling and small hematoma. No retropulsion of fragments. The posterior ligamentous complex is intact and no cord stenosis. 2. Marrow edema within the T8 and T10 vertebral bodies. 3. Chronic compression deformities of T12 and L1. 4. Findings of DISH involving the thoracolumbar spine These results were called by  telephone at the time of interpretation on 06/15/2019 at 2:35 am to provider [facility] , who verbally acknowledged these results. Electronically Signed   By: Prudencio Pair M.D.   On: 06/15/2019 02:36        Scheduled Meds:  aspirin EC  81 mg Oral 3 times weekly   feeding supplement (ENSURE ENLIVE)  237 mL Oral  BID BM   levothyroxine  25 mcg Oral QAC breakfast   oxybutynin  5 mg Oral QODAY   sodium chloride flush  3 mL Intravenous Once   vitamin B-12  100 mcg Oral QODAY   Continuous Infusions:        Aline August, MD [facility] 06/16/2019, 11:33 AM

## 2019-06-16 NOTE — TOC Initial Note (Addendum)
Transition of Care [facility]) - Initial/Assessment Note    Patient Details  Name: [name] MRN: [id number] Date of Birth: 1919/12/17  Transition of Care [facility] CM/SW Contact:    Benard Halsted, LCSW Phone Number: 06/16/2019, 4:09 PM  Clinical Narrative:                 4pm-CSW received consult regarding SNF placement. Patient's daughter at bedside reports that they are really trying to avoid SNF placement due to the COVID risk and visiting restrictions. Patient resides at home with his wife and their adult son who has Down Syndrome. She is requesting home health services for the patient. She requests [facility] since her brother already uses services through them. CSW faxed referral for review (will need HH PT/OT/aide). She reported that patient has a walker at home but he would need one with wheels if appropriate. She stated that their home cannot accommodate a wheelchair and a smaller wheelchair would need to be a special order. Patient is not normally on oxygen at home. Patient's daughter also inquired whether or not palliative care would be able to follow patient at home. CSW will make referral to [facility] to inquire. Patient likely to require PTAR transport home. CSW confirmed PCP and home address.  4:53pm-[facility] unable to accept patient. CSW sent referral to [facility]. They are able to accept patient.   Expected Discharge Plan: [place] Barriers to Discharge: Continued Medical Work up   Patient Goals and CMS Choice Patient states their goals for this hospitalization and ongoing recovery are:: Return home CMS Medicare.gov Compare Post Acute Care list provided to:: Patient Represenative (must comment)(Daughter) Choice offered to / list presented to : Adult Children  Expected Discharge Plan and Services Expected Discharge Plan: [place] In-house Referral: Clinical Social Work Discharge Planning Services: CM Consult Post Acute Care Choice:  Durable Medical Equipment, Home Health Living arrangements for the past 2 months: Single Family Home                 DME Arranged: [place] rolling DME Agency: [employer]       HH Arranged: PT, OT, Nurse's Aide [place] Agency: [place] Date [place]: 06/16/19 Time [place]: 1608 Representative spoke with at [place]  Prior Living Arrangements/Services Living arrangements for the past 2 months: [place] with:: Spouse, Adult Children Patient language and need for interpreter reviewed:: Yes Do you feel safe going back to the place where you live?: Yes      Need for Family Participation in Patient Care: Yes (Comment) Care giver support system in place?: Yes (comment) Current home services: DME, Other (comment)(An aide that assists with his son with Cherlyn Cushing Syndrome) Criminal Activity/Legal Involvement Pertinent to Current Situation/Hospitalization: No - Comment as needed  Activities of Daily Living Home Assistive Devices/Equipment: Environmental consultant (specify type) ADL Screening (condition at time of admission) Patient's cognitive ability adequate to safely complete daily activities?: Yes Is the patient deaf or have difficulty hearing?: Yes Does the patient have difficulty seeing, even when wearing glasses/contacts?: No Does the patient have difficulty concentrating, remembering, or making decisions?: No Patient able to express need for assistance with ADLs?: Yes Does the patient have difficulty dressing or bathing?: Yes Independently performs ADLs?: No Communication: Independent Dressing (OT): Needs assistance Is this a change from baseline?: Change from baseline, expected to last <3days Grooming: Needs assistance Is this a change from baseline?: Change from  baseline, expected to last <3 days Feeding: Needs assistance Is this a change from baseline?: Change from baseline, expected to last <3 days Bathing: Needs assistance Is this a  change from baseline?: Change from baseline, expected to last <3 days Toileting: Needs assistance Is this a change from baseline?: Change from baseline, expected to last <3 days In/Out Bed: Needs assistance Is this a change from baseline?: Change from baseline, expected to last <3 days Walks in Home: Needs assistance Is this a change from baseline?: Pre-admission baseline Does the patient have difficulty walking or climbing stairs?: Yes Weakness of Legs: Both Weakness of Arms/Hands: Both  Permission Sought/Granted Permission sought to share information with : Facility Sport and exercise psychologist, Family Supports Permission granted to share information with : Yes, Verbal Permission Granted  Share Information with NAME: [name]  Permission granted to share info w AGENCY: [place] granted to share info w Relationship: Daughter  Permission granted to share info w Contact Information: [phone]  Emotional Assessment Appearance:: Appears stated age Attitude/Demeanor/Rapport: Unable to Assess Affect (typically observed): Unable to Assess Orientation: : Oriented to Self, Oriented to Place Alcohol / Substance Use: Not Applicable Psych Involvement: No (comment)  Admission diagnosis:  Weakness [R53.1] Patient Active Problem List   Diagnosis Date Noted  . Goals of care, counseling/discussion   . Palliative care by specialist   . Lower extremity weakness 06/14/2019  . Weakness 06/14/2019  . Hearing loss 09/14/2018  . Thickened nails 09/14/2018  . PVD (peripheral vascular disease) ([place]) 11/05/2017  . Fall at home 09/11/2017  . Poor balance 09/11/2017  . Vitamin D deficiency 08/28/2016  . Routine general medical examination at a health care facility 08/24/2015  . B12 deficiency 08/14/2014  . Low back pain 05/02/2014  . Urinary frequency 05/02/2014  . Encounter for Medicare annual wellness exam 07/27/2013  . Motion sickness 04/04/2013  . Osteopenia 06/14/2012  . Hypothyroid  06/14/2012  . 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 FECAL OCCULT BLOOD 02/01/2010  . ANEMIA, MILD 01/23/2010  . Essential hypertension 02/23/2009  . Carotid artery stenosis 02/23/2009  . SPONDYLOSIS, LUMBAR 02/23/2009  . NECK PAIN, CHRONIC 02/23/2009  . BACK PAIN, CHRONIC 02/23/2009  . H/O compression fracture of spine 02/23/2009  . HYPERCHOLESTEROLEMIA 02/21/2009  . History of prostate cancer 02/21/2009   PCP:  Abner Greenspan, MD Pharmacy:   [id number] [place], [place] [address] Phone: [phone] Fax: [phone]     Social Determinants of Health (SDOH) Interventions    Readmission Risk Interventions No flowsheet data found.

## 2019-06-16 NOTE — Progress Notes (Signed)
Initial Nutrition Assessment  DOCUMENTATION CODES:   Not applicable  INTERVENTION:   -Magic cup TID with meals, each supplement provides 290 kcal and 9 grams of protein -MVI with minerals daily -Continue Ensure Enlive po BID, each supplement provides 350 kcal and 20 grams of protein  NUTRITION DIAGNOSIS:   Inadequate oral intake related to decreased appetite as evidenced by meal completion < 50%.  GOAL:   Patient will meet greater than or equal to 90% of their needs  MONITOR:   PO intake, Supplement acceptance, Labs, Weight trends, Skin, I & O's  REASON FOR ASSESSMENT:   Malnutrition Screening Tool    ASSESSMENT:   [name] is an 85 y.o. male with history of prostate cancer in remission, hypothyroidism, B12 deficiency anemia on supplements, hypertension presently not on medication, urinary incontinence on oxybutynin had come to the ER about a week ago after patient was feeling weak and at that time patient was diagnosed with UTI and discharged home on antibiotics.  Per patient's daughter patient did well after taking antibiotics regained his strength.  Today while walking with the help of walker patient had a fall and was helped onto the floor by the healthcare worker.  Following which patient was finding it too weak to stand back on his legs.  Patient was brought to the ER.  Did not have any incontinence of urine or bowel or any pain.  Did not hit his head or lose consciousness.  Pt admitted with lower extremity weakness.   Reviewed I/O's: -150 ml x 24 hours  UOP: 150 ml x 24 hours  Pt sitting in recliner chair, talking on phone at time of visit. Unable to obtain current wt or nutrition history at this time.   Observed meal tray- pt consumed about 50% of muffin and juice. Also noted pt sipping on Ensure supplement. Noted meal completion documented at 70-80%.   Per wt hx, wt has been stable over the past 2 years. Pt with some mild to moderate depletions, however,  suspect these may be related to advanced age.   Palliative care consult completed; plan home with home health services. Pt is refusing SNF.   Labs reviewed.   NUTRITION - FOCUSED PHYSICAL EXAM:    Most Recent Value  Orbital Region  No depletion  Upper Arm Region  Moderate depletion  Thoracic and Lumbar Region  No depletion  Buccal Region  No depletion  Temple Region  Mild depletion  Clavicle Bone Region  No depletion  Clavicle and Acromion Bone Region  No depletion  Scapular Bone Region  No depletion  Dorsal Hand  Mild depletion  Patellar Region  Unable to assess  Anterior Thigh Region  Unable to assess  Posterior Calf Region  Unable to assess  Edema (RD Assessment)  None  Hair  Reviewed  Eyes  Reviewed  Mouth  Reviewed  Skin  Reviewed  Nails  Reviewed       Diet Order:   Diet Order            Diet Heart Room service appropriate? Yes; Fluid consistency: Thin  Diet effective now              EDUCATION NEEDS:   No education needs have been identified at this time  Skin:  Skin Assessment: Reviewed RN Assessment  Last BM:  06/13/19  Height:   Ht Readings from Last 1 Encounters:  06/16/19 5\' 8"  (1.727 m)    Weight:   Wt Readings from Last  1 Encounters:  06/16/19 68 kg    Ideal Body Weight:  70 kg  BMI:  Body mass index is 22.79 kg/m.  Estimated Nutritional Needs:   Kcal:  1700-1900  Protein:  70-85 grams  Fluid:  > 1.7 L    [name] A. Jimmye Norman, RD, LDN, [place] Registered Dietitian II Certified Diabetes Care and Education Specialist Pager: [phone] After hours Pager: [phone]

## 2019-06-16 NOTE — Consult Note (Signed)
Consultation Note Date: 06/16/2019   Patient Name: [name]  DOB: 1920/01/14  MRN: [id number]  Age / Sex: 85 y.o., male  PCP: Tower, Wynelle Fanny, MD Referring Physician: Aline August, MD  Reason for Consultation: Establishing goals of care  HPI/Patient Profile: 85 y.o. male  with past medical history of prostate cancer, HLD, and HTN admitted on 06/14/2019 with weakness in bilateral legs. Found to have fracture of  T9 vertebral body. Patient with generalized deconditioning so PMT consulted for [place].   Clinical Assessment and Goals of Care: I have reviewed medical records including EPIC notes, labs and imaging, and received report from RN - RN tells me patient is confused, climbed out of bed last night. Stable.   I then spoke with patient's daughter, [name], to discuss diagnosis prognosis, [place], EOL wishes, disposition and options. Please note that patient has a spouse who is decision maker however daughter is communicating with medical providers and they make decisions jointly to relieve patient's spouse of that burden.   I introduced Palliative Medicine as specialized medical care for people living with serious illness. It focuses on providing relief from the symptoms and stress of a serious illness. The goal is to improve quality of life for both the patient and the family.  [name] shares that patient lives with his 84 year old spouse and their son who is 32 years old - he has Down Syndrome and is currently under hospice care. She shares that they have multiple caregivers in the home that assist with all household needs.   As far as functional and nutritional status, jean tells me that patient is very active and fiercely independent. He uses a cane or walker for ambulation. Prior to admission he was still doing some household chores, going outside frequently, preparing all of his meals. She does shares that his falls have increased in the last month  (chart review says he falls 3-4 x/wk ,daughter tells me he falls about once a week). No concerns about appetite per daughter.    We discussed his current illness and what it means in the larger context of his on-going co-morbidities.  Natural disease trajectory and expectations at EOL were discussed. [name] tells me she has a good understanding of the situation - she has several friends and family members who are healthcare providers and they are helping her understand the situation as well. We discuss his increased confusion and likely component of hospital delirium - [name] is focused on getting patient home ASAP to help with this.   I attempted to elicit values and goals of care important to the patient.  [name] tells me the patient has advance directives that she plans to review soon. She brings up code status and shares that in any other circumstance her father would definitely be "DNR" status - however, at this time family has requested that he remain full code. Reasons for this are multiple - d/t visitor restrictions patient's wife is not at the hospital and if something emergent happened they would want attempts to keep patient alive until she could arrive - even if that meant on ventilator support. [name] explains that this is especially important right now since their son is also nearing end of life and under hospice care - patient's wife is emotionally fragile. She tells me she understands the poor outcome if patient were to require resuscitation and she understands the burden on the patient - however, she feels it would be "worth it" to her mother to attempt  resuscitation until she could see patient. She also shares that they make this decision knowing that their father is medically stable and any sort of event at this time would be unexpected. If he were acutely declining, they may make different decisions.   We discussed plan of care moving forward - family is certain they do not want SNF placement and  willing to make any accommodations necessary so that patient may return home with appropriate assistance.   [name] also shares again about their familiarity with hospice d/t her brother being in their care - she knows her father is elderly and frail and may soon qualify for their services.   Questions and concerns were addressed.The family was encouraged to call with questions or concerns.   Primary Decision Maker NEXT OF KIN -  spouse    SUMMARY OF RECOMMENDATIONS   Continue current care and full code - please see discussion above for family's reasoning Home with home health - no SNF  Code Status/Advance Care Planning:  Full code  Prognosis:   Unable to determine  Discharge Planning: Home with Home Health      Primary Diagnoses: Present on Admission: . Lower extremity weakness . PVD (peripheral vascular disease) ([place]) . Hypothyroid . B12 deficiency   I have reviewed the medical record, interviewed the patient and family, and examined the patient. The following aspects are pertinent.  Past Medical History:  Diagnosis Date  . Cancer [facility])    prostate  . Carotid artery stenosis   . Degenerative disc disease   . Fractures    compression  . History of prostate cancer   . Hyperlipidemia   . Hypertension   . Kyphosis   . Non-cardiac chest pain 04/11   hospital ? [facility]   Social History   Socioeconomic History  . Marital status: Married    Spouse name: Not on file  . Number of children: Not on file  . Years of education: Not on file  . Highest education level: Not on file  Occupational History  . 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 Financial resource strain: Not on file  . Food insecurity    Worry: Not on file    Inability: Not on file  . Transportation needs    Medical: Not on file    Non-medical: Not on file  Tobacco Use  . Smoking status: Never Smoker  . Smokeless tobacco: Never Used   Substance and Sexual Activity  . Alcohol use: Yes    Alcohol/week: 14.0 standard drinks    Types: 14 Glasses of wine per week  . Drug use: No  . Sexual activity: Never  Lifestyle  . Physical activity    Days per week: Not on file    Minutes per session: Not on file  . Stress: Not on file  Relationships  . Social Herbalist on phone: Not on file    Gets together: Not on file    Attends religious service: Not on file    Active member of club or organization: Not on file    Attends meetings of clubs or organizations: Not on file    Relationship status: Not on file  Other Topics Concern  . 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 Hypertension  Father    Scheduled Meds: . aspirin EC  81 mg Oral 3 times weekly  . enoxaparin (LOVENOX) injection  40 mg Subcutaneous Q24H  . feeding supplement (ENSURE ENLIVE)  237 mL Oral BID BM  . [START ON 06/17/2019] influenza vaccine adjuvanted  0.5 mL Intramuscular Tomorrow-1000  . levothyroxine  25 mcg Oral QAC breakfast  . oxybutynin  5 mg Oral QODAY  . sodium chloride flush  3 mL Intravenous Once  . vitamin B-12  100 mcg Oral QODAY   Continuous Infusions: PRN Meds:.acetaminophen **OR** acetaminophen, guaiFENesin, ondansetron **OR** ondansetron (ZOFRAN) IV Allergies  Allergen Reactions  . Simvastatin Other (See Comments)    Aches and pains    Vital Signs: BP (!) 159/60   Pulse 66   Temp 98.6 F (37 C) (Oral)   Resp 18   SpO2 95%  Pain Scale: 0-10   Pain Score: 0-No pain   SpO2: SpO2: 95 % O2 Device:SpO2: 95 % O2 Flow Rate: .O2 Flow Rate (L/min): 3 L/min  IO: Intake/output summary:   Intake/Output Summary (Last 24 hours) at 06/16/2019 1452 Last data filed at 06/16/2019 1416 Gross per 24 hour  Intake 420 ml  Output 150 ml  Net 270 ml    LBM: Last BM Date: 06/13/19 Baseline Weight:   Most recent weight:        Palliative Assessment/Data: PPS 50%    The above conversation was completed via telephone due to the visitor restrictions during the COVID-19 pandemic. Thorough chart review and discussion with necessary members of the care team was completed as part of assessment. All issues were discussed and addressed but no physical exam was performed  Time Total: 50 minutes Greater than 50%  of this time was spent counseling and coordinating care related to the above assessment and plan.  Juel Burrow, DNP, AGNP-C Palliative Medicine Team [phone] Pager: [phone]

## 2019-06-17 ENCOUNTER — Telehealth: Payer: Self-pay | Admitting: Family Medicine

## 2019-06-17 DIAGNOSIS — S22079B Unspecified fracture of T9-T10 vertebra, initial encounter for open fracture: Secondary | ICD-10-CM

## 2019-06-17 MED ORDER — AMLODIPINE BESYLATE 5 MG PO TABS: 5.0000 mg | ORAL_TABLET | Freq: Every day | ORAL | 0 refills | Status: DC

## 2019-06-17 MED ORDER — AMLODIPINE BESYLATE 5 MG PO TABS
5.0000 mg | ORAL_TABLET | Freq: Every day | ORAL | Status: DC
  Administered 2019-06-17: 5 mg via ORAL
  Filled 2019-06-17: qty 1

## 2019-06-17 MED ORDER — OXYBUTYNIN CHLORIDE ER 5 MG PO TB24: 5.0000 mg | ORAL_TABLET | ORAL | Status: DC

## 2019-06-17 MED ORDER — AMLODIPINE BESYLATE 5 MG PO TABS: 5.0000 mg | ORAL_TABLET | Freq: Every day | ORAL | Status: DC

## 2019-06-17 NOTE — Progress Notes (Signed)
RN paged Pt's MD as Prescribed Rx Norvasc is not shown on AVS for electronic filling and there is no physical signed paper copy. Awaiting MD response

## 2019-06-17 NOTE — Telephone Encounter (Signed)
Harmon Pier with [facility] wanted to let you know that they received a referral from the hospital on the patient.   C/B # [phone]

## 2019-06-17 NOTE — Telephone Encounter (Signed)
Harmon Pier notified of Dr. Marliss Coots comments. Harmon Pier said since it's just palliative care they shouldn't need to send her ppw unless it turns into hospice

## 2019-06-17 NOTE — Discharge Summary (Signed)
Physician Discharge Summary  [name] [id number] DOB: October 04, 1919 DOA: 06/14/2019  PCP: Abner Greenspan, MD  Admit date: 06/14/2019 Discharge date: 06/17/2019  Admitted From: Home Disposition: Home  Recommendations for Outpatient Follow-up:  1. Follow up with PCP in 1 week with repeat CBC/BMP 2. Outpatient evaluation and follow-up with palliative care 3. Outpatient follow-up with neurosurgery as needed 4. Follow up in ED if symptoms worsen or new appear   Home Health: PT/OT Equipment/Devices: TLSO brace Discharge Condition: Guarded CODE STATUS: Full Diet recommendation: Heart healthy  Brief/Interim Summary: 85 year old male with history of prostate cancer in remission, hypothyroidism, B12 deficiency, anemia, hypertension, urinary incontinence on oxybutynin presented with lower extremity weakness along with fall.  On presentation, lumbar spine x-ray showed nothing acute.  CT head was unremarkable.  Neurology was consulted and recommended MRI of the T-spine and L-spine.  COVID-19 testing was negative.  He was found to have acute T9 fracture; neurosurgery recommended TLSO brace and conservative management.  PT recommended SNF but patient and family declined.  He will be discharged home with home health PT.  Discharge Diagnoses:   Acute T9 fracture -Prior hospitalist spoke to neurosurgeon on-call who recommended conservative management with TLSO brace.  Outpatient follow-up with neurosurgery as needed  Bilateral lower extremity weakness Fall -Probably from above -Neurology outpatient follow-up with. -PT/OT recommended SNF but patient and family declined.  He will be discharged home with home health PT.   Hypothyroidism -On Synthroid  B12 deficiency -Continue replacement  History of prostate cancer intervention -Continue oxybutynin  History of hypertension -Blood pressure on the higher side.    Will start amlodipine 5 mg daily.  Outpatient follow-up.  Abnormal  chest x-ray showing possibility of pneumonia -Procalcitonin negative.  No worsening cough.  Will hold off on antibiotics.  Generalized conditioning -Overall prognosis is guarded to poor.  Still full code.  Palliative care evaluation appreciated.  Outpatient follow-up with palliative care.  Discharge Instructions  Discharge Instructions    Diet - low sodium heart healthy   Complete by: As directed    Increase activity slowly   Complete by: As directed      Allergies as of 06/17/2019      Reactions   Simvastatin Other (See Comments)   Aches and pains      Medication List    TAKE these medications   amLODipine 5 MG tablet Commonly known as: NORVASC Take 1 tablet (5 mg total) by mouth daily.   aspirin EC 81 MG tablet Take 81 mg by mouth 3 (three) times a week.   Beano Tabs Take 1 tablet by mouth as needed (before meals, to improve digestion).   cephALEXin 500 MG capsule Commonly known as: KEFLEX Take 1 capsule (500 mg total) by mouth 4 (four) times daily.   guaiFENesin 600 MG 12 hr tablet Commonly known as: MUCINEX Take 600 mg by mouth 2 (two) times daily as needed for to loosen phlegm.   levothyroxine 25 MCG tablet Commonly known as: SYNTHROID Take 1 tablet (25 mcg total) by mouth daily before breakfast.   oxybutynin 5 MG 24 hr tablet Commonly known as: DITROPAN-XL Take 1 tablet (5 mg total) by mouth See admin instructions. Take 5 mg by mouth every other night   SELENIUM PO Take 1 tablet by mouth daily with breakfast.   vitamin B-12 100 MCG tablet Commonly known as: CYANOCOBALAMIN Take 1 tablet (100 mcg total) by mouth every other day.   Vitamin D-3 25 MCG (1000 UT) Caps Take  1,000-2,000 Units by mouth daily.            Durable Medical Equipment  (From admission, onward)         Start     Ordered   06/17/19 0959  For home use only DME Walker rolling  Once    Question:  Patient needs a walker to treat with the following condition  Answer:  Weakness    06/17/19 0958          Follow-up Information    Tower, Wynelle Fanny, MD. Schedule an appointment as soon as possible for a visit in 1 week(s).   Specialties: Family Medicine, Radiology Contact information: [place] [address] [phone]          Allergies  Allergen Reactions  . Simvastatin Other (See Comments)    Aches and pains    Consultations:  Palliative care/neurology Prior hospitalist discussed with neurosurgery on phone   Procedures/Studies: Dg Chest 2 View  Result Date: 06/14/2019 CLINICAL DATA:  85 year old presenting with generalized weakness. EXAM: CHEST - 2 VIEW COMPARISON:  06/07/2019 and earlier. FINDINGS: Suboptimal inspiration accounts for crowded bronchovascular markings, especially in the bases, and accentuates the cardiac silhouette. Taking this into account, cardiac silhouette upper normal in size, unchanged. Streaky and patchy airspace opacities in the lung bases, LEFT greater than RIGHT, new since the prior examination. Nodular opacities projecting over the RIGHT lung, unchanged. No visible pleural effusions. IMPRESSION: 1. Suboptimal inspiration. Atelectasis and/or bronchopneumonia involving the lung bases, LEFT greater than RIGHT. 2. Nodular opacities projecting over the RIGHT lung as seen on the most recent prior examination. Electronically Signed   By: Evangeline Dakin M.D.   On: 06/14/2019 19:40   Dg Lumbar Spine Complete  Result Date: 06/14/2019 CLINICAL DATA:  85 year old presenting with generalized weakness and low back pain radiating into both LOWER extremities that began yesterday. EXAM: LUMBAR SPINE - COMPLETE 4+ VIEW COMPARISON:  05/02/2014. FINDINGS: Five non-rib-bearing lumbar vertebrae with anatomic posterior alignment. No acute fractures. Remote compression fractures of T12 and L1, unchanged since the prior examination. Severe disc space narrowing at L4-5, moderate disc space narrowing at L1-2 and L2-3, and mild disc  space narrowing at L3-4; the narrowing at L4-5 has progressed since the prior examination, though the remaining levels are stable. Diffuse facet degenerative changes. No pars defects. DISH involving the lower thoracic spine. Aorto-iliac atherosclerosis without evidence of aneurysm. IMPRESSION: 1. No acute osseous abnormality. 2. Stable remote compression fractures of T12 and L1. 3. Multilevel degenerative disc disease in spondylosis, worst at L4-5. The degenerative disc disease at L4-5 has worsened since 2015. The degenerative disc disease at the other levels is stable. Electronically Signed   By: Evangeline Dakin M.D.   On: 06/14/2019 19:37   Ct Head Wo Contrast  Result Date: 06/14/2019 CLINICAL DATA:  Bilateral leg weakness. EXAM: CT HEAD WITHOUT CONTRAST TECHNIQUE: Contiguous axial images were obtained from the base of the skull through the vertex without intravenous contrast. COMPARISON:  CT head dated June 07, 2019. FINDINGS: Brain: No evidence of acute infarction, hemorrhage, hydrocephalus, extra-axial collection or mass lesion/mass effect. Stable moderate atrophy and chronic microvascular ischemic changes. Vascular: Atherosclerotic vascular calcification of the carotid siphons. No hyperdense vessel. Skull: Negative for fracture or focal lesion. Sinuses/Orbits: No acute finding. Other: None. IMPRESSION: 1.  No acute intracranial abnormality. 2. Stable moderate atrophy and chronic microvascular ischemic changes. Electronically Signed   By: Titus Dubin M.D.   On: 06/14/2019 20:11  Ct Head Wo Contrast  Result Date: 06/07/2019 CLINICAL DATA:  86 year old male with altered mental status. EXAM: CT HEAD WITHOUT CONTRAST TECHNIQUE: Contiguous axial images were obtained from the base of the skull through the vertex without intravenous contrast. COMPARISON:  None. FINDINGS: Evaluation is somewhat limited due to patient's positioning. Brain: There is moderate age-related atrophy and chronic  microvascular ischemic changes. There is no acute intracranial hemorrhage. No mass effect or midline shift. No extra-axial fluid collection. Vascular: No hyperdense vessel or unexpected calcification. Skull: Normal. Negative for fracture or focal lesion. Sinuses/Orbits: No acute finding. Other: None IMPRESSION: 1. No acute intracranial hemorrhage. 2. Moderate age-related atrophy and chronic microvascular ischemic changes. Electronically Signed   By: Anner Crete M.D.   On: 06/07/2019 22:02   Mr Thoracic Spine Wo Contrast  Result Date: 06/15/2019 CLINICAL DATA:  One day history of bilateral lower extremity weakness EXAM: MRI THORACIC and lumbar SPINE WITHOUT CONTRAST TECHNIQUE: Multiplanar, multisequence MR imaging of the thoracic and lumbar spine was performed. No intravenous contrast was administered. COMPARISON:  None. FINDINGS: Alignment: Are slightly exaggerated kyphosis of the upper thoracic spine. Vertebrae: There is a acute/subacute fracture of the anterior T9 vertebral body extending through the bridging anterior osteophyte. There is surrounding marrow edema. A small amount of fluid is seen within the fracture cleft. There is prevertebral soft tissue swelling and small prevertebral hematoma seen at the T9 level. The fracture line does appear to extend through the posterior aspect of the inferior endplate at T9. No retropulsion of fragments is seen. No epidural collection is noted. There is increased marrow signal seen within the T10 vertebral body and inferior T8 vertebral body. Chronic compression deformities of T12 and L1 are seen with approximately 50-75% loss in vertebral body height. Bridging anterior osteophytes are noted from T4 through L1. Cord: Normal in signal and morphology. Paraspinal and other soft tissues: The posterior ligamentous complex does appear to however be intact. Normal appearance to the paraspinal soft tissues and retroperitoneum. Disc levels: Multilevel mild disc height loss  and facet arthrosis is seen throughout the thoracic spine. No significant canal stenosis is seen. Lumbar spine: Segmentation: There are 5 non-rib bearing lumbar type vertebral bodies with the last intervertebral disc space labeled as L5-S1. Alignment: There is a minimal retrolisthesis of T12 on L1, L1 on L2 and L2 on L3. Vertebrae: Chronic superior compression deformities of T12 and L1 are seen with 57% loss in height. No retropulsion of fragments is seen. There is a buckling of the posterior cortex of T12. Conus medullaris and cauda equina: Conus extends to the L1 level. Conus and cauda equina appear normal. Paraspinal and other soft tissues: the paraspinal soft tissues and visualized retroperitoneal structures are unremarkable. The sacroiliac joints are intact. Disc levels: Multilevel disc degenerative changes are seen this is most notable at L2-L3 with mild central canal stenosis and severe bilateral neural foraminal stenosis. IMPRESSION: 1. Acute/subacute fracture of the T9 vertebral body through the anterior bridging osteophyte and extending to the posterior inferior vertebral body cortex with fluid within the anterior fracture cleft. Prevertebral soft tissue swelling and small hematoma. No retropulsion of fragments. The posterior ligamentous complex is intact and no cord stenosis. 2. Marrow edema within the T8 and T10 vertebral bodies. 3. Chronic compression deformities of T12 and L1. 4. Findings of DISH involving the thoracolumbar spine These results were called by telephone at the time of interpretation on 06/15/2019 at 2:35 am to provider [facility] , who verbally acknowledged these results. Electronically  Signed   By: Prudencio Pair M.D.   On: 06/15/2019 02:36   Mr Lumbar Spine Wo Contrast  Result Date: 06/15/2019 CLINICAL DATA:  One day history of bilateral lower extremity weakness EXAM: MRI THORACIC and lumbar SPINE WITHOUT CONTRAST TECHNIQUE: Multiplanar, multisequence MR imaging of the thoracic  and lumbar spine was performed. No intravenous contrast was administered. COMPARISON:  None. FINDINGS: Alignment: Are slightly exaggerated kyphosis of the upper thoracic spine. Vertebrae: There is a acute/subacute fracture of the anterior T9 vertebral body extending through the bridging anterior osteophyte. There is surrounding marrow edema. A small amount of fluid is seen within the fracture cleft. There is prevertebral soft tissue swelling and small prevertebral hematoma seen at the T9 level. The fracture line does appear to extend through the posterior aspect of the inferior endplate at T9. No retropulsion of fragments is seen. No epidural collection is noted. There is increased marrow signal seen within the T10 vertebral body and inferior T8 vertebral body. Chronic compression deformities of T12 and L1 are seen with approximately 50-75% loss in vertebral body height. Bridging anterior osteophytes are noted from T4 through L1. Cord: Normal in signal and morphology. Paraspinal and other soft tissues: The posterior ligamentous complex does appear to however be intact. Normal appearance to the paraspinal soft tissues and retroperitoneum. Disc levels: Multilevel mild disc height loss and facet arthrosis is seen throughout the thoracic spine. No significant canal stenosis is seen. Lumbar spine: Segmentation: There are 5 non-rib bearing lumbar type vertebral bodies with the last intervertebral disc space labeled as L5-S1. Alignment: There is a minimal retrolisthesis of T12 on L1, L1 on L2 and L2 on L3. Vertebrae: Chronic superior compression deformities of T12 and L1 are seen with 57% loss in height. No retropulsion of fragments is seen. There is a buckling of the posterior cortex of T12. Conus medullaris and cauda equina: Conus extends to the L1 level. Conus and cauda equina appear normal. Paraspinal and other soft tissues: the paraspinal soft tissues and visualized retroperitoneal structures are unremarkable. The  sacroiliac joints are intact. Disc levels: Multilevel disc degenerative changes are seen this is most notable at L2-L3 with mild central canal stenosis and severe bilateral neural foraminal stenosis. IMPRESSION: 1. Acute/subacute fracture of the T9 vertebral body through the anterior bridging osteophyte and extending to the posterior inferior vertebral body cortex with fluid within the anterior fracture cleft. Prevertebral soft tissue swelling and small hematoma. No retropulsion of fragments. The posterior ligamentous complex is intact and no cord stenosis. 2. Marrow edema within the T8 and T10 vertebral bodies. 3. Chronic compression deformities of T12 and L1. 4. Findings of DISH involving the thoracolumbar spine These results were called by telephone at the time of interpretation on 06/15/2019 at 2:35 am to provider [facility] , who verbally acknowledged these results. Electronically Signed   By: Prudencio Pair M.D.   On: 06/15/2019 02:36   Dg Chest Port 1 View  Result Date: 06/07/2019 CLINICAL DATA:  Weakness EXAM: PORTABLE CHEST 1 VIEW COMPARISON:  12/07/2009, 06/09/2006 FINDINGS: Cardiac shadow is stable. Aortic calcifications are seen. The lungs are well aerated bilaterally. Small nodular densities are noted in the right mid lung which were not present on the prior exam. The inferior nodular density is not well appreciated on the previous exam from 2011 but was present in 2007 and corresponds to an area of bony sclerosis. It is possible that the more superior nodule is related to sclerosis in the anterior third rib. Oblique images  may be helpful. IMPRESSION: Nodular densities in the right lung as described. One of these is chronic related to the posterior right eighth rib. The more superior nodule may be related to the anterior third rib. Oblique imaging may be helpful. Electronically Signed   By: Inez Catalina M.D.   On: 06/07/2019 16:09       Subjective: Patient seen and examined at bedside.   He is a poor historian.  Wants to go home.  Denies any worsening back pain.  Discharge Exam: Vitals:   06/16/19 2100 06/17/19 0525  BP: (!) 142/64 (!) 165/65  Pulse: 63 63  Resp:    Temp: 97.6 F (36.4 C) 98.4 F (36.9 C)  SpO2: 100% 98%    General: Elderly male sitting on chair, awake.  Poor historian. Cardiovascular: rate controlled, S1/S2 + Respiratory: bilateral decreased breath sounds at bases Abdominal: Soft, NT, ND, bowel sounds + Extremities: no edema, no cyanosis    The results of significant diagnostics from this hospitalization (including imaging, microbiology, ancillary and laboratory) are listed below for reference.     Microbiology: Recent Results (from the past 240 hour(s))  Urine Culture     Status: Abnormal   Collection Time: 06/07/19  4:28 PM   Specimen: Urine, Random  Result Value Ref Range Status   Specimen Description URINE, RANDOM  Final   Special Requests   Final    NONE Performed at [facility], [address], [place], [place] [address]    Culture MULTIPLE SPECIES PRESENT, SUGGEST RECOLLECTION (A)  Final   Report Status 06/08/2019 FINAL  Final  SARS CORONAVIRUS 2 (TAT 6-24 HRS) Nasopharyngeal Nasopharyngeal Swab     Status: None   Collection Time: 06/14/19  9:41 PM   Specimen: Nasopharyngeal Swab  Result Value Ref Range Status   SARS Coronavirus 2 NEGATIVE NEGATIVE Final    Comment: (NOTE) SARS-CoV-2 target nucleic acids are NOT DETECTED. The SARS-CoV-2 RNA is generally detectable in upper and lower respiratory specimens during the acute phase of infection. Negative results do not preclude SARS-CoV-2 infection, do not rule out co-infections with other pathogens, and should not be used as the sole basis for treatment or other patient management decisions. Negative results must be combined with clinical observations, patient history, and epidemiological information. The expected result is Negative. Fact Sheet for  Patients: SugarRoll.be Fact Sheet for Healthcare Providers: https://www.woods-mathews.com/ This test is not yet approved or cleared by the [place] FDA and  has been authorized for detection and/or diagnosis of SARS-CoV-2 by FDA under an Emergency Use Authorization (EUA). This EUA will remain  in effect (meaning this test can be used) for the duration of the COVID-19 declaration under Section 56 4(b)(1) of the Act, 21 U.S.C. section 360bbb-3(b)(1), unless the authorization is terminated or revoked sooner. Performed at [facility], [place] [address] [place], [place] [address]      Labs: BNP (last 3 results) No results for input(s): BNP in the last 8760 hours. Basic Metabolic Panel: Recent Labs  Lab 06/14/19 1719  NA 135  K 4.3  CL 100  CO2 25  GLUCOSE 113*  BUN 28*  CREATININE 1.16  CALCIUM 9.3   Liver Function Tests: No results for input(s): AST, ALT, ALKPHOS, BILITOT, PROT, ALBUMIN in the last 168 hours. No results for input(s): LIPASE, AMYLASE in the last 168 hours. No results for input(s): AMMONIA in the last 168 hours. CBC: Recent Labs  Lab 06/14/19 1719  WBC 8.7  HGB 11.8*  HCT 34.2*  MCV 96.9  PLT 308   Cardiac Enzymes: No results for input(s): CKTOTAL, CKMB, CKMBINDEX, TROPONINI in the last 168 hours. BNP: Invalid input(s): POCBNP CBG: No results for input(s): GLUCAP in the last 168 hours. D-Dimer No results for input(s): DDIMER in the last 72 hours. Hgb A1c No results for input(s): HGBA1C in the last 72 hours. Lipid Profile No results for input(s): CHOL, HDL, LDLCALC, TRIG, CHOLHDL, LDLDIRECT in the last 72 hours. Thyroid function studies Recent Labs    06/14/19 2341  TSH 2.061   Anemia work up Recent Labs    06/14/19 2341  VITAMINB12 1,445*  FOLATE 26.3  FERRITIN 131  TIBC 283  IRON 58  RETICCTPCT 1.2   Urinalysis    Component Value Date/Time   COLORURINE YELLOW 06/14/2019 1852    APPEARANCEUR CLOUDY (A) 06/14/2019 1852   LABSPEC 1.015 06/14/2019 1852   PHURINE 8.0 06/14/2019 1852   GLUCOSEU NEGATIVE 06/14/2019 1852   HGBUR NEGATIVE 06/14/2019 1852   HGBUR negative 01/16/2010 1150   BILIRUBINUR NEGATIVE 06/14/2019 1852   BILIRUBINUR Negative 03/15/2018 1557   KETONESUR NEGATIVE 06/14/2019 1852   PROTEINUR 30 (A) 06/14/2019 1852   UROBILINOGEN 0.2 03/15/2018 1557   UROBILINOGEN 0.2 02/02/2010 1020   NITRITE NEGATIVE 06/14/2019 1852   LEUKOCYTESUR NEGATIVE 06/14/2019 1852   Sepsis Labs Invalid input(s): PROCALCITONIN,  WBC,  LACTICIDVEN Microbiology Recent Results (from the past 240 hour(s))  Urine Culture     Status: Abnormal   Collection Time: 06/07/19  4:28 PM   Specimen: Urine, Random  Result Value Ref Range Status   Specimen Description URINE, RANDOM  Final   Special Requests   Final    NONE Performed at [facility], [place] [address], [place], [place] [address]    Culture MULTIPLE SPECIES PRESENT, SUGGEST RECOLLECTION (A)  Final   Report Status 06/08/2019 FINAL  Final  SARS CORONAVIRUS 2 (TAT 6-24 HRS) Nasopharyngeal Nasopharyngeal Swab     Status: None   Collection Time: 06/14/19  9:41 PM   Specimen: Nasopharyngeal Swab  Result Value Ref Range Status   SARS Coronavirus 2 NEGATIVE NEGATIVE Final    Comment: (NOTE) SARS-CoV-2 target nucleic acids are NOT DETECTED. The SARS-CoV-2 RNA is generally detectable in upper and lower respiratory specimens during the acute phase of infection. Negative results do not preclude SARS-CoV-2 infection, do not rule out co-infections with other pathogens, and should not be used as the sole basis for treatment or other patient management decisions. Negative results must be combined with clinical observations, patient history, and epidemiological information. The expected result is Negative. Fact Sheet for Patients: SugarRoll.be Fact Sheet for Healthcare  Providers: https://www.woods-mathews.com/ This test is not yet approved or cleared by the [place] FDA and  has been authorized for detection and/or diagnosis of SARS-CoV-2 by FDA under an Emergency Use Authorization (EUA). This EUA will remain  in effect (meaning this test can be used) for the duration of the COVID-19 declaration under Section 56 4(b)(1) of the Act, 21 U.S.C. section 360bbb-3(b)(1), unless the authorization is terminated or revoked sooner. Performed at [facility], [place] [address] [place], [place] [address]      Time coordinating discharge: 35 minutes  SIGNED:   Aline August, MD  [facility] 06/17/2019, 10:24 AM

## 2019-06-17 NOTE — TOC Transition Note (Addendum)
Transition of Care [facility]) - CM/SW Discharge Note   Patient Details  Name: [name] MRN: [id number] Date of Birth: December 15, 1919  Transition of [facility]) CM/SW Contact:  Benard Halsted, LCSW Phone Number: 06/17/2019, 3:38 PM   Clinical Narrative:    Patient will DC to: Home Anticipated DC date: 06/17/19 Family notified: Daughter, [name] at bedside Transport by: Corey Harold   Per MD patient ready for DC to home. RN, patient, patient's family, and home health notified of DC.  Palliative referral made to [facility]. Ambulance transport requested for patient.   CSW will sign off for now as social work intervention is no longer needed. Please consult [place] again if new needs arise.  Cedric Fishman, LCSW Clinical Social Worker [phone]    Final next level of care: Home w Home Health Services Barriers to Discharge: No Barriers Identified   Patient Goals and CMS Choice Patient states their goals for this hospitalization and ongoing recovery are:: Return home CMS Medicare.gov Compare Post Acute Care list provided to:: Patient Represenative (must comment)(Daughter) Choice offered to / list presented to : Adult Children  Discharge Placement                Patient to be transferred to facility by: [place] Name of family member notified: Daughter Patient and family notified of of transfer: 06/17/19  Discharge Plan and Services In-house Referral: Clinical Social Work Discharge Planning Services: Other - See comment(Referral made for outpateint palliative care) Post Acute Care Choice: Durable Medical Equipment, Home Health          DME Arranged: Walker rolling DME Agency: [employer] Date DME Agency Contacted: 06/17/19 Time DME Agency Contacted: 61 Representative spoke with at DME Agency: [place]: PT, OT, Nurse's Aide [place] Agency: [facility] Date Veteran: 06/16/19 Time [place]: 1608 Representative spoke with at [place]: [place] ([place]) Interventions     Readmission Risk Interventions No flowsheet data found.

## 2019-06-17 NOTE — Telephone Encounter (Signed)
Thanks- let me know what they need from me  I will watch for paperwork

## 2019-06-17 NOTE — Progress Notes (Signed)
Pt D/C to home by PTAR, AVS was explained to both Patient and DTR with both acknowledging understanding. Pt's VSS. Belongings given to [facility]

## 2019-06-17 NOTE — Care Management Important Message (Signed)
Important Message  Patient Details  Name: [name] MRN: [id number] Date of Birth: 12-Feb-1920   Medicare Important Message Given:  Yes     Cambrie Sonnenfeld 06/17/2019, 3:01 PM

## 2019-06-18 DIAGNOSIS — D649 Anemia, unspecified: Secondary | ICD-10-CM | POA: Diagnosis not present

## 2019-06-18 DIAGNOSIS — M47816 Spondylosis without myelopathy or radiculopathy, lumbar region: Secondary | ICD-10-CM | POA: Diagnosis not present

## 2019-06-18 DIAGNOSIS — M8580 Other specified disorders of bone density and structure, unspecified site: Secondary | ICD-10-CM | POA: Diagnosis not present

## 2019-06-18 DIAGNOSIS — M5136 Other intervertebral disc degeneration, lumbar region: Secondary | ICD-10-CM | POA: Diagnosis not present

## 2019-06-18 DIAGNOSIS — N390 Urinary tract infection, site not specified: Secondary | ICD-10-CM | POA: Diagnosis not present

## 2019-06-18 DIAGNOSIS — S32019D Unspecified fracture of first lumbar vertebra, subsequent encounter for fracture with routine healing: Secondary | ICD-10-CM | POA: Diagnosis not present

## 2019-06-18 DIAGNOSIS — I6529 Occlusion and stenosis of unspecified carotid artery: Secondary | ICD-10-CM | POA: Diagnosis not present

## 2019-06-18 DIAGNOSIS — I739 Peripheral vascular disease, unspecified: Secondary | ICD-10-CM | POA: Diagnosis not present

## 2019-06-18 DIAGNOSIS — I10 Essential (primary) hypertension: Secondary | ICD-10-CM | POA: Diagnosis not present

## 2019-06-18 DIAGNOSIS — S22089D Unspecified fracture of T11-T12 vertebra, subsequent encounter for fracture with routine healing: Secondary | ICD-10-CM | POA: Diagnosis not present

## 2019-06-18 DIAGNOSIS — S22079D Unspecified fracture of T9-T10 vertebra, subsequent encounter for fracture with routine healing: Secondary | ICD-10-CM | POA: Diagnosis not present

## 2019-06-18 LAB — METHYLMALONIC ACID, SERUM: Methylmalonic Acid, Quantitative: 93 nmol/L (ref 0–378)

## 2019-06-21 DIAGNOSIS — M5136 Other intervertebral disc degeneration, lumbar region: Secondary | ICD-10-CM | POA: Diagnosis not present

## 2019-06-21 DIAGNOSIS — N390 Urinary tract infection, site not specified: Secondary | ICD-10-CM | POA: Diagnosis not present

## 2019-06-21 DIAGNOSIS — M47816 Spondylosis without myelopathy or radiculopathy, lumbar region: Secondary | ICD-10-CM | POA: Diagnosis not present

## 2019-06-21 DIAGNOSIS — D649 Anemia, unspecified: Secondary | ICD-10-CM | POA: Diagnosis not present

## 2019-06-21 DIAGNOSIS — I10 Essential (primary) hypertension: Secondary | ICD-10-CM | POA: Diagnosis not present

## 2019-06-21 DIAGNOSIS — S22079D Unspecified fracture of T9-T10 vertebra, subsequent encounter for fracture with routine healing: Secondary | ICD-10-CM | POA: Diagnosis not present

## 2019-06-21 DIAGNOSIS — S22089D Unspecified fracture of T11-T12 vertebra, subsequent encounter for fracture with routine healing: Secondary | ICD-10-CM | POA: Diagnosis not present

## 2019-06-21 DIAGNOSIS — S32019D Unspecified fracture of first lumbar vertebra, subsequent encounter for fracture with routine healing: Secondary | ICD-10-CM | POA: Diagnosis not present

## 2019-06-21 DIAGNOSIS — I739 Peripheral vascular disease, unspecified: Secondary | ICD-10-CM | POA: Diagnosis not present

## 2019-06-21 DIAGNOSIS — M8580 Other specified disorders of bone density and structure, unspecified site: Secondary | ICD-10-CM | POA: Diagnosis not present

## 2019-06-22 DIAGNOSIS — N390 Urinary tract infection, site not specified: Secondary | ICD-10-CM | POA: Diagnosis not present

## 2019-06-22 DIAGNOSIS — H9190 Unspecified hearing loss, unspecified ear: Secondary | ICD-10-CM

## 2019-06-22 DIAGNOSIS — D649 Anemia, unspecified: Secondary | ICD-10-CM

## 2019-06-22 DIAGNOSIS — I10 Essential (primary) hypertension: Secondary | ICD-10-CM | POA: Diagnosis not present

## 2019-06-22 DIAGNOSIS — I6529 Occlusion and stenosis of unspecified carotid artery: Secondary | ICD-10-CM

## 2019-06-22 DIAGNOSIS — E039 Hypothyroidism, unspecified: Secondary | ICD-10-CM

## 2019-06-22 DIAGNOSIS — M47816 Spondylosis without myelopathy or radiculopathy, lumbar region: Secondary | ICD-10-CM

## 2019-06-22 DIAGNOSIS — Z792 Long term (current) use of antibiotics: Secondary | ICD-10-CM

## 2019-06-22 DIAGNOSIS — S22089D Unspecified fracture of T11-T12 vertebra, subsequent encounter for fracture with routine healing: Secondary | ICD-10-CM | POA: Diagnosis not present

## 2019-06-22 DIAGNOSIS — Z9181 History of falling: Secondary | ICD-10-CM

## 2019-06-22 DIAGNOSIS — M8580 Other specified disorders of bone density and structure, unspecified site: Secondary | ICD-10-CM | POA: Diagnosis not present

## 2019-06-22 DIAGNOSIS — M5136 Other intervertebral disc degeneration, lumbar region: Secondary | ICD-10-CM

## 2019-06-22 DIAGNOSIS — E7800 Pure hypercholesterolemia, unspecified: Secondary | ICD-10-CM

## 2019-06-22 DIAGNOSIS — Z7982 Long term (current) use of aspirin: Secondary | ICD-10-CM

## 2019-06-22 DIAGNOSIS — S22079D Unspecified fracture of T9-T10 vertebra, subsequent encounter for fracture with routine healing: Secondary | ICD-10-CM | POA: Diagnosis not present

## 2019-06-22 DIAGNOSIS — S32019D Unspecified fracture of first lumbar vertebra, subsequent encounter for fracture with routine healing: Secondary | ICD-10-CM | POA: Diagnosis not present

## 2019-06-22 DIAGNOSIS — I739 Peripheral vascular disease, unspecified: Secondary | ICD-10-CM

## 2019-06-22 DIAGNOSIS — E538 Deficiency of other specified B group vitamins: Secondary | ICD-10-CM

## 2019-06-22 DIAGNOSIS — Z8546 Personal history of malignant neoplasm of prostate: Secondary | ICD-10-CM

## 2019-06-23 DIAGNOSIS — S22079D Unspecified fracture of T9-T10 vertebra, subsequent encounter for fracture with routine healing: Secondary | ICD-10-CM | POA: Diagnosis not present

## 2019-06-23 DIAGNOSIS — M47816 Spondylosis without myelopathy or radiculopathy, lumbar region: Secondary | ICD-10-CM | POA: Diagnosis not present

## 2019-06-23 DIAGNOSIS — M8580 Other specified disorders of bone density and structure, unspecified site: Secondary | ICD-10-CM | POA: Diagnosis not present

## 2019-06-23 DIAGNOSIS — I10 Essential (primary) hypertension: Secondary | ICD-10-CM | POA: Diagnosis not present

## 2019-06-23 DIAGNOSIS — S32019D Unspecified fracture of first lumbar vertebra, subsequent encounter for fracture with routine healing: Secondary | ICD-10-CM | POA: Diagnosis not present

## 2019-06-23 DIAGNOSIS — D649 Anemia, unspecified: Secondary | ICD-10-CM | POA: Diagnosis not present

## 2019-06-23 DIAGNOSIS — M5136 Other intervertebral disc degeneration, lumbar region: Secondary | ICD-10-CM | POA: Diagnosis not present

## 2019-06-23 DIAGNOSIS — I739 Peripheral vascular disease, unspecified: Secondary | ICD-10-CM | POA: Diagnosis not present

## 2019-06-23 DIAGNOSIS — N390 Urinary tract infection, site not specified: Secondary | ICD-10-CM | POA: Diagnosis not present

## 2019-06-23 DIAGNOSIS — S22089D Unspecified fracture of T11-T12 vertebra, subsequent encounter for fracture with routine healing: Secondary | ICD-10-CM | POA: Diagnosis not present

## 2019-06-26 DIAGNOSIS — D649 Anemia, unspecified: Secondary | ICD-10-CM | POA: Diagnosis not present

## 2019-06-26 DIAGNOSIS — S32019D Unspecified fracture of first lumbar vertebra, subsequent encounter for fracture with routine healing: Secondary | ICD-10-CM | POA: Diagnosis not present

## 2019-06-26 DIAGNOSIS — M8580 Other specified disorders of bone density and structure, unspecified site: Secondary | ICD-10-CM | POA: Diagnosis not present

## 2019-06-26 DIAGNOSIS — M5136 Other intervertebral disc degeneration, lumbar region: Secondary | ICD-10-CM | POA: Diagnosis not present

## 2019-06-26 DIAGNOSIS — M47816 Spondylosis without myelopathy or radiculopathy, lumbar region: Secondary | ICD-10-CM | POA: Diagnosis not present

## 2019-06-26 DIAGNOSIS — I739 Peripheral vascular disease, unspecified: Secondary | ICD-10-CM | POA: Diagnosis not present

## 2019-06-26 DIAGNOSIS — N390 Urinary tract infection, site not specified: Secondary | ICD-10-CM | POA: Diagnosis not present

## 2019-06-26 DIAGNOSIS — S22089D Unspecified fracture of T11-T12 vertebra, subsequent encounter for fracture with routine healing: Secondary | ICD-10-CM | POA: Diagnosis not present

## 2019-06-26 DIAGNOSIS — S22079D Unspecified fracture of T9-T10 vertebra, subsequent encounter for fracture with routine healing: Secondary | ICD-10-CM | POA: Diagnosis not present

## 2019-06-26 DIAGNOSIS — I10 Essential (primary) hypertension: Secondary | ICD-10-CM | POA: Diagnosis not present

## 2019-06-27 DIAGNOSIS — M5136 Other intervertebral disc degeneration, lumbar region: Secondary | ICD-10-CM | POA: Diagnosis not present

## 2019-06-27 DIAGNOSIS — S32019D Unspecified fracture of first lumbar vertebra, subsequent encounter for fracture with routine healing: Secondary | ICD-10-CM | POA: Diagnosis not present

## 2019-06-27 DIAGNOSIS — S22089D Unspecified fracture of T11-T12 vertebra, subsequent encounter for fracture with routine healing: Secondary | ICD-10-CM | POA: Diagnosis not present

## 2019-06-27 DIAGNOSIS — S22079D Unspecified fracture of T9-T10 vertebra, subsequent encounter for fracture with routine healing: Secondary | ICD-10-CM | POA: Diagnosis not present

## 2019-06-27 DIAGNOSIS — I10 Essential (primary) hypertension: Secondary | ICD-10-CM | POA: Diagnosis not present

## 2019-06-27 DIAGNOSIS — D649 Anemia, unspecified: Secondary | ICD-10-CM | POA: Diagnosis not present

## 2019-06-27 DIAGNOSIS — M8580 Other specified disorders of bone density and structure, unspecified site: Secondary | ICD-10-CM | POA: Diagnosis not present

## 2019-06-27 DIAGNOSIS — M47816 Spondylosis without myelopathy or radiculopathy, lumbar region: Secondary | ICD-10-CM | POA: Diagnosis not present

## 2019-06-27 DIAGNOSIS — I739 Peripheral vascular disease, unspecified: Secondary | ICD-10-CM | POA: Diagnosis not present

## 2019-06-27 DIAGNOSIS — N390 Urinary tract infection, site not specified: Secondary | ICD-10-CM | POA: Diagnosis not present

## 2019-06-29 ENCOUNTER — Ambulatory Visit (INDEPENDENT_AMBULATORY_CARE_PROVIDER_SITE_OTHER): Payer: Medicare HMO | Admitting: Family Medicine

## 2019-06-29 ENCOUNTER — Other Ambulatory Visit: Payer: Self-pay

## 2019-06-29 ENCOUNTER — Encounter: Payer: Self-pay | Admitting: Family Medicine

## 2019-06-29 VITALS — BP 128/65 | HR 53 | Temp 98.3°F | Ht 63.0 in | Wt 148.2 lb

## 2019-06-29 DIAGNOSIS — S22079D Unspecified fracture of T9-T10 vertebra, subsequent encounter for fracture with routine healing: Secondary | ICD-10-CM | POA: Diagnosis not present

## 2019-06-29 DIAGNOSIS — R350 Frequency of micturition: Secondary | ICD-10-CM | POA: Diagnosis not present

## 2019-06-29 DIAGNOSIS — S32019D Unspecified fracture of first lumbar vertebra, subsequent encounter for fracture with routine healing: Secondary | ICD-10-CM | POA: Diagnosis not present

## 2019-06-29 DIAGNOSIS — M8580 Other specified disorders of bone density and structure, unspecified site: Secondary | ICD-10-CM | POA: Diagnosis not present

## 2019-06-29 DIAGNOSIS — M47816 Spondylosis without myelopathy or radiculopathy, lumbar region: Secondary | ICD-10-CM | POA: Diagnosis not present

## 2019-06-29 DIAGNOSIS — D649 Anemia, unspecified: Secondary | ICD-10-CM

## 2019-06-29 DIAGNOSIS — Y92009 Unspecified place in unspecified non-institutional (private) residence as the place of occurrence of the external cause: Secondary | ICD-10-CM | POA: Diagnosis not present

## 2019-06-29 DIAGNOSIS — W19XXXA Unspecified fall, initial encounter: Secondary | ICD-10-CM | POA: Diagnosis not present

## 2019-06-29 DIAGNOSIS — R531 Weakness: Secondary | ICD-10-CM | POA: Diagnosis not present

## 2019-06-29 DIAGNOSIS — E039 Hypothyroidism, unspecified: Secondary | ICD-10-CM

## 2019-06-29 DIAGNOSIS — I10 Essential (primary) hypertension: Secondary | ICD-10-CM | POA: Diagnosis not present

## 2019-06-29 DIAGNOSIS — S22078S Other fracture of T9-T10 vertebra, sequela: Secondary | ICD-10-CM

## 2019-06-29 DIAGNOSIS — M8588 Other specified disorders of bone density and structure, other site: Secondary | ICD-10-CM

## 2019-06-29 DIAGNOSIS — Z515 Encounter for palliative care: Secondary | ICD-10-CM

## 2019-06-29 DIAGNOSIS — I739 Peripheral vascular disease, unspecified: Secondary | ICD-10-CM | POA: Diagnosis not present

## 2019-06-29 DIAGNOSIS — S22089D Unspecified fracture of T11-T12 vertebra, subsequent encounter for fracture with routine healing: Secondary | ICD-10-CM | POA: Diagnosis not present

## 2019-06-29 DIAGNOSIS — M5136 Other intervertebral disc degeneration, lumbar region: Secondary | ICD-10-CM | POA: Diagnosis not present

## 2019-06-29 DIAGNOSIS — S22079A Unspecified fracture of T9-T10 vertebra, initial encounter for closed fracture: Secondary | ICD-10-CM | POA: Insufficient documentation

## 2019-06-29 DIAGNOSIS — N390 Urinary tract infection, site not specified: Secondary | ICD-10-CM | POA: Diagnosis not present

## 2019-06-29 MED ORDER — AMLODIPINE BESYLATE 5 MG PO TABS: 5.0000 mg | ORAL_TABLET | Freq: Every day | ORAL | 11 refills | Status: DC

## 2019-06-29 NOTE — Progress Notes (Signed)
Subjective:    Patient ID: [name], male    DOB: 1919-09-24, 85 y.o.   MRN: [id number]  HPI Here for f/u of hospitalization from 10/6 to 06/17/19   He presented with LE weakness and a fall   (pt reports several falls in the weeks preceeding)- last one he did hit his head  Found to have T9 compression fracture and also UTI  neurosurg recommended TLSO brace and consc management  Family declined SNF which was recommended  Home PT ordered as well as rolling walker   MR TS IMPRESSION: 1. Acute/subacute fracture of the T9 vertebral body through the anterior bridging osteophyte and extending to the posterior inferior vertebral body cortex with fluid within the anterior fracture cleft. Prevertebral soft tissue swelling and small hematoma. No retropulsion of fragments. The posterior ligamentous complex is intact and no cord stenosis. 2. Marrow edema within the T8 and T10 vertebral bodies. 3. Chronic compression deformities of T12 and L1. 4. Findings of DISH involving the thoracolumbar spine  These results were called by telephone at the time of interpretation on 06/15/2019 at 2:35 am to provider [employer] , who verbally acknowledged these results.  Thankfully he did not c/o much pain  He did have significant hospital delerium  Still has a little confusion-slowly getting better    CT of head- age related changes only   cxr showed poss of pneumonia but procalcitonin was neg and no cough-so did not tx with abx  Urine cx showed multiple species  Corona test neg   BP was noted to be high in hospital  Amlodipine was ordered   With guarded to poor prognosis -palliative team was consulted   Lab Results  Component Value Date   CREATININE 1.16 06/14/2019   BUN 28 (H) 06/14/2019   NA 135 06/14/2019   K 4.3 06/14/2019   CL 100 06/14/2019   CO2 25 06/14/2019   Lab Results  Component Value Date   ALT 13 09/06/2018   AST 18 09/06/2018   ALKPHOS 60 09/06/2018   BILITOT 0.4 09/06/2018   Lab Results  Component Value Date   WBC 8.7 06/14/2019   HGB 11.8 (L) 06/14/2019   HCT 34.2 (L) 06/14/2019   MCV 96.9 06/14/2019   PLT 308 06/14/2019   Pt could not weigh here today Wt Readings from Last 3 Encounters:  06/29/19 148 lb 3 oz (67.2 kg)  06/16/19 149 lb 14.6 oz (68 kg)  06/07/19 150 lb (68 kg)   bp is stable today  Re check 128/65  No cp or palpitations or headaches or edema  No side effects to medicines  BP Readings from Last 3 Encounters:  06/29/19 140/62  06/17/19 (!) 141/51  06/07/19 (!) 139/54     Tired/more sleepy  Sleeping more at home  PT/OT coming at home - may have someone on the weekends  Needs help getting into bed - swinging legs around -will be getting a hand rail   Things at home are still very stressful   Palliative care is planning on coming out   He still gets up every 2 hours at night to go to the bathroom  Does not drink a lot at night  He does not empty bladder very well  Had TURP in the past  Taking oxybutinin for incontinence   Has a bedside commode if needed   Patient Active Problem List   Diagnosis Date Noted  . T9 vertebral fracture ([place]) 06/29/2019  . Goals of  care, counseling/discussion   . Palliative care by specialist   . Lower extremity weakness 06/14/2019  . Weakness 06/14/2019  . Hearing loss 09/14/2018  . Thickened nails 09/14/2018  . PVD (peripheral vascular disease) ([place]) 11/05/2017  . Fall at home 09/11/2017  . Poor balance 09/11/2017  . Vitamin D deficiency 08/28/2016  . Routine general medical examination at a health care facility 08/24/2015  . B12 deficiency 08/14/2014  . Low back pain 05/02/2014  . Urinary frequency 05/02/2014  . Encounter for Medicare annual wellness exam 07/27/2013  . Motion sickness 04/04/2013  . Osteopenia 06/14/2012  . Hypothyroid 06/14/2012  . 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 FECAL OCCULT BLOOD 02/01/2010  . ANEMIA, MILD 01/23/2010  . Essential hypertension  02/23/2009  . Carotid artery stenosis 02/23/2009  . SPONDYLOSIS, LUMBAR 02/23/2009  . NECK PAIN, CHRONIC 02/23/2009  . BACK PAIN, CHRONIC 02/23/2009  . H/O compression fracture of spine 02/23/2009  . HYPERCHOLESTEROLEMIA 02/21/2009  . History of prostate cancer 02/21/2009   Past Medical History:  Diagnosis Date  . Cancer [facility])    prostate  . Carotid artery stenosis   . Degenerative disc disease   . Fractures    compression  . History of prostate cancer   . Hyperlipidemia   . Hypertension   . Kyphosis   . Non-cardiac chest pain 04/11   hospital ? [facility]   Past Surgical History:  Procedure Laterality Date  . CARDIOVASCULAR STRESS TEST  2004   cardiolite negative  . carotid doppler  11/05   0-39% bilatteral  . herniated disc surgery  1990   L3-4  . [place]   right  . prostate cancer surgery    . PROSTATE SURGERY    . TONSILLECTOMY     Social History   Tobacco Use  . Smoking status: Never Smoker  . Smokeless tobacco: Never Used  Substance Use Topics  . Alcohol use: Yes    Alcohol/week: 14.0 standard drinks    Types: 14 Glasses of wine per week  . Drug use: No   Family History  Problem Relation Age of Onset  . Hypertension Father    Allergies  Allergen Reactions  . Simvastatin Other (See Comments)    Aches and pains   Current Outpatient Medications on File Prior to Visit  Medication Sig Dispense Refill  . Alpha-D-Galactosidase (BEANO) TABS Take 1 tablet by mouth as needed (before meals, to improve digestion).    Marland Kitchen aspirin EC 81 MG EC tablet Take 81 mg by mouth 3 (three) times a week.     . Cholecalciferol (VITAMIN D-3) 25 MCG (1000 UT) CAPS Take 1,000-2,000 Units by mouth daily.    Marland Kitchen guaiFENesin (MUCINEX) 600 MG 12 hr tablet Take 600 mg by mouth 2 (two) times daily as needed for to loosen phlegm.     Marland Kitchen levothyroxine (SYNTHROID, LEVOTHROID) 25 MCG tablet Take 1 tablet (25 mcg total) by mouth daily before breakfast. 30 tablet 11  .  oxybutynin (DITROPAN-XL) 5 MG 24 hr tablet Take 1 tablet (5 mg total) by mouth See admin instructions. Take 5 mg by mouth every other night    . SELENIUM PO Take 1 tablet by mouth daily with breakfast.     . vitamin B-12 (CYANOCOBALAMIN) 100 MCG tablet Take 1 tablet (100 mcg total) by mouth every other day. 1 tablet 0   No current facility-administered medications on file prior to visit.      Review of Systems  Constitutional: Positive for fatigue. Negative  for activity change, appetite change, fever and unexpected weight change.  HENT: Negative for congestion, rhinorrhea, sore throat and trouble swallowing.   Eyes: Negative for pain, redness, itching and visual disturbance.  Respiratory: Negative for cough, chest tightness, shortness of breath and wheezing.   Cardiovascular: Negative for chest pain and palpitations.  Gastrointestinal: Negative for abdominal pain, blood in stool, constipation, diarrhea and nausea.  Endocrine: Negative for cold intolerance, heat intolerance, polydipsia and polyuria.  Genitourinary: Positive for frequency and urgency. Negative for difficulty urinating and dysuria.       Baseline incontinence and overactive bladder  May not be emptying well  Musculoskeletal: Positive for arthralgias, back pain, gait problem and neck pain. Negative for joint swelling and myalgias.  Skin: Negative for pallor and rash.  Neurological: Negative for dizziness, tremors, weakness, numbness and headaches.  Hematological: Negative for adenopathy. Does not bruise/bleed easily.  Psychiatric/Behavioral: Negative for decreased concentration and dysphoric mood. The patient is not nervous/anxious.        Short term memory is not as sharp Had delirium in hospital that is now improved        Objective:   Physical Exam Constitutional:      General: He is not in acute distress.    Appearance: Normal appearance. He is well-developed and normal weight. He is not ill-appearing or diaphoretic.      Comments: Frail appearing Sitting in rolling walker  HENT:     Head: Normocephalic and atraumatic.     Right Ear: Ear canal and external ear normal.     Left Ear: Ear canal and external ear normal.     Nose: No congestion.     Mouth/Throat:     Mouth: Mucous membranes are moist.     Pharynx: Oropharynx is clear. No posterior oropharyngeal erythema.  Eyes:     General: No scleral icterus.       Right eye: No discharge.        Left eye: No discharge.     Conjunctiva/sclera: Conjunctivae normal.     Pupils: Pupils are equal, round, and reactive to light.  Neck:     Musculoskeletal: Normal range of motion and neck supple. No neck rigidity or muscular tenderness.     Thyroid: No thyromegaly.     Vascular: No carotid bruit or JVD.  Cardiovascular:     Rate and Rhythm: Normal rate and regular rhythm.     Pulses: Normal pulses.     Heart sounds: Normal heart sounds. No gallop.   Pulmonary:     Effort: Pulmonary effort is normal. No respiratory distress.     Breath sounds: Normal breath sounds. No wheezing or rales.     Comments: Good air exch Chest:     Chest wall: No tenderness.  Abdominal:     General: Bowel sounds are normal. There is no distension or abdominal bruit.     Palpations: Abdomen is soft. There is no mass.     Tenderness: There is no abdominal tenderness.     Hernia: No hernia is present.  Musculoskeletal:        General: No tenderness.     Right lower leg: No edema.     Left lower leg: No edema.     Comments: Wearing TLSO brace No tenderness Limited rom spine  Mobility impaired   Severe kyphosis   Lymphadenopathy:     Cervical: No cervical adenopathy.  Skin:    General: Skin is warm and dry.     Coloration:  Skin is not pale.     Findings: No erythema or rash.  Neurological:     Mental Status: He is alert.     Cranial Nerves: No cranial nerve deficit.     Motor: No abnormal muscle tone.     Coordination: Coordination normal.     Gait: Gait normal.      Deep Tendon Reflexes: Reflexes are normal and symmetric. Reflexes normal.     Comments: Generalized weakness  Slow gait with walker/assist  Psychiatric:        Mood and Affect: Mood normal.        Speech: Speech is tangential.     Comments: Cognition is slowed today  Pleasant and able to answer questions  Helped by his daughter [name]           Assessment & Plan:   Problem List Items Addressed This Visit      Cardiovascular and Mediastinum   Essential hypertension    bp was up in the hospital and amlodipine was started Tolerating well bp in fair control at this time  BP Readings from Last 1 Encounters:  06/29/19 128/65   No changes needed Most recent labs reviewed  Disc lifstyle change with low sodium diet and exercise        Relevant Medications   amLODipine (NORVASC) 5 MG tablet     Endocrine   Hypothyroid    Hypothyroidism  Pt has no clinical changes No change in energy level/ hair or skin/ edema and no tremor Lab Results  Component Value Date   TSH 2.061 06/14/2019            Musculoskeletal and Integument   Osteopenia    Now with T9 spinal fracture  May consider dexa and disc of tx options once healed and done with PT  Fall prevention discussed      T9 vertebral fracture ([place]) - Primary    Reviewed hospital records, lab results and studies in detail  From one of several falls  neurosurg consulted-recommended TLSO brace and PT (doing at home)  Using rolling walker  Will discuss tx of OP in the future  Very kyphotic-this affects gait as well  Adding to general decline - palliative care was consulted  Pt is not c/o pain          Other   ANEMIA, MILD    Fairly stable in the hospital  (some dilutional effect)      Urinary frequency    Pt continues oxybutinin s/p prostate surgery in the past      Fall at home    Now working with PT/OT after hosp for T9 fx  Fall prev discussed  Using rolling walker  Enc use of bedside commode at night       Weakness    Generalized Reviewed hospital records, lab results and studies in detail  Now working with home PT      Palliative care by specialist    Referral done in hospital- advanced age/ spinal fracture and mobility impaired Guarded prognosis  Comfort is most important goal

## 2019-06-29 NOTE — Assessment & Plan Note (Signed)
Fairly stable in the hospital  (some dilutional effect)

## 2019-06-29 NOTE — Assessment & Plan Note (Signed)
Pt continues oxybutinin s/p prostate surgery in the past

## 2019-06-29 NOTE — Assessment & Plan Note (Signed)
bp was up in the hospital and amlodipine was started Tolerating well bp in fair control at this time  BP Readings from Last 1 Encounters:  06/29/19 128/65   No changes needed Most recent labs reviewed  Disc lifstyle change with low sodium diet and exercise

## 2019-06-29 NOTE — Assessment & Plan Note (Signed)
Now working with PT/OT after hosp for T9 fx  Fall prev discussed  Using rolling walker  Enc use of bedside commode at night

## 2019-06-29 NOTE — Assessment & Plan Note (Signed)
Referral done in hospital- advanced age/ spinal fracture and mobility impaired Guarded prognosis  Comfort is most important goal

## 2019-06-29 NOTE — Assessment & Plan Note (Signed)
Now with T9 spinal fracture  May consider dexa and disc of tx options once healed and done with PT  Fall prevention discussed

## 2019-06-29 NOTE — Patient Instructions (Addendum)
Continue the amlodipine for blood pressure  Continue the therapy to get stronger   Follow the guidance of PT- re wearing the back brace  Please use a bedside commode for safety -at least until you are stronger  Continue use of a walker    Once getting back to baseline we may consider a urology visit  Continue oxybutinin   Also we may consider bone density testing and treatment for osteoporosis

## 2019-06-29 NOTE — Assessment & Plan Note (Signed)
Generalized Reviewed hospital records, lab results and studies in detail  Now working with home PT

## 2019-06-29 NOTE — Assessment & Plan Note (Signed)
Hypothyroidism  Pt has no clinical changes No change in energy level/ hair or skin/ edema and no tremor Lab Results  Component Value Date   TSH 2.061 06/14/2019

## 2019-06-29 NOTE — Assessment & Plan Note (Signed)
Reviewed hospital records, lab results and studies in detail  From one of several falls  neurosurg consulted-recommended TLSO brace and PT (doing at home)  Using rolling walker  Will discuss tx of OP in the future  Very kyphotic-this affects gait as well  Adding to general decline - palliative care was consulted  Pt is not c/o pain

## 2019-06-30 ENCOUNTER — Telehealth: Payer: Self-pay

## 2019-06-30 DIAGNOSIS — N390 Urinary tract infection, site not specified: Secondary | ICD-10-CM | POA: Diagnosis not present

## 2019-06-30 DIAGNOSIS — S22089D Unspecified fracture of T11-T12 vertebra, subsequent encounter for fracture with routine healing: Secondary | ICD-10-CM | POA: Diagnosis not present

## 2019-06-30 DIAGNOSIS — M47816 Spondylosis without myelopathy or radiculopathy, lumbar region: Secondary | ICD-10-CM | POA: Diagnosis not present

## 2019-06-30 DIAGNOSIS — S22079D Unspecified fracture of T9-T10 vertebra, subsequent encounter for fracture with routine healing: Secondary | ICD-10-CM | POA: Diagnosis not present

## 2019-06-30 DIAGNOSIS — M8580 Other specified disorders of bone density and structure, unspecified site: Secondary | ICD-10-CM | POA: Diagnosis not present

## 2019-06-30 DIAGNOSIS — M5136 Other intervertebral disc degeneration, lumbar region: Secondary | ICD-10-CM | POA: Diagnosis not present

## 2019-06-30 DIAGNOSIS — I10 Essential (primary) hypertension: Secondary | ICD-10-CM | POA: Diagnosis not present

## 2019-06-30 DIAGNOSIS — S32019D Unspecified fracture of first lumbar vertebra, subsequent encounter for fracture with routine healing: Secondary | ICD-10-CM | POA: Diagnosis not present

## 2019-06-30 DIAGNOSIS — D649 Anemia, unspecified: Secondary | ICD-10-CM | POA: Diagnosis not present

## 2019-06-30 DIAGNOSIS — I739 Peripheral vascular disease, unspecified: Secondary | ICD-10-CM | POA: Diagnosis not present

## 2019-06-30 MED ORDER — OXYBUTYNIN CHLORIDE ER 5 MG PO TB24: 5.0000 mg | ORAL_TABLET | Freq: Every day | ORAL | 5 refills | Status: DC

## 2019-06-30 NOTE — Telephone Encounter (Signed)
[place] Night - Client TELEPHONE ADVICE RECORD [facility] Patient Name: [name] Gender: Male DOB: [birth year] Age: 85 Y 2 M 28 D Return Phone Number: [phone] (Primary), [phone] (Secondary) Address[place] [address] Client [place] Night - Client Client Site [facility], [place] Contact Type Call Who Is Calling Patient / Member / Family / Caregiver Call Type Triage / Clinical Caller Name Ervey Schur Relationship To Patient Self Return Phone Number [phone] (Primary) Chief Complaint Urinary Incontinence Reason for Call Symptomatic / Request for Effort states would like to leave message for Dr Glori Bickers- visited with her this morning, suggested that he get a prescription that he had been on years ago, pharmacist wont fill it, prescription he has is too old. Calling re: urinary assistance and bladder strengthening. Medication is oxybytinin or ditropan. Medication helps control urinary incontinence. Translation No Nurse Assessment Nurse: Micki Riley, RN, Domenick Gong Date/Time (Eastern Time): 06/29/2019 5:31:20 PM Confirm and document reason for call. If symptomatic, describe symptoms. ---Caller states he has urinary incontinence. Symptoms ongoing, MD aware. Denies fever or pain. Has the patient had close contact with a person known or suspected to have the novel coronavirus illness OR traveled / lives in area with major community spread (including international travel) in the last 14 days from the onset of symptoms? * If Asymptomatic, screen for exposure and travel within the last 14 days. ---No Does the patient have any new or worsening symptoms? ---Yes Will a triage be completed? ---Yes Related visit to physician within the last 2 weeks? ---N/A Does the PT have any chronic conditions? (i.e.  diabetes, asthma, this includes High risk factors for pregnancy, etc.) ---No Is this a behavioral health or substance abuse call? ---No PLEASE NOTE: All timestamps contained within this report are represented as [place] Standard Time. CONFIDENTIALTY NOTICE: This fax transmission is intended only for the addressee. It contains information that is legally privileged, confidential or otherwise protected from use or disclosure. If you are not the intended recipient, you are strictly prohibited from reviewing, disclosing, copying using or disseminating any of this information or taking any action in reliance on or regarding this information. If you have received this fax in error, please notify us immediately by telephone so that we can arrange for its return to [place]. Phone: [phone], Toll-Free: [phone], Fax: [phone] Page: 2 of 2 Call Id: [id number] Guidelines Guideline Title Affirmed Question Affirmed Notes Nurse Date/Time Eilene Ghazi Time) Urinary Symptoms Urinating more frequently than usual (i.e., frequency) Cazares, RN, Domenick Gong 06/29/2019 5:31:42 PM Disp. Time Eilene Ghazi Time) Disposition Final User 06/29/2019 5:41:11 PM See PCP within 24 Hours Yes Cazares, RN, [place] Disagree/Comply Comply Caller Understands Yes PreDisposition Did not know what to do Care Advice Given Per Guideline SEE PCP WITHIN 24 HOURS: * IF OFFICE WILL BE OPEN: You need to be seen within the next 24 hours. Call your doctor (or NP/PA) when the office opens and make an appointment. CALL BACK IF: * Fever occurs * Unable to urinate and bladder feels full * You become worse. CARE ADVICE given per Urinary Symptoms (Adult) guideline. Referrals REFERRED TO PCP OFFICE

## 2019-06-30 NOTE — Telephone Encounter (Signed)
[facility] If side effects or problems let me know  If he is unable to urinate with it-stop it   It can cause dry mouth/constipation and sedation in some people Use caution

## 2019-06-30 NOTE — Telephone Encounter (Signed)
Pt's wife notified Rx sent to pharmacy and I advise wife of all of Dr. Marliss Coots comments and instructions

## 2019-07-04 DIAGNOSIS — M47816 Spondylosis without myelopathy or radiculopathy, lumbar region: Secondary | ICD-10-CM | POA: Diagnosis not present

## 2019-07-04 DIAGNOSIS — S22079D Unspecified fracture of T9-T10 vertebra, subsequent encounter for fracture with routine healing: Secondary | ICD-10-CM | POA: Diagnosis not present

## 2019-07-04 DIAGNOSIS — I10 Essential (primary) hypertension: Secondary | ICD-10-CM | POA: Diagnosis not present

## 2019-07-04 DIAGNOSIS — N390 Urinary tract infection, site not specified: Secondary | ICD-10-CM | POA: Diagnosis not present

## 2019-07-04 DIAGNOSIS — M5136 Other intervertebral disc degeneration, lumbar region: Secondary | ICD-10-CM | POA: Diagnosis not present

## 2019-07-04 DIAGNOSIS — S32019D Unspecified fracture of first lumbar vertebra, subsequent encounter for fracture with routine healing: Secondary | ICD-10-CM | POA: Diagnosis not present

## 2019-07-04 DIAGNOSIS — I739 Peripheral vascular disease, unspecified: Secondary | ICD-10-CM | POA: Diagnosis not present

## 2019-07-04 DIAGNOSIS — D649 Anemia, unspecified: Secondary | ICD-10-CM | POA: Diagnosis not present

## 2019-07-04 DIAGNOSIS — M8580 Other specified disorders of bone density and structure, unspecified site: Secondary | ICD-10-CM | POA: Diagnosis not present

## 2019-07-04 DIAGNOSIS — S22089D Unspecified fracture of T11-T12 vertebra, subsequent encounter for fracture with routine healing: Secondary | ICD-10-CM | POA: Diagnosis not present

## 2019-07-05 ENCOUNTER — Telehealth: Payer: Self-pay | Admitting: Family Medicine

## 2019-07-05 DIAGNOSIS — S22089D Unspecified fracture of T11-T12 vertebra, subsequent encounter for fracture with routine healing: Secondary | ICD-10-CM | POA: Diagnosis not present

## 2019-07-05 DIAGNOSIS — I739 Peripheral vascular disease, unspecified: Secondary | ICD-10-CM | POA: Diagnosis not present

## 2019-07-05 DIAGNOSIS — D649 Anemia, unspecified: Secondary | ICD-10-CM | POA: Diagnosis not present

## 2019-07-05 DIAGNOSIS — M5136 Other intervertebral disc degeneration, lumbar region: Secondary | ICD-10-CM | POA: Diagnosis not present

## 2019-07-05 DIAGNOSIS — M47816 Spondylosis without myelopathy or radiculopathy, lumbar region: Secondary | ICD-10-CM | POA: Diagnosis not present

## 2019-07-05 DIAGNOSIS — M8580 Other specified disorders of bone density and structure, unspecified site: Secondary | ICD-10-CM | POA: Diagnosis not present

## 2019-07-05 DIAGNOSIS — S22079D Unspecified fracture of T9-T10 vertebra, subsequent encounter for fracture with routine healing: Secondary | ICD-10-CM | POA: Diagnosis not present

## 2019-07-05 DIAGNOSIS — S32019D Unspecified fracture of first lumbar vertebra, subsequent encounter for fracture with routine healing: Secondary | ICD-10-CM | POA: Diagnosis not present

## 2019-07-05 DIAGNOSIS — I10 Essential (primary) hypertension: Secondary | ICD-10-CM | POA: Diagnosis not present

## 2019-07-05 DIAGNOSIS — N390 Urinary tract infection, site not specified: Secondary | ICD-10-CM | POA: Diagnosis not present

## 2019-07-05 NOTE — Telephone Encounter (Signed)
Thanks for letting me know -aware 

## 2019-07-05 NOTE — Telephone Encounter (Signed)
[name], occupational therapist with [facility] called to let you know the patient fell on Sunday in the shower. She stated that there was no injuries.  [name] stated that they switched out the equipment in the shower to make it safer for the patient  and she also advised for the patient to have supervised showers from now on.

## 2019-07-06 DIAGNOSIS — S32019D Unspecified fracture of first lumbar vertebra, subsequent encounter for fracture with routine healing: Secondary | ICD-10-CM | POA: Diagnosis not present

## 2019-07-06 DIAGNOSIS — M8580 Other specified disorders of bone density and structure, unspecified site: Secondary | ICD-10-CM | POA: Diagnosis not present

## 2019-07-06 DIAGNOSIS — S22089D Unspecified fracture of T11-T12 vertebra, subsequent encounter for fracture with routine healing: Secondary | ICD-10-CM | POA: Diagnosis not present

## 2019-07-06 DIAGNOSIS — N390 Urinary tract infection, site not specified: Secondary | ICD-10-CM | POA: Diagnosis not present

## 2019-07-06 DIAGNOSIS — I10 Essential (primary) hypertension: Secondary | ICD-10-CM | POA: Diagnosis not present

## 2019-07-06 DIAGNOSIS — M5136 Other intervertebral disc degeneration, lumbar region: Secondary | ICD-10-CM | POA: Diagnosis not present

## 2019-07-06 DIAGNOSIS — D649 Anemia, unspecified: Secondary | ICD-10-CM | POA: Diagnosis not present

## 2019-07-06 DIAGNOSIS — S22079D Unspecified fracture of T9-T10 vertebra, subsequent encounter for fracture with routine healing: Secondary | ICD-10-CM | POA: Diagnosis not present

## 2019-07-06 DIAGNOSIS — M47816 Spondylosis without myelopathy or radiculopathy, lumbar region: Secondary | ICD-10-CM | POA: Diagnosis not present

## 2019-07-06 DIAGNOSIS — I739 Peripheral vascular disease, unspecified: Secondary | ICD-10-CM | POA: Diagnosis not present

## 2019-07-07 DIAGNOSIS — I10 Essential (primary) hypertension: Secondary | ICD-10-CM | POA: Diagnosis not present

## 2019-07-07 DIAGNOSIS — S22079D Unspecified fracture of T9-T10 vertebra, subsequent encounter for fracture with routine healing: Secondary | ICD-10-CM | POA: Diagnosis not present

## 2019-07-07 DIAGNOSIS — M47816 Spondylosis without myelopathy or radiculopathy, lumbar region: Secondary | ICD-10-CM | POA: Diagnosis not present

## 2019-07-07 DIAGNOSIS — S32019D Unspecified fracture of first lumbar vertebra, subsequent encounter for fracture with routine healing: Secondary | ICD-10-CM | POA: Diagnosis not present

## 2019-07-07 DIAGNOSIS — D649 Anemia, unspecified: Secondary | ICD-10-CM | POA: Diagnosis not present

## 2019-07-07 DIAGNOSIS — M5136 Other intervertebral disc degeneration, lumbar region: Secondary | ICD-10-CM | POA: Diagnosis not present

## 2019-07-07 DIAGNOSIS — N390 Urinary tract infection, site not specified: Secondary | ICD-10-CM | POA: Diagnosis not present

## 2019-07-07 DIAGNOSIS — M8580 Other specified disorders of bone density and structure, unspecified site: Secondary | ICD-10-CM | POA: Diagnosis not present

## 2019-07-07 DIAGNOSIS — S22089D Unspecified fracture of T11-T12 vertebra, subsequent encounter for fracture with routine healing: Secondary | ICD-10-CM | POA: Diagnosis not present

## 2019-07-07 DIAGNOSIS — I739 Peripheral vascular disease, unspecified: Secondary | ICD-10-CM | POA: Diagnosis not present

## 2019-07-12 DIAGNOSIS — S32019D Unspecified fracture of first lumbar vertebra, subsequent encounter for fracture with routine healing: Secondary | ICD-10-CM | POA: Diagnosis not present

## 2019-07-12 DIAGNOSIS — I739 Peripheral vascular disease, unspecified: Secondary | ICD-10-CM | POA: Diagnosis not present

## 2019-07-12 DIAGNOSIS — M8580 Other specified disorders of bone density and structure, unspecified site: Secondary | ICD-10-CM | POA: Diagnosis not present

## 2019-07-12 DIAGNOSIS — I10 Essential (primary) hypertension: Secondary | ICD-10-CM | POA: Diagnosis not present

## 2019-07-12 DIAGNOSIS — S22079D Unspecified fracture of T9-T10 vertebra, subsequent encounter for fracture with routine healing: Secondary | ICD-10-CM | POA: Diagnosis not present

## 2019-07-12 DIAGNOSIS — N390 Urinary tract infection, site not specified: Secondary | ICD-10-CM | POA: Diagnosis not present

## 2019-07-12 DIAGNOSIS — S22089D Unspecified fracture of T11-T12 vertebra, subsequent encounter for fracture with routine healing: Secondary | ICD-10-CM | POA: Diagnosis not present

## 2019-07-12 DIAGNOSIS — M47816 Spondylosis without myelopathy or radiculopathy, lumbar region: Secondary | ICD-10-CM | POA: Diagnosis not present

## 2019-07-12 DIAGNOSIS — D649 Anemia, unspecified: Secondary | ICD-10-CM | POA: Diagnosis not present

## 2019-07-12 DIAGNOSIS — M5136 Other intervertebral disc degeneration, lumbar region: Secondary | ICD-10-CM | POA: Diagnosis not present

## 2019-07-14 DIAGNOSIS — I10 Essential (primary) hypertension: Secondary | ICD-10-CM | POA: Diagnosis not present

## 2019-07-14 DIAGNOSIS — D649 Anemia, unspecified: Secondary | ICD-10-CM | POA: Diagnosis not present

## 2019-07-14 DIAGNOSIS — M5136 Other intervertebral disc degeneration, lumbar region: Secondary | ICD-10-CM | POA: Diagnosis not present

## 2019-07-14 DIAGNOSIS — M8580 Other specified disorders of bone density and structure, unspecified site: Secondary | ICD-10-CM | POA: Diagnosis not present

## 2019-07-14 DIAGNOSIS — M47816 Spondylosis without myelopathy or radiculopathy, lumbar region: Secondary | ICD-10-CM | POA: Diagnosis not present

## 2019-07-14 DIAGNOSIS — S22089D Unspecified fracture of T11-T12 vertebra, subsequent encounter for fracture with routine healing: Secondary | ICD-10-CM | POA: Diagnosis not present

## 2019-07-14 DIAGNOSIS — I739 Peripheral vascular disease, unspecified: Secondary | ICD-10-CM | POA: Diagnosis not present

## 2019-07-14 DIAGNOSIS — S22079D Unspecified fracture of T9-T10 vertebra, subsequent encounter for fracture with routine healing: Secondary | ICD-10-CM | POA: Diagnosis not present

## 2019-07-14 DIAGNOSIS — N390 Urinary tract infection, site not specified: Secondary | ICD-10-CM | POA: Diagnosis not present

## 2019-07-14 DIAGNOSIS — S32019D Unspecified fracture of first lumbar vertebra, subsequent encounter for fracture with routine healing: Secondary | ICD-10-CM | POA: Diagnosis not present

## 2019-07-15 DIAGNOSIS — S22079D Unspecified fracture of T9-T10 vertebra, subsequent encounter for fracture with routine healing: Secondary | ICD-10-CM | POA: Diagnosis not present

## 2019-07-15 DIAGNOSIS — D649 Anemia, unspecified: Secondary | ICD-10-CM | POA: Diagnosis not present

## 2019-07-15 DIAGNOSIS — M47816 Spondylosis without myelopathy or radiculopathy, lumbar region: Secondary | ICD-10-CM | POA: Diagnosis not present

## 2019-07-15 DIAGNOSIS — I10 Essential (primary) hypertension: Secondary | ICD-10-CM | POA: Diagnosis not present

## 2019-07-15 DIAGNOSIS — N390 Urinary tract infection, site not specified: Secondary | ICD-10-CM | POA: Diagnosis not present

## 2019-07-15 DIAGNOSIS — M5136 Other intervertebral disc degeneration, lumbar region: Secondary | ICD-10-CM | POA: Diagnosis not present

## 2019-07-15 DIAGNOSIS — S32019D Unspecified fracture of first lumbar vertebra, subsequent encounter for fracture with routine healing: Secondary | ICD-10-CM | POA: Diagnosis not present

## 2019-07-15 DIAGNOSIS — I739 Peripheral vascular disease, unspecified: Secondary | ICD-10-CM | POA: Diagnosis not present

## 2019-07-15 DIAGNOSIS — S22089D Unspecified fracture of T11-T12 vertebra, subsequent encounter for fracture with routine healing: Secondary | ICD-10-CM | POA: Diagnosis not present

## 2019-07-15 DIAGNOSIS — M8580 Other specified disorders of bone density and structure, unspecified site: Secondary | ICD-10-CM | POA: Diagnosis not present

## 2019-07-18 ENCOUNTER — Other Ambulatory Visit: Payer: Medicare HMO | Admitting: Hospice

## 2019-07-18 ENCOUNTER — Other Ambulatory Visit: Payer: Self-pay

## 2019-07-18 DIAGNOSIS — D649 Anemia, unspecified: Secondary | ICD-10-CM | POA: Diagnosis not present

## 2019-07-18 DIAGNOSIS — M5136 Other intervertebral disc degeneration, lumbar region: Secondary | ICD-10-CM | POA: Diagnosis not present

## 2019-07-18 DIAGNOSIS — S22089D Unspecified fracture of T11-T12 vertebra, subsequent encounter for fracture with routine healing: Secondary | ICD-10-CM | POA: Diagnosis not present

## 2019-07-18 DIAGNOSIS — I10 Essential (primary) hypertension: Secondary | ICD-10-CM | POA: Diagnosis not present

## 2019-07-18 DIAGNOSIS — N390 Urinary tract infection, site not specified: Secondary | ICD-10-CM | POA: Diagnosis not present

## 2019-07-18 DIAGNOSIS — Z515 Encounter for palliative care: Secondary | ICD-10-CM

## 2019-07-18 DIAGNOSIS — M8580 Other specified disorders of bone density and structure, unspecified site: Secondary | ICD-10-CM | POA: Diagnosis not present

## 2019-07-18 DIAGNOSIS — M47816 Spondylosis without myelopathy or radiculopathy, lumbar region: Secondary | ICD-10-CM | POA: Diagnosis not present

## 2019-07-18 DIAGNOSIS — S32019D Unspecified fracture of first lumbar vertebra, subsequent encounter for fracture with routine healing: Secondary | ICD-10-CM | POA: Diagnosis not present

## 2019-07-18 DIAGNOSIS — S22078S Other fracture of T9-T10 vertebra, sequela: Secondary | ICD-10-CM

## 2019-07-18 DIAGNOSIS — S22079D Unspecified fracture of T9-T10 vertebra, subsequent encounter for fracture with routine healing: Secondary | ICD-10-CM | POA: Diagnosis not present

## 2019-07-18 DIAGNOSIS — I739 Peripheral vascular disease, unspecified: Secondary | ICD-10-CM | POA: Diagnosis not present

## 2019-07-18 NOTE — Progress Notes (Signed)
[facility] Consult Note Telephone: [phone]  Fax: [phone]  PATIENT NAME: [name] DOB: 15-Oct-1919 MRN: [id number]  PRIMARY CARE PROVIDER:   Abner Greenspan, MD  REFERRING PROVIDER:  Abner Greenspan, MD [place],  [place] [address]  RESPONSIBLE PARTY:  Emergency contact Andersen [place] [phone]    TELEHEALTH VISIT STATEMENT Due to the COVID-19 crisis, this visit was done via telephone from my office. It was initiated and consented to by this patient and/or family. RECOMMENDATIONS/PLAN:   Advance Care Planning/Goals of Care: Telehealth Visit consisted of building trust and discussions on Palliative Medicine as specialized medical care for people living with serious illness, aimed at facilitating advance care plan, symptoms relief and establishing goals of care. [name] expressed concern on how palliative services can be extended to patient without aggravating spouse who is worried about Hospice. Assured her that palliative services will be well explained and differentiated from Hospice care, in next scheduled meeting. Symptom management: Weakness is improving as patient continues to work with PT. Pain r/t T9 fracture well managed with current regimen. Patient not using TLSO Brace which PT said is heavy for patient. Arrangements are on to substitute the brace with a lighter one.  Follow up: Palliative care will continue to follow patient for goals of care clarification and symptom management. I spent 20 minutes providing this consultation from 11am to 11.32mins. More than 50% of the time in this consultation was spent on coordinating communication.   HISTORY OF PRESENT ILLNESS:  [name] is a 85 y.o. year old male with multiple medical problems including HTN, Hypothyroidism, Hx of Prostate CA in remission, fracture of T9. Palliative Care was asked to help address goals of care.   CODE  STATUS: Full  PPS: 40% HOSPICE ELIGIBILITY/DIAGNOSIS: TBD  PAST MEDICAL HISTORY:  Past Medical History:  Diagnosis Date  . Cancer [facility])    prostate  . Carotid artery stenosis   . Degenerative disc disease   . Fractures    compression  . History of prostate cancer   . Hyperlipidemia   . Hypertension   . Kyphosis   . Non-cardiac chest pain 04/11   hospital ? [facility]    SOCIAL HX:  Social History   Tobacco Use  . Smoking status: Never Smoker  . Smokeless tobacco: Never Used  Substance Use Topics  . Alcohol use: Yes    Alcohol/week: 14.0 standard drinks    Types: 14 Glasses of wine per week    ALLERGIES:  Allergies  Allergen Reactions  . Simvastatin Other (See Comments)    Aches and pains     PERTINENT MEDICATIONS:  Outpatient Encounter Medications as of 07/18/2019  Medication Sig  . Alpha-D-Galactosidase (BEANO) TABS Take 1 tablet by mouth as needed (before meals, to improve digestion).  Marland Kitchen amLODipine (NORVASC) 5 MG tablet Take 1 tablet (5 mg total) by mouth daily.  Marland Kitchen aspirin EC 81 MG EC tablet Take 81 mg by mouth 3 (three) times a week.   . Cholecalciferol (VITAMIN D-3) 25 MCG (1000 UT) CAPS Take 1,000-2,000 Units by mouth daily.  Marland Kitchen guaiFENesin (MUCINEX) 600 MG 12 hr tablet Take 600 mg by mouth 2 (two) times daily as needed for to loosen phlegm.   Marland Kitchen levothyroxine (SYNTHROID, LEVOTHROID) 25 MCG tablet Take 1 tablet (25 mcg total) by mouth daily before breakfast.  . oxybutynin (DITROPAN-XL) 5 MG 24 hr tablet Take 1  tablet (5 mg total) by mouth at bedtime.  . SELENIUM PO Take 1 tablet by mouth daily with breakfast.   . vitamin B-12 (CYANOCOBALAMIN) 100 MCG tablet Take 1 tablet (100 mcg total) by mouth every other day.   No facility-administered encounter medications on file as of 07/18/2019.     Teodoro Spray, NP

## 2019-07-19 DIAGNOSIS — I739 Peripheral vascular disease, unspecified: Secondary | ICD-10-CM | POA: Diagnosis not present

## 2019-07-19 DIAGNOSIS — S32019D Unspecified fracture of first lumbar vertebra, subsequent encounter for fracture with routine healing: Secondary | ICD-10-CM | POA: Diagnosis not present

## 2019-07-19 DIAGNOSIS — M8580 Other specified disorders of bone density and structure, unspecified site: Secondary | ICD-10-CM | POA: Diagnosis not present

## 2019-07-19 DIAGNOSIS — M5136 Other intervertebral disc degeneration, lumbar region: Secondary | ICD-10-CM | POA: Diagnosis not present

## 2019-07-19 DIAGNOSIS — D649 Anemia, unspecified: Secondary | ICD-10-CM | POA: Diagnosis not present

## 2019-07-19 DIAGNOSIS — M47816 Spondylosis without myelopathy or radiculopathy, lumbar region: Secondary | ICD-10-CM | POA: Diagnosis not present

## 2019-07-19 DIAGNOSIS — I10 Essential (primary) hypertension: Secondary | ICD-10-CM | POA: Diagnosis not present

## 2019-07-19 DIAGNOSIS — S22089D Unspecified fracture of T11-T12 vertebra, subsequent encounter for fracture with routine healing: Secondary | ICD-10-CM | POA: Diagnosis not present

## 2019-07-19 DIAGNOSIS — S22079D Unspecified fracture of T9-T10 vertebra, subsequent encounter for fracture with routine healing: Secondary | ICD-10-CM | POA: Diagnosis not present

## 2019-07-19 DIAGNOSIS — N390 Urinary tract infection, site not specified: Secondary | ICD-10-CM | POA: Diagnosis not present

## 2019-07-21 DIAGNOSIS — S32019D Unspecified fracture of first lumbar vertebra, subsequent encounter for fracture with routine healing: Secondary | ICD-10-CM | POA: Diagnosis not present

## 2019-07-21 DIAGNOSIS — N390 Urinary tract infection, site not specified: Secondary | ICD-10-CM | POA: Diagnosis not present

## 2019-07-21 DIAGNOSIS — I739 Peripheral vascular disease, unspecified: Secondary | ICD-10-CM | POA: Diagnosis not present

## 2019-07-21 DIAGNOSIS — M5136 Other intervertebral disc degeneration, lumbar region: Secondary | ICD-10-CM | POA: Diagnosis not present

## 2019-07-21 DIAGNOSIS — D649 Anemia, unspecified: Secondary | ICD-10-CM | POA: Diagnosis not present

## 2019-07-21 DIAGNOSIS — M47816 Spondylosis without myelopathy or radiculopathy, lumbar region: Secondary | ICD-10-CM | POA: Diagnosis not present

## 2019-07-21 DIAGNOSIS — S22089D Unspecified fracture of T11-T12 vertebra, subsequent encounter for fracture with routine healing: Secondary | ICD-10-CM | POA: Diagnosis not present

## 2019-07-21 DIAGNOSIS — S22079D Unspecified fracture of T9-T10 vertebra, subsequent encounter for fracture with routine healing: Secondary | ICD-10-CM | POA: Diagnosis not present

## 2019-07-21 DIAGNOSIS — M8580 Other specified disorders of bone density and structure, unspecified site: Secondary | ICD-10-CM | POA: Diagnosis not present

## 2019-07-21 DIAGNOSIS — I10 Essential (primary) hypertension: Secondary | ICD-10-CM | POA: Diagnosis not present

## 2019-07-25 DIAGNOSIS — S22089D Unspecified fracture of T11-T12 vertebra, subsequent encounter for fracture with routine healing: Secondary | ICD-10-CM | POA: Diagnosis not present

## 2019-07-25 DIAGNOSIS — M5136 Other intervertebral disc degeneration, lumbar region: Secondary | ICD-10-CM | POA: Diagnosis not present

## 2019-07-25 DIAGNOSIS — I739 Peripheral vascular disease, unspecified: Secondary | ICD-10-CM | POA: Diagnosis not present

## 2019-07-25 DIAGNOSIS — I10 Essential (primary) hypertension: Secondary | ICD-10-CM | POA: Diagnosis not present

## 2019-07-25 DIAGNOSIS — N390 Urinary tract infection, site not specified: Secondary | ICD-10-CM | POA: Diagnosis not present

## 2019-07-25 DIAGNOSIS — M8580 Other specified disorders of bone density and structure, unspecified site: Secondary | ICD-10-CM | POA: Diagnosis not present

## 2019-07-25 DIAGNOSIS — S22079D Unspecified fracture of T9-T10 vertebra, subsequent encounter for fracture with routine healing: Secondary | ICD-10-CM | POA: Diagnosis not present

## 2019-07-25 DIAGNOSIS — D649 Anemia, unspecified: Secondary | ICD-10-CM | POA: Diagnosis not present

## 2019-07-25 DIAGNOSIS — M47816 Spondylosis without myelopathy or radiculopathy, lumbar region: Secondary | ICD-10-CM | POA: Diagnosis not present

## 2019-07-25 DIAGNOSIS — S32019D Unspecified fracture of first lumbar vertebra, subsequent encounter for fracture with routine healing: Secondary | ICD-10-CM | POA: Diagnosis not present

## 2019-07-28 ENCOUNTER — Other Ambulatory Visit: Payer: Medicare HMO | Admitting: Hospice

## 2019-07-28 ENCOUNTER — Other Ambulatory Visit: Payer: Self-pay

## 2019-07-28 DIAGNOSIS — S22078S Other fracture of T9-T10 vertebra, sequela: Secondary | ICD-10-CM | POA: Diagnosis not present

## 2019-07-28 DIAGNOSIS — Z515 Encounter for palliative care: Secondary | ICD-10-CM

## 2019-07-28 NOTE — Progress Notes (Signed)
[facility] Consult Note Telephone: [phone]  Fax: [phone]  PATIENT NAME: [name] DOB: Aug 08, 1920 MRN: [id number]  PRIMARY CARE PROVIDER:   Abner Greenspan, MD  REFERRING PROVIDER:  Abner Greenspan, MD [place],  [place] [address]  RESPONSIBLE PARTY:  Emergency contact Ivy Plymel [place] [phone] TELEHEALTH VISIT STATEMENT Due to the COVID-19 crisis, this visit was done via telephone from my office. It was initiated and consented to by this patient and/or family.  RECOMMENDATIONS/PLAN:   Care Planning/Goals of Care: Telehealth Visit consisted of building trust and further discussions on patient's preferences, values which need to inform the care he receives. DNR and MOST form was discussed mostly with [name]. She said family will further discuss his code status, though he is currently a full code, they are leaning more towards DNR. Therapeutic listening and validation provided. She requested MOST form mailed to her address to help with family discussion. Follow up call scheduled for Wed December 9 '20. It was agreed that Goals of care included to maximize quality of life and symptom management.  Symptoms Management: Weakness is improving as patient continues to work with PT. [name] reported patient has PT extension for a couple more weeks. He is not using TSLO brace because it is heavy. [name] agreed to request for an alternative or a lighter TSLO from the prescriber. Patient denied pain/discomfort. Encouraged ongoing supportive care.  Follow up: Palliative care will continue to follow patient for goals of care clarification and symptom management.  I spent 40 minutes providing this consultation,  from 12.00pm to 12.40pm. More than 50% of the time in this consultation was spent coordinating communication.   HISTORY OF PRESENT ILLNESS:  [name] is a 85 y.o. year old male with multiple  medical problems including HTN, Hypothyroidism, Hx of Prostate CA in remission, fracture of T9. Palliative Care was asked to help address goals of care.   CODE STATUS: Full  PPS: 40% HOSPICE ELIGIBILITY/DIAGNOSIS: TBD  PAST MEDICAL HISTORY:  Past Medical History:  Diagnosis Date  . Cancer [facility])    prostate  . Carotid artery stenosis   . Degenerative disc disease   . Fractures    compression  . History of prostate cancer   . Hyperlipidemia   . Hypertension   . Kyphosis   . Non-cardiac chest pain 04/11   hospital ? [facility]    SOCIAL HX:  Social History   Tobacco Use  . Smoking status: Never Smoker  . Smokeless tobacco: Never Used  Substance Use Topics  . Alcohol use: Yes    Alcohol/week: 14.0 standard drinks    Types: 14 Glasses of wine per week    ALLERGIES:  Allergies  Allergen Reactions  . Simvastatin Other (See Comments)    Aches and pains     PERTINENT MEDICATIONS:  Outpatient Encounter Medications as of 07/28/2019  Medication Sig  . Alpha-D-Galactosidase (BEANO) TABS Take 1 tablet by mouth as needed (before meals, to improve digestion).  Marland Kitchen amLODipine (NORVASC) 5 MG tablet Take 1 tablet (5 mg total) by mouth daily.  Marland Kitchen aspirin EC 81 MG EC tablet Take 81 mg by mouth 3 (three) times a week.   . Cholecalciferol (VITAMIN D-3) 25 MCG (1000 UT) CAPS Take 1,000-2,000 Units by mouth daily.  Marland Kitchen guaiFENesin (MUCINEX) 600 MG 12 hr tablet Take 600 mg by mouth 2 (two) times daily as needed for to loosen  phlegm.   Marland Kitchen levothyroxine (SYNTHROID, LEVOTHROID) 25 MCG tablet Take 1 tablet (25 mcg total) by mouth daily before breakfast.  . oxybutynin (DITROPAN-XL) 5 MG 24 hr tablet Take 1 tablet (5 mg total) by mouth at bedtime.  . SELENIUM PO Take 1 tablet by mouth daily with breakfast.   . vitamin B-12 (CYANOCOBALAMIN) 100 MCG tablet Take 1 tablet (100 mcg total) by mouth every other day.   No facility-administered encounter medications on file as of 07/28/2019.     PHYSICAL EXAM:    Teodoro Spray, NP

## 2019-08-02 DIAGNOSIS — D649 Anemia, unspecified: Secondary | ICD-10-CM | POA: Diagnosis not present

## 2019-08-02 DIAGNOSIS — S22089D Unspecified fracture of T11-T12 vertebra, subsequent encounter for fracture with routine healing: Secondary | ICD-10-CM | POA: Diagnosis not present

## 2019-08-02 DIAGNOSIS — M8580 Other specified disorders of bone density and structure, unspecified site: Secondary | ICD-10-CM | POA: Diagnosis not present

## 2019-08-02 DIAGNOSIS — I10 Essential (primary) hypertension: Secondary | ICD-10-CM | POA: Diagnosis not present

## 2019-08-02 DIAGNOSIS — N390 Urinary tract infection, site not specified: Secondary | ICD-10-CM | POA: Diagnosis not present

## 2019-08-02 DIAGNOSIS — M47816 Spondylosis without myelopathy or radiculopathy, lumbar region: Secondary | ICD-10-CM | POA: Diagnosis not present

## 2019-08-02 DIAGNOSIS — M5136 Other intervertebral disc degeneration, lumbar region: Secondary | ICD-10-CM | POA: Diagnosis not present

## 2019-08-02 DIAGNOSIS — S22079D Unspecified fracture of T9-T10 vertebra, subsequent encounter for fracture with routine healing: Secondary | ICD-10-CM | POA: Diagnosis not present

## 2019-08-02 DIAGNOSIS — I739 Peripheral vascular disease, unspecified: Secondary | ICD-10-CM | POA: Diagnosis not present

## 2019-08-02 DIAGNOSIS — S32019D Unspecified fracture of first lumbar vertebra, subsequent encounter for fracture with routine healing: Secondary | ICD-10-CM | POA: Diagnosis not present

## 2019-08-05 DIAGNOSIS — D649 Anemia, unspecified: Secondary | ICD-10-CM | POA: Diagnosis not present

## 2019-08-05 DIAGNOSIS — S22089D Unspecified fracture of T11-T12 vertebra, subsequent encounter for fracture with routine healing: Secondary | ICD-10-CM | POA: Diagnosis not present

## 2019-08-05 DIAGNOSIS — I10 Essential (primary) hypertension: Secondary | ICD-10-CM | POA: Diagnosis not present

## 2019-08-05 DIAGNOSIS — M8580 Other specified disorders of bone density and structure, unspecified site: Secondary | ICD-10-CM | POA: Diagnosis not present

## 2019-08-05 DIAGNOSIS — M5136 Other intervertebral disc degeneration, lumbar region: Secondary | ICD-10-CM | POA: Diagnosis not present

## 2019-08-05 DIAGNOSIS — M47816 Spondylosis without myelopathy or radiculopathy, lumbar region: Secondary | ICD-10-CM | POA: Diagnosis not present

## 2019-08-05 DIAGNOSIS — I739 Peripheral vascular disease, unspecified: Secondary | ICD-10-CM | POA: Diagnosis not present

## 2019-08-05 DIAGNOSIS — N390 Urinary tract infection, site not specified: Secondary | ICD-10-CM | POA: Diagnosis not present

## 2019-08-05 DIAGNOSIS — S22079D Unspecified fracture of T9-T10 vertebra, subsequent encounter for fracture with routine healing: Secondary | ICD-10-CM | POA: Diagnosis not present

## 2019-08-05 DIAGNOSIS — S32019D Unspecified fracture of first lumbar vertebra, subsequent encounter for fracture with routine healing: Secondary | ICD-10-CM | POA: Diagnosis not present

## 2019-08-10 ENCOUNTER — Telehealth: Payer: Self-pay

## 2019-08-10 ENCOUNTER — Other Ambulatory Visit (INDEPENDENT_AMBULATORY_CARE_PROVIDER_SITE_OTHER): Payer: Medicare HMO

## 2019-08-10 DIAGNOSIS — R410 Disorientation, unspecified: Secondary | ICD-10-CM | POA: Diagnosis not present

## 2019-08-10 DIAGNOSIS — S22079D Unspecified fracture of T9-T10 vertebra, subsequent encounter for fracture with routine healing: Secondary | ICD-10-CM | POA: Diagnosis not present

## 2019-08-10 DIAGNOSIS — N390 Urinary tract infection, site not specified: Secondary | ICD-10-CM | POA: Diagnosis not present

## 2019-08-10 DIAGNOSIS — I739 Peripheral vascular disease, unspecified: Secondary | ICD-10-CM | POA: Diagnosis not present

## 2019-08-10 DIAGNOSIS — M47816 Spondylosis without myelopathy or radiculopathy, lumbar region: Secondary | ICD-10-CM | POA: Diagnosis not present

## 2019-08-10 DIAGNOSIS — M8580 Other specified disorders of bone density and structure, unspecified site: Secondary | ICD-10-CM | POA: Diagnosis not present

## 2019-08-10 DIAGNOSIS — S32019D Unspecified fracture of first lumbar vertebra, subsequent encounter for fracture with routine healing: Secondary | ICD-10-CM | POA: Diagnosis not present

## 2019-08-10 DIAGNOSIS — D649 Anemia, unspecified: Secondary | ICD-10-CM | POA: Diagnosis not present

## 2019-08-10 DIAGNOSIS — I10 Essential (primary) hypertension: Secondary | ICD-10-CM | POA: Diagnosis not present

## 2019-08-10 DIAGNOSIS — S22089D Unspecified fracture of T11-T12 vertebra, subsequent encounter for fracture with routine healing: Secondary | ICD-10-CM | POA: Diagnosis not present

## 2019-08-10 DIAGNOSIS — M5136 Other intervertebral disc degeneration, lumbar region: Secondary | ICD-10-CM | POA: Diagnosis not present

## 2019-08-10 LAB — POC URINALSYSI DIPSTICK (AUTOMATED)
Bilirubin, UA: NEGATIVE
Blood, UA: NEGATIVE
Glucose, UA: NEGATIVE
Ketones, UA: NEGATIVE
Leukocytes, UA: NEGATIVE
Nitrite, UA: NEGATIVE
Protein, UA: NEGATIVE
Spec Grav, UA: >=1.03 — AB (ref 1.010–1.025)
Urobilinogen, UA: 0.2 E.U./dL
pH, UA: 6 (ref 5.0–8.0)

## 2019-08-10 NOTE — Telephone Encounter (Signed)
Daughter notified to drop off urine sample so we can check it

## 2019-08-10 NOTE — Telephone Encounter (Signed)
Please bring sample- clean catch if possible  Thanks  He is a palliative care pt

## 2019-08-10 NOTE — Telephone Encounter (Signed)
[name] pts daughter (do not see DPR signed) left v/m that pt has been confused and irritated.[name] is afraid has UTI. [name] wants to know if can bring urine specimen to [facility] for testing.[name] request cb.

## 2019-08-12 DIAGNOSIS — M47816 Spondylosis without myelopathy or radiculopathy, lumbar region: Secondary | ICD-10-CM | POA: Diagnosis not present

## 2019-08-12 DIAGNOSIS — S22089D Unspecified fracture of T11-T12 vertebra, subsequent encounter for fracture with routine healing: Secondary | ICD-10-CM | POA: Diagnosis not present

## 2019-08-12 DIAGNOSIS — N390 Urinary tract infection, site not specified: Secondary | ICD-10-CM | POA: Diagnosis not present

## 2019-08-12 DIAGNOSIS — M5136 Other intervertebral disc degeneration, lumbar region: Secondary | ICD-10-CM | POA: Diagnosis not present

## 2019-08-12 DIAGNOSIS — I10 Essential (primary) hypertension: Secondary | ICD-10-CM | POA: Diagnosis not present

## 2019-08-12 DIAGNOSIS — I739 Peripheral vascular disease, unspecified: Secondary | ICD-10-CM | POA: Diagnosis not present

## 2019-08-12 DIAGNOSIS — D649 Anemia, unspecified: Secondary | ICD-10-CM | POA: Diagnosis not present

## 2019-08-12 DIAGNOSIS — S32019D Unspecified fracture of first lumbar vertebra, subsequent encounter for fracture with routine healing: Secondary | ICD-10-CM | POA: Diagnosis not present

## 2019-08-12 DIAGNOSIS — M8580 Other specified disorders of bone density and structure, unspecified site: Secondary | ICD-10-CM | POA: Diagnosis not present

## 2019-08-12 DIAGNOSIS — S22079D Unspecified fracture of T9-T10 vertebra, subsequent encounter for fracture with routine healing: Secondary | ICD-10-CM | POA: Diagnosis not present

## 2019-08-15 DIAGNOSIS — D649 Anemia, unspecified: Secondary | ICD-10-CM | POA: Diagnosis not present

## 2019-08-15 DIAGNOSIS — M5136 Other intervertebral disc degeneration, lumbar region: Secondary | ICD-10-CM | POA: Diagnosis not present

## 2019-08-15 DIAGNOSIS — M8580 Other specified disorders of bone density and structure, unspecified site: Secondary | ICD-10-CM | POA: Diagnosis not present

## 2019-08-15 DIAGNOSIS — S22089D Unspecified fracture of T11-T12 vertebra, subsequent encounter for fracture with routine healing: Secondary | ICD-10-CM | POA: Diagnosis not present

## 2019-08-15 DIAGNOSIS — I739 Peripheral vascular disease, unspecified: Secondary | ICD-10-CM | POA: Diagnosis not present

## 2019-08-15 DIAGNOSIS — S32019D Unspecified fracture of first lumbar vertebra, subsequent encounter for fracture with routine healing: Secondary | ICD-10-CM | POA: Diagnosis not present

## 2019-08-15 DIAGNOSIS — M47816 Spondylosis without myelopathy or radiculopathy, lumbar region: Secondary | ICD-10-CM | POA: Diagnosis not present

## 2019-08-15 DIAGNOSIS — N390 Urinary tract infection, site not specified: Secondary | ICD-10-CM | POA: Diagnosis not present

## 2019-08-15 DIAGNOSIS — S22079D Unspecified fracture of T9-T10 vertebra, subsequent encounter for fracture with routine healing: Secondary | ICD-10-CM | POA: Diagnosis not present

## 2019-08-15 DIAGNOSIS — I10 Essential (primary) hypertension: Secondary | ICD-10-CM | POA: Diagnosis not present

## 2019-08-17 ENCOUNTER — Other Ambulatory Visit: Payer: Self-pay | Admitting: Hospice

## 2019-08-17 ENCOUNTER — Other Ambulatory Visit: Payer: Self-pay

## 2019-08-17 ENCOUNTER — Other Ambulatory Visit: Payer: Medicare HMO | Admitting: Hospice

## 2019-08-17 DIAGNOSIS — Z515 Encounter for palliative care: Secondary | ICD-10-CM

## 2019-08-17 DIAGNOSIS — S22078S Other fracture of T9-T10 vertebra, sequela: Secondary | ICD-10-CM

## 2019-08-17 NOTE — Progress Notes (Signed)
[facility] Consult Note Telephone: [phone]  Fax: [phone]  PATIENT NAME: [name] DOB: 03-05-1920 MRN: [id number]  PRIMARY CARE PROVIDER:   Abner Greenspan, MD  REFERRING PROVIDER:  Abner Greenspan, MD [place],  [place] [address]  [place] contact [name] [phone] TELEHEALTH VISIT STATEMENT Due to the COVID-19 crisis, this visit was done via telephone from my office. It was initiated and consented to by this patient and/or family.  RECOMMENDATIONS/PLAN:  Care Planning/Goals of Care: Telehealth for ongoing building of trust and further discussions on patient's preferences in the MOST form mailed to patient. His selections include to attempt resuscitation, limited additional intervention, antibiotics and IV fluids as indicated and feeding tube for a trial period. Form has been signed by patient; will mail to NP for signature. Goals of care included to maximize quality of life and symptom management. Symptoms Management: Weakness is improving as patient continues to work with PT. He said he ambulates around the hose sometimes with a walker or can and sometimes without. Education provided on the need for walker/cane each time he wants to stand and walk to help prevent a fall; also stressed on the need to slow position changes. He is not using TSLO brace because it is heavy. [name] agreed to request for an alternative or a lighter TSLO from the prescriber next week. Patient denied pain/discomfort. Encouraged ongoing supportive care.  Follow [id number] care will continue to follow patient for goals of care clarification and symptom management. I spent 30 minutes providing this consultation,  from 11.00am to 11.30am. More than 50% of the time in this consultation was spent coordinating communication.  HISTORY OF PRESENT ILLNESS:[name] a 85 y.o.year  oldmalewith multiple medical problems including HTN, Hypothyroidism, Hx of Prostate CA in remission, fracture of T9. Palliative Care was asked to help address goals of care.  CODE STATUS: Full  PPS: 40% HOSPICE ELIGIBILITY/DIAGNOSIS: TBD  PAST MEDICAL HISTORY:  Past Medical History:  Diagnosis Date  . Cancer [facility])    prostate  . Carotid artery stenosis   . Degenerative disc disease   . Fractures    compression  . History of prostate cancer   . Hyperlipidemia   . Hypertension   . Kyphosis   . Non-cardiac chest pain 04/11   hospital ? [facility]    SOCIAL HX:  Social History   Tobacco Use  . Smoking status: Never Smoker  . Smokeless tobacco: Never Used  Substance Use Topics  . Alcohol use: Yes    Alcohol/week: 14.0 standard drinks    Types: 14 Glasses of wine per week    ALLERGIES:  Allergies  Allergen Reactions  . Simvastatin Other (See Comments)    Aches and pains     PERTINENT MEDICATIONS:  Outpatient Encounter Medications as of 08/17/2019  Medication Sig  . Alpha-D-Galactosidase (BEANO) TABS Take 1 tablet by mouth as needed (before meals, to improve digestion).  Marland Kitchen amLODipine (NORVASC) 5 MG tablet Take 1 tablet (5 mg total) by mouth daily.  Marland Kitchen aspirin EC 81 MG EC tablet Take 81 mg by mouth 3 (three) times a week.   . Cholecalciferol (VITAMIN D-3) 25 MCG (1000 UT) CAPS Take 1,000-2,000 Units by mouth daily.  Marland Kitchen guaiFENesin (MUCINEX) 600 MG 12 hr tablet Take 600 mg by mouth 2 (two) times daily as needed for to loosen phlegm.   Marland Kitchen levothyroxine (SYNTHROID, LEVOTHROID) 25 MCG  tablet Take 1 tablet (25 mcg total) by mouth daily before breakfast.  . oxybutynin (DITROPAN-XL) 5 MG 24 hr tablet Take 1 tablet (5 mg total) by mouth at bedtime.  . SELENIUM PO Take 1 tablet by mouth daily with breakfast.   . vitamin B-12 (CYANOCOBALAMIN) 100 MCG tablet Take 1 tablet (100 mcg total) by mouth every other day.   No facility-administered encounter medications on file as of 08/17/2019.      PHYSICAL EXAM:   Teodoro Spray, NP

## 2019-08-18 DIAGNOSIS — S22089D Unspecified fracture of T11-T12 vertebra, subsequent encounter for fracture with routine healing: Secondary | ICD-10-CM | POA: Diagnosis not present

## 2019-08-18 DIAGNOSIS — M47816 Spondylosis without myelopathy or radiculopathy, lumbar region: Secondary | ICD-10-CM | POA: Diagnosis not present

## 2019-08-18 DIAGNOSIS — M8580 Other specified disorders of bone density and structure, unspecified site: Secondary | ICD-10-CM | POA: Diagnosis not present

## 2019-08-18 DIAGNOSIS — I6529 Occlusion and stenosis of unspecified carotid artery: Secondary | ICD-10-CM | POA: Diagnosis not present

## 2019-08-18 DIAGNOSIS — D649 Anemia, unspecified: Secondary | ICD-10-CM | POA: Diagnosis not present

## 2019-08-18 DIAGNOSIS — I10 Essential (primary) hypertension: Secondary | ICD-10-CM | POA: Diagnosis not present

## 2019-08-18 DIAGNOSIS — S32019D Unspecified fracture of first lumbar vertebra, subsequent encounter for fracture with routine healing: Secondary | ICD-10-CM | POA: Diagnosis not present

## 2019-08-18 DIAGNOSIS — I739 Peripheral vascular disease, unspecified: Secondary | ICD-10-CM | POA: Diagnosis not present

## 2019-08-18 DIAGNOSIS — M5136 Other intervertebral disc degeneration, lumbar region: Secondary | ICD-10-CM | POA: Diagnosis not present

## 2019-08-18 DIAGNOSIS — S22079D Unspecified fracture of T9-T10 vertebra, subsequent encounter for fracture with routine healing: Secondary | ICD-10-CM | POA: Diagnosis not present

## 2019-08-22 DIAGNOSIS — E538 Deficiency of other specified B group vitamins: Secondary | ICD-10-CM

## 2019-08-22 DIAGNOSIS — S22089D Unspecified fracture of T11-T12 vertebra, subsequent encounter for fracture with routine healing: Secondary | ICD-10-CM | POA: Diagnosis not present

## 2019-08-22 DIAGNOSIS — M47816 Spondylosis without myelopathy or radiculopathy, lumbar region: Secondary | ICD-10-CM

## 2019-08-22 DIAGNOSIS — I6529 Occlusion and stenosis of unspecified carotid artery: Secondary | ICD-10-CM

## 2019-08-22 DIAGNOSIS — Z9181 History of falling: Secondary | ICD-10-CM

## 2019-08-22 DIAGNOSIS — S22079D Unspecified fracture of T9-T10 vertebra, subsequent encounter for fracture with routine healing: Secondary | ICD-10-CM | POA: Diagnosis not present

## 2019-08-22 DIAGNOSIS — Z7982 Long term (current) use of aspirin: Secondary | ICD-10-CM

## 2019-08-22 DIAGNOSIS — S32019D Unspecified fracture of first lumbar vertebra, subsequent encounter for fracture with routine healing: Secondary | ICD-10-CM | POA: Diagnosis not present

## 2019-08-22 DIAGNOSIS — I10 Essential (primary) hypertension: Secondary | ICD-10-CM | POA: Diagnosis not present

## 2019-08-22 DIAGNOSIS — H9190 Unspecified hearing loss, unspecified ear: Secondary | ICD-10-CM

## 2019-08-22 DIAGNOSIS — E7800 Pure hypercholesterolemia, unspecified: Secondary | ICD-10-CM

## 2019-08-22 DIAGNOSIS — I739 Peripheral vascular disease, unspecified: Secondary | ICD-10-CM | POA: Diagnosis not present

## 2019-08-22 DIAGNOSIS — M8580 Other specified disorders of bone density and structure, unspecified site: Secondary | ICD-10-CM

## 2019-08-22 DIAGNOSIS — Z8546 Personal history of malignant neoplasm of prostate: Secondary | ICD-10-CM

## 2019-08-22 DIAGNOSIS — M5136 Other intervertebral disc degeneration, lumbar region: Secondary | ICD-10-CM | POA: Diagnosis not present

## 2019-08-22 DIAGNOSIS — Z87440 Personal history of urinary (tract) infections: Secondary | ICD-10-CM

## 2019-08-22 DIAGNOSIS — E039 Hypothyroidism, unspecified: Secondary | ICD-10-CM

## 2019-08-22 DIAGNOSIS — D649 Anemia, unspecified: Secondary | ICD-10-CM | POA: Diagnosis not present

## 2019-08-25 DIAGNOSIS — I10 Essential (primary) hypertension: Secondary | ICD-10-CM | POA: Diagnosis not present

## 2019-08-25 DIAGNOSIS — I6529 Occlusion and stenosis of unspecified carotid artery: Secondary | ICD-10-CM | POA: Diagnosis not present

## 2019-08-25 DIAGNOSIS — S32019D Unspecified fracture of first lumbar vertebra, subsequent encounter for fracture with routine healing: Secondary | ICD-10-CM | POA: Diagnosis not present

## 2019-08-25 DIAGNOSIS — M5136 Other intervertebral disc degeneration, lumbar region: Secondary | ICD-10-CM | POA: Diagnosis not present

## 2019-08-25 DIAGNOSIS — M47816 Spondylosis without myelopathy or radiculopathy, lumbar region: Secondary | ICD-10-CM | POA: Diagnosis not present

## 2019-08-25 DIAGNOSIS — S22079D Unspecified fracture of T9-T10 vertebra, subsequent encounter for fracture with routine healing: Secondary | ICD-10-CM | POA: Diagnosis not present

## 2019-08-25 DIAGNOSIS — M8580 Other specified disorders of bone density and structure, unspecified site: Secondary | ICD-10-CM | POA: Diagnosis not present

## 2019-08-25 DIAGNOSIS — S22089D Unspecified fracture of T11-T12 vertebra, subsequent encounter for fracture with routine healing: Secondary | ICD-10-CM | POA: Diagnosis not present

## 2019-08-25 DIAGNOSIS — D649 Anemia, unspecified: Secondary | ICD-10-CM | POA: Diagnosis not present

## 2019-08-25 DIAGNOSIS — I739 Peripheral vascular disease, unspecified: Secondary | ICD-10-CM | POA: Diagnosis not present

## 2019-08-28 DIAGNOSIS — N529 Male erectile dysfunction, unspecified: Secondary | ICD-10-CM | POA: Diagnosis not present

## 2019-08-28 DIAGNOSIS — G8929 Other chronic pain: Secondary | ICD-10-CM | POA: Diagnosis not present

## 2019-08-28 DIAGNOSIS — I10 Essential (primary) hypertension: Secondary | ICD-10-CM | POA: Diagnosis not present

## 2019-08-28 DIAGNOSIS — R32 Unspecified urinary incontinence: Secondary | ICD-10-CM | POA: Diagnosis not present

## 2019-08-28 DIAGNOSIS — I739 Peripheral vascular disease, unspecified: Secondary | ICD-10-CM | POA: Diagnosis not present

## 2019-08-28 DIAGNOSIS — Z9181 History of falling: Secondary | ICD-10-CM | POA: Diagnosis not present

## 2019-08-28 DIAGNOSIS — I499 Cardiac arrhythmia, unspecified: Secondary | ICD-10-CM | POA: Diagnosis not present

## 2019-08-28 DIAGNOSIS — Z8546 Personal history of malignant neoplasm of prostate: Secondary | ICD-10-CM | POA: Diagnosis not present

## 2019-08-28 DIAGNOSIS — G629 Polyneuropathy, unspecified: Secondary | ICD-10-CM | POA: Diagnosis not present

## 2019-08-28 DIAGNOSIS — Z7982 Long term (current) use of aspirin: Secondary | ICD-10-CM | POA: Diagnosis not present

## 2019-08-30 DIAGNOSIS — I6529 Occlusion and stenosis of unspecified carotid artery: Secondary | ICD-10-CM | POA: Diagnosis not present

## 2019-08-30 DIAGNOSIS — D649 Anemia, unspecified: Secondary | ICD-10-CM | POA: Diagnosis not present

## 2019-08-30 DIAGNOSIS — S22079D Unspecified fracture of T9-T10 vertebra, subsequent encounter for fracture with routine healing: Secondary | ICD-10-CM | POA: Diagnosis not present

## 2019-08-30 DIAGNOSIS — M47816 Spondylosis without myelopathy or radiculopathy, lumbar region: Secondary | ICD-10-CM | POA: Diagnosis not present

## 2019-08-30 DIAGNOSIS — S22089D Unspecified fracture of T11-T12 vertebra, subsequent encounter for fracture with routine healing: Secondary | ICD-10-CM | POA: Diagnosis not present

## 2019-08-30 DIAGNOSIS — I10 Essential (primary) hypertension: Secondary | ICD-10-CM | POA: Diagnosis not present

## 2019-08-30 DIAGNOSIS — M5136 Other intervertebral disc degeneration, lumbar region: Secondary | ICD-10-CM | POA: Diagnosis not present

## 2019-08-30 DIAGNOSIS — S32019D Unspecified fracture of first lumbar vertebra, subsequent encounter for fracture with routine healing: Secondary | ICD-10-CM | POA: Diagnosis not present

## 2019-08-30 DIAGNOSIS — I739 Peripheral vascular disease, unspecified: Secondary | ICD-10-CM | POA: Diagnosis not present

## 2019-08-30 DIAGNOSIS — M8580 Other specified disorders of bone density and structure, unspecified site: Secondary | ICD-10-CM | POA: Diagnosis not present

## 2019-09-05 DIAGNOSIS — I10 Essential (primary) hypertension: Secondary | ICD-10-CM | POA: Diagnosis not present

## 2019-09-05 DIAGNOSIS — M47816 Spondylosis without myelopathy or radiculopathy, lumbar region: Secondary | ICD-10-CM | POA: Diagnosis not present

## 2019-09-05 DIAGNOSIS — D649 Anemia, unspecified: Secondary | ICD-10-CM | POA: Diagnosis not present

## 2019-09-05 DIAGNOSIS — I739 Peripheral vascular disease, unspecified: Secondary | ICD-10-CM | POA: Diagnosis not present

## 2019-09-05 DIAGNOSIS — M5136 Other intervertebral disc degeneration, lumbar region: Secondary | ICD-10-CM | POA: Diagnosis not present

## 2019-09-05 DIAGNOSIS — S32019D Unspecified fracture of first lumbar vertebra, subsequent encounter for fracture with routine healing: Secondary | ICD-10-CM | POA: Diagnosis not present

## 2019-09-05 DIAGNOSIS — S22079D Unspecified fracture of T9-T10 vertebra, subsequent encounter for fracture with routine healing: Secondary | ICD-10-CM | POA: Diagnosis not present

## 2019-09-05 DIAGNOSIS — I6529 Occlusion and stenosis of unspecified carotid artery: Secondary | ICD-10-CM | POA: Diagnosis not present

## 2019-09-05 DIAGNOSIS — M8580 Other specified disorders of bone density and structure, unspecified site: Secondary | ICD-10-CM | POA: Diagnosis not present

## 2019-09-05 DIAGNOSIS — S22089D Unspecified fracture of T11-T12 vertebra, subsequent encounter for fracture with routine healing: Secondary | ICD-10-CM | POA: Diagnosis not present

## 2019-09-13 DIAGNOSIS — I739 Peripheral vascular disease, unspecified: Secondary | ICD-10-CM | POA: Diagnosis not present

## 2019-09-13 DIAGNOSIS — S32019D Unspecified fracture of first lumbar vertebra, subsequent encounter for fracture with routine healing: Secondary | ICD-10-CM | POA: Diagnosis not present

## 2019-09-13 DIAGNOSIS — I10 Essential (primary) hypertension: Secondary | ICD-10-CM | POA: Diagnosis not present

## 2019-09-13 DIAGNOSIS — M47816 Spondylosis without myelopathy or radiculopathy, lumbar region: Secondary | ICD-10-CM | POA: Diagnosis not present

## 2019-09-13 DIAGNOSIS — I6529 Occlusion and stenosis of unspecified carotid artery: Secondary | ICD-10-CM | POA: Diagnosis not present

## 2019-09-13 DIAGNOSIS — D649 Anemia, unspecified: Secondary | ICD-10-CM | POA: Diagnosis not present

## 2019-09-13 DIAGNOSIS — S22089D Unspecified fracture of T11-T12 vertebra, subsequent encounter for fracture with routine healing: Secondary | ICD-10-CM | POA: Diagnosis not present

## 2019-09-13 DIAGNOSIS — M8580 Other specified disorders of bone density and structure, unspecified site: Secondary | ICD-10-CM | POA: Diagnosis not present

## 2019-09-13 DIAGNOSIS — M5136 Other intervertebral disc degeneration, lumbar region: Secondary | ICD-10-CM | POA: Diagnosis not present

## 2019-09-13 DIAGNOSIS — S22079D Unspecified fracture of T9-T10 vertebra, subsequent encounter for fracture with routine healing: Secondary | ICD-10-CM | POA: Diagnosis not present

## 2019-09-15 ENCOUNTER — Ambulatory Visit: Payer: Medicare HMO

## 2019-09-15 ENCOUNTER — Telehealth: Payer: Self-pay | Admitting: Family Medicine

## 2019-09-15 ENCOUNTER — Other Ambulatory Visit: Payer: Self-pay

## 2019-09-15 ENCOUNTER — Ambulatory Visit (INDEPENDENT_AMBULATORY_CARE_PROVIDER_SITE_OTHER): Payer: Medicare HMO

## 2019-09-15 ENCOUNTER — Other Ambulatory Visit: Payer: Medicare HMO

## 2019-09-15 DIAGNOSIS — E7800 Pure hypercholesterolemia, unspecified: Secondary | ICD-10-CM

## 2019-09-15 DIAGNOSIS — I10 Essential (primary) hypertension: Secondary | ICD-10-CM

## 2019-09-15 DIAGNOSIS — E559 Vitamin D deficiency, unspecified: Secondary | ICD-10-CM

## 2019-09-15 DIAGNOSIS — E039 Hypothyroidism, unspecified: Secondary | ICD-10-CM

## 2019-09-15 DIAGNOSIS — D649 Anemia, unspecified: Secondary | ICD-10-CM

## 2019-09-15 DIAGNOSIS — Z0000 Encounter for general adult medical examination without abnormal findings: Secondary | ICD-10-CM | POA: Diagnosis not present

## 2019-09-15 DIAGNOSIS — E538 Deficiency of other specified B group vitamins: Secondary | ICD-10-CM

## 2019-09-15 NOTE — Progress Notes (Signed)
PCP notes:  Health Maintenance: No gaps noted   Abnormal Screenings: none   Patient concerns: Patient has right knee pain. Onset 2-3 years ago.   Nurse concerns: none   Next PCP appt.: 09/22/2019 @ 12:15 pm

## 2019-09-15 NOTE — Telephone Encounter (Signed)
-----   Message from Ellamae Sia sent at 09/12/2019  3:35 PM EST ----- Regarding: Lab orders for Thursday, 1.7.21  AWV lab orders, please.

## 2019-09-15 NOTE — Patient Instructions (Signed)
[name] , Thank you for taking time to come for your Medicare Wellness Visit. I appreciate your ongoing commitment to your health goals. Please review the following plan we discussed and let me know if I can assist you in the future.   Screening recommendations/referrals: Colonoscopy: no longer required Recommended yearly ophthalmology/optometry visit for glaucoma screening and checkup Recommended yearly dental visit for hygiene and checkup  Vaccinations: Influenza vaccine: Up to date, completed 06/17/2019 Pneumococcal vaccine: Completed series Tdap vaccine: Up to date, completed 12/31/2016 Shingles vaccine: discussed    Advanced directives: copy in chart   Conditions/risks identified: hypertension, hypercholesterolemia  Next appointment: 09/22/2019 @ 12:15 pm   Preventive Care 37 Years and Older, Male Preventive care refers to lifestyle choices and visits with your health care provider that can promote health and wellness. What does preventive care include?  A yearly physical exam. This is also called an annual well check.  Dental exams once or twice a year.  Routine eye exams. Ask your health care provider how often you should have your eyes checked.  Personal lifestyle choices, including:  Daily care of your teeth and gums.  Regular physical activity.  Eating a healthy diet.  Avoiding tobacco and drug use.  Limiting alcohol use.  Practicing safe sex.  Taking low doses of aspirin every day.  Taking vitamin and mineral supplements as recommended by your health care provider. What happens during an annual well check? The services and screenings done by your health care provider during your annual well check will depend on your age, overall health, lifestyle risk factors, and family history of disease. Counseling  Your health care provider may ask you questions about your:  Alcohol use.  Tobacco use.  Drug use.  Emotional well-being.  Home and relationship  well-being.  Sexual activity.  Eating habits.  History of falls.  Memory and ability to understand (cognition).  Work and work Statistician. Screening  You may have the following tests or measurements:  Height, weight, and BMI.  Blood pressure.  Lipid and cholesterol levels. These may be checked every 5 years, or more frequently if you are over 18 years old.  Skin check.  Lung cancer screening. You may have this screening every year starting at age 49 if you have a 30-pack-year history of smoking and currently smoke or have quit within the past 15 years.  Fecal occult blood test (FOBT) of the stool. You may have this test every year starting at age 52.  Flexible sigmoidoscopy or colonoscopy. You may have a sigmoidoscopy every 5 years or a colonoscopy every 10 years starting at age 67.  Prostate cancer screening. Recommendations will vary depending on your family history and other risks.  Hepatitis C blood test.  Hepatitis B blood test.  Sexually transmitted disease (STD) testing.  Diabetes screening. This is done by checking your blood sugar (glucose) after you have not eaten for a while (fasting). You may have this done every 1-3 years.  Abdominal aortic aneurysm (AAA) screening. You may need this if you are a current or former smoker.  Osteoporosis. You may be screened starting at age 42 if you are at high risk. Talk with your health care provider about your test results, treatment options, and if necessary, the need for more tests. Vaccines  Your health care provider may recommend certain vaccines, such as:  Influenza vaccine. This is recommended every year.  Tetanus, diphtheria, and acellular pertussis (Tdap, Td) vaccine. You may need a Td booster every 10  years.  Zoster vaccine. You may need this after age 32.  Pneumococcal 13-valent conjugate (PCV13) vaccine. One dose is recommended after age 27.  Pneumococcal polysaccharide (PPSV23) vaccine. One dose is  recommended after age 67. Talk to your health care provider about which screenings and vaccines you need and how often you need them. This information is not intended to replace advice given to you by your health care provider. Make sure you discuss any questions you have with your health care provider. Document Released: 09/21/2015 Document Revised: 05/14/2016 Document Reviewed: 06/26/2015 Elsevier Interactive Patient Education  2017 [place] Prevention in the Home Falls can cause injuries. They can happen to people of all ages. There are many things you can do to make your home safe and to help prevent falls. What can I do on the outside of my home?  Regularly fix the edges of walkways and driveways and fix any cracks.  Remove anything that might make you trip as you walk through a door, such as a raised step or threshold.  Trim any bushes or trees on the path to your home.  Use bright outdoor lighting.  Clear any walking paths of anything that might make someone trip, such as rocks or tools.  Regularly check to see if handrails are loose or broken. Make sure that both sides of any steps have handrails.  Any raised decks and porches should have guardrails on the edges.  Have any leaves, snow, or ice cleared regularly.  Use sand or salt on walking paths during winter.  Clean up any spills in your garage right away. This includes oil or grease spills. What can I do in the bathroom?  Use night lights.  Install grab bars by the toilet and in the tub and shower. Do not use towel bars as grab bars.  Use non-skid mats or decals in the tub or shower.  If you need to sit down in the shower, use a plastic, non-slip stool.  Keep the floor dry. Clean up any water that spills on the floor as soon as it happens.  Remove soap buildup in the tub or shower regularly.  Attach bath mats securely with double-sided non-slip rug tape.  Do not have throw rugs and other things on  the floor that can make you trip. What can I do in the bedroom?  Use night lights.  Make sure that you have a light by your bed that is easy to reach.  Do not use any sheets or blankets that are too big for your bed. They should not hang down onto the floor.  Have a firm chair that has side arms. You can use this for support while you get dressed.  Do not have throw rugs and other things on the floor that can make you trip. What can I do in the kitchen?  Clean up any spills right away.  Avoid walking on wet floors.  Keep items that you use a lot in easy-to-reach places.  If you need to reach something above you, use a strong step stool that has a grab bar.  Keep electrical cords out of the way.  Do not use floor polish or wax that makes floors slippery. If you must use wax, use non-skid floor wax.  Do not have throw rugs and other things on the floor that can make you trip. What can I do with my stairs?  Do not leave any items on the stairs.  Make sure that  there are handrails on both sides of the stairs and use them. Fix handrails that are broken or loose. Make sure that handrails are as long as the stairways.  Check any carpeting to make sure that it is firmly attached to the stairs. Fix any carpet that is loose or worn.  Avoid having throw rugs at the top or bottom of the stairs. If you do have throw rugs, attach them to the floor with carpet tape.  Make sure that you have a light switch at the top of the stairs and the bottom of the stairs. If you do not have them, ask someone to add them for you. What else can I do to help prevent falls?  Wear shoes that:  Do not have high heels.  Have rubber bottoms.  Are comfortable and fit you well.  Are closed at the toe. Do not wear sandals.  If you use a stepladder:  Make sure that it is fully opened. Do not climb a closed stepladder.  Make sure that both sides of the stepladder are locked into place.  Ask someone to  hold it for you, if possible.  Clearly mark and make sure that you can see:  Any grab bars or handrails.  First and last steps.  Where the edge of each step is.  Use tools that help you move around (mobility aids) if they are needed. These include:  Canes.  Walkers.  Scooters.  Crutches.  Turn on the lights when you go into a dark area. Replace any light bulbs as soon as they burn out.  Set up your furniture so you have a clear path. Avoid moving your furniture around.  If any of your floors are uneven, fix them.  If there are any pets around you, be aware of where they are.  Review your medicines with your doctor. Some medicines can make you feel dizzy. This can increase your chance of falling. Ask your doctor what other things that you can do to help prevent falls. This information is not intended to replace advice given to you by your health care provider. Make sure you discuss any questions you have with your health care provider. Document Released: 06/21/2009 Document Revised: 01/31/2016 Document Reviewed: 09/29/2014 Elsevier Interactive Patient Education  2017 [employer].

## 2019-09-15 NOTE — Progress Notes (Signed)
Subjective:   [name] is a 86 y.o. male who presents for Medicare Annual/Subsequent preventive examination.  Review of Systems: N/A   This visit is being conducted through telemedicine via telephone at the nurse health advisor's home address due to the COVID-19 pandemic. This patient has given me verbal consent via doximity to conduct this visit, patient states they are participating from their home address. Patient and myself are on the telephone call. There is no referral for this visit. Some vital signs may be absent or patient reported.    Patient identification: identified by name, DOB, and current address   Cardiac Risk Factors include: advanced age (>11men, >73 women);hypertension;male gender;Other (see comment), Risk factor comments: hypercholesterolemia     Objective:    Vitals: There were no vitals taken for this visit.  There is no height or weight on file to calculate BMI.  Advanced Directives 09/15/2019 06/15/2019 09/06/2018 08/29/2016  Does Patient Have a Medical Advance Directive? Yes Yes Yes Yes  Type of Paramedic of [place];Living will Healthcare Power of [place];Living will [place];Living will  Does patient want to make changes to medical advance directive? - No - Patient declined - -  Copy of [place] in Chart? Yes - validated most recent copy scanned in chart (See row information) - No - copy requested No - copy requested    Tobacco Social History   Tobacco Use  Smoking Status Never Smoker  Smokeless Tobacco Never Used     Counseling given: Not Answered   Clinical Intake:  Pre-visit preparation completed: Yes  Pain : No/denies pain     Nutritional Risks: None Diabetes: No  How often do you need to have someone help you when you read instructions, pamphlets, or other written materials from your doctor or pharmacy?: 1 - Never What is the last grade  level you completed in school?: college  Interpreter Needed?: No  Information entered by :: CJohnson, LPN  Past Medical History:  Diagnosis Date  . Cancer [facility])    prostate  . Carotid artery stenosis   . Degenerative disc disease   . Fractures    compression  . History of prostate cancer   . Hyperlipidemia   . Hypertension   . Kyphosis   . Non-cardiac chest pain 04/11   hospital ? [facility]   Past Surgical History:  Procedure Laterality Date  . CARDIOVASCULAR STRESS TEST  2004   cardiolite negative  . carotid doppler  11/05   0-39% bilatteral  . herniated disc surgery  1990   L3-4  . [place]   right  . prostate cancer surgery    . PROSTATE SURGERY    . TONSILLECTOMY     Family History  Problem Relation Age of Onset  . Hypertension Father    Social History   Socioeconomic History  . Marital status: Married    Spouse name: Not on file  . Number of children: Not on file  . Years of education: Not on file  . Highest education level: Not on file  Occupational History  . 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 Smoking status: Never Smoker  . Smokeless tobacco: Never Used  Substance and Sexual Activity  . Alcohol use: Yes    Alcohol/week: 6.0 standard drinks    Types: 6 Glasses of wine per  week  . Drug use: No  . Sexual activity: Never  Other Topics Concern  . 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 Difficulty of Paying Living Expenses: Not hard at all  Food Insecurity: No Food Insecurity  . Worried About Charity fundraiser in the Last Year: Never true  . Ran Out of Food in the Last Year: Never true  Transportation Needs: No Transportation Needs  . Lack of Transportation (Medical): No  . Lack of Transportation (Non-Medical): No  Physical Activity:  Inactive  . Days of Exercise per Week: 0 days  . Minutes of Exercise per Session: 0 min  Stress: No Stress Concern Present  . Feeling of Stress : Not at all  Social Connections:   . Frequency of Communication with Friends and Family: Not on file  . Frequency of Social Gatherings with Friends and Family: Not on file  . Attends Religious Services: Not on file  . Active Member of Clubs or Organizations: Not on file  . Attends Archivist Meetings: Not on file  . Marital Status: Not on file    Outpatient Encounter Medications as of 09/15/2019  Medication Sig  . Alpha-D-Galactosidase (BEANO) TABS Take 1 tablet by mouth as needed (before meals, to improve digestion).  Marland Kitchen amLODipine (NORVASC) 5 MG tablet Take 1 tablet (5 mg total) by mouth daily.  Marland Kitchen aspirin EC 81 MG EC tablet Take 81 mg by mouth 3 (three) times a week.   . Cholecalciferol (VITAMIN D-3) 25 MCG (1000 UT) CAPS Take 1,000-2,000 Units by mouth daily.  Marland Kitchen guaiFENesin (MUCINEX) 600 MG 12 hr tablet Take 600 mg by mouth 2 (two) times daily as needed for to loosen phlegm.   Marland Kitchen levothyroxine (SYNTHROID, LEVOTHROID) 25 MCG tablet Take 1 tablet (25 mcg total) by mouth daily before breakfast.  . oxybutynin (DITROPAN-XL) 5 MG 24 hr tablet Take 1 tablet (5 mg total) by mouth at bedtime.  . SELENIUM PO Take 1 tablet by mouth daily with breakfast.   . vitamin B-12 (CYANOCOBALAMIN) 100 MCG tablet Take 1 tablet (100 mcg total) by mouth every other day.   No facility-administered encounter medications on file as of 09/15/2019.    Activities of Daily Living In your present state of health, do you have any difficulty performing the following activities: 09/15/2019 06/15/2019  Hearing? Y Y  Comment hearing loss noted -  Vision? N N  Difficulty concentrating or making decisions? N N  Walking or climbing stairs? Y Y  Comment don't climb stairs -  Dressing or bathing? N Y  Doing errands, shopping? N Y  Conservation officer, nature and eating ? N -  Using the  Toilet? N -  In the past six months, have you accidently leaked urine? N -  Comment prostate was removed -  Do you have problems with loss of bowel control? N -  Managing your Medications? N -  Managing your Finances? N -  Housekeeping or managing your Housekeeping? N -  Some recent data might be hidden    Patient Care Team: Tower, Wynelle Fanny, MD as PCP - General Dingeldein, Remo Lipps, MD as Consulting Physician (Ophthalmology) Almedia Balls, MD as Consulting Physician (Orthopedic Surgery) Aldona Lento, DDS as Consulting Physician (Dentistry)   Assessment:   This is a routine wellness examination for [place].  Exercise Activities and Dietary  recommendations Current Exercise Habits: Home exercise routine, Type of exercise: strength training/weights, Time (Minutes): 30, Frequency (Times/Week): 4, Weekly Exercise (Minutes/Week): 120, Intensity: Mild, Exercise limited by: None identified  Goals    . Patient Stated     Starting 09/06/2018, I will continue to take medications as prescribed.     . Patient Stated     09/15/2019, I will maintain and continue medications as prescribed.        Fall Risk Fall Risk  09/15/2019 09/06/2018 09/11/2017 08/29/2016 08/24/2015  Falls in the past year? 1 1 Yes Yes No  Number falls in past yr: 1 1 2  or more 2 or more -  Injury with Fall? 0 1 No No -  Risk Factor Category  - - High Fall Risk High Fall Risk -  Risk for fall due to : Medication side effect Impaired balance/gait;Impaired mobility;Impaired vision;History of fall(s) - - -  Risk for fall due to: Comment - - - - -  Follow up Falls evaluation completed;Falls prevention discussed - - Falls prevention discussed -   Is the patient's home free of loose throw rugs in walkways, pet beds, electrical cords, etc?   yes      Grab bars in the bathroom? yes      Handrails on the stairs?   yes      Adequate lighting?   yes  Timed Get Up and Go Performed: N/A  Depression Screen PHQ 2/9 Scores 09/15/2019  09/06/2018 09/11/2017 08/29/2016  PHQ - 2 Score 0 0 1 0  PHQ- 9 Score 0 0 - -    Cognitive Function MMSE - Mini Mental State Exam 09/15/2019 09/06/2018 08/29/2016  Orientation to time 5 5 5   Orientation to Place 5 5 5   Registration 3 3 3   Attention/ Calculation 5 0 0  Recall 3 3 3   Language- name 2 objects - 0 0  Language- repeat 1 1 1   Language- follow 3 step command - 3 3  Language- read & follow direction - 0 0  Write a sentence - 0 0  Copy design - 0 0  Total score - 20 20  Mini Cog  Mini-Cog screen was completed. Maximum score is 22. A value of 0 denotes this part of the MMSE was not completed or the patient failed this part of the Mini-Cog screening.       Immunization History  Administered Date(s) Administered  . Fluad Quad(high Dose 65+) 06/17/2019  . Influenza Split 06/05/2011  . Influenza Whole 05/18/2012  . Influenza, High Dose Seasonal PF 06/13/2017, 06/03/2018  . Influenza,inj,Quad PF,6+ Mos 06/03/2016  . Influenza-Unspecified 05/25/2013, 05/30/2014  . Pneumococcal Conjugate-13 08/14/2014  . Pneumococcal Polysaccharide-23 11/08/2002  . Td 09/08/2000, 06/14/2012  . Tdap 12/31/2016  . Zoster 07/08/2005    Qualifies for Shingles Vaccine? Yes  Screening Tests Health Maintenance  Topic Date Due  . TETANUS/TDAP  01/01/2027  . INFLUENZA VACCINE  Completed  . PNA vac Low Risk Adult  Completed   Cancer Screenings: Lung: Low Dose CT Chest recommended if Age 69-80 years, 30 pack-year currently smoking OR have quit w/in 15 years. Patient does not qualify. Colorectal: no longer required  Additional Screenings:  Hepatitis C Screening: N/A      Plan:   Patient will maintain and continue medications as prescribed.   I have personally reviewed and noted the following in the patient's chart:   . Medical and social history . Use of alcohol, tobacco or illicit drugs  . Current medications  and supplements . Functional ability and status . Nutritional status .  Physical activity . Advanced directives . List of other physicians . Hospitalizations, surgeries, and ER visits in previous 12 months . Vitals . Screenings to include cognitive, depression, and falls . Referrals and appointments  In addition, I have reviewed and discussed with patient certain preventive protocols, quality metrics, and best practice recommendations. A written personalized care plan for preventive services as well as general preventive health recommendations were provided to patient.     Andrez Grime, LPN  [phone]

## 2019-09-20 ENCOUNTER — Other Ambulatory Visit (INDEPENDENT_AMBULATORY_CARE_PROVIDER_SITE_OTHER): Payer: Medicare HMO

## 2019-09-20 DIAGNOSIS — E559 Vitamin D deficiency, unspecified: Secondary | ICD-10-CM | POA: Diagnosis not present

## 2019-09-20 DIAGNOSIS — I10 Essential (primary) hypertension: Secondary | ICD-10-CM | POA: Diagnosis not present

## 2019-09-20 DIAGNOSIS — E7800 Pure hypercholesterolemia, unspecified: Secondary | ICD-10-CM

## 2019-09-20 DIAGNOSIS — E039 Hypothyroidism, unspecified: Secondary | ICD-10-CM | POA: Diagnosis not present

## 2019-09-20 DIAGNOSIS — D649 Anemia, unspecified: Secondary | ICD-10-CM | POA: Diagnosis not present

## 2019-09-20 DIAGNOSIS — E538 Deficiency of other specified B group vitamins: Secondary | ICD-10-CM | POA: Diagnosis not present

## 2019-09-20 LAB — CBC WITH DIFFERENTIAL/PLATELET
Basophils Absolute: 0 10*3/uL (ref 0.0–0.1)
Basophils Relative: 0.2 % (ref 0.0–3.0)
Eosinophils Absolute: 0.2 10*3/uL (ref 0.0–0.7)
Eosinophils Relative: 2.8 % (ref 0.0–5.0)
HCT: 34 % — ABNORMAL LOW (ref 39.0–52.0)
Hemoglobin: 11.5 g/dL — ABNORMAL LOW (ref 13.0–17.0)
Lymphocytes Relative: 12.7 % (ref 12.0–46.0)
Lymphs Abs: 1.1 10*3/uL (ref 0.7–4.0)
MCHC: 33.9 g/dL (ref 30.0–36.0)
MCV: 96.8 fl (ref 78.0–100.0)
Monocytes Absolute: 1.2 10*3/uL — ABNORMAL HIGH (ref 0.1–1.0)
Monocytes Relative: 13.1 % — ABNORMAL HIGH (ref 3.0–12.0)
Neutro Abs: 6.3 10*3/uL (ref 1.4–7.7)
Neutrophils Relative %: 71.2 % (ref 43.0–77.0)
Platelets: 324 10*3/uL (ref 150.0–400.0)
RBC: 3.51 Mil/uL — ABNORMAL LOW (ref 4.22–5.81)
RDW: 12.9 % (ref 11.5–15.5)
WBC: 8.8 10*3/uL (ref 4.0–10.5)

## 2019-09-20 LAB — VITAMIN B12: Vitamin B-12: 833 pg/mL (ref 211–911)

## 2019-09-20 LAB — COMPREHENSIVE METABOLIC PANEL
ALT: 11 U/L (ref 0–53)
AST: 15 U/L (ref 0–37)
Albumin: 3.7 g/dL (ref 3.5–5.2)
Alkaline Phosphatase: 60 U/L (ref 39–117)
BUN: 29 mg/dL — ABNORMAL HIGH (ref 6–23)
CO2: 29 mEq/L (ref 19–32)
Calcium: 9 mg/dL (ref 8.4–10.5)
Chloride: 103 mEq/L (ref 96–112)
Creatinine, Ser: 1.22 mg/dL (ref 0.40–1.50)
GFR: 54.91 mL/min — ABNORMAL LOW (ref >60.00)
Glucose, Bld: 94 mg/dL (ref 70–99)
Potassium: 4.6 mEq/L (ref 3.5–5.1)
Sodium: 137 mEq/L (ref 135–145)
Total Bilirubin: 0.3 mg/dL (ref 0.2–1.2)
Total Protein: 6 g/dL (ref 6.0–8.3)

## 2019-09-20 LAB — LIPID PANEL
Cholesterol: 161 mg/dL (ref 0–200)
HDL: 36.1 mg/dL — ABNORMAL LOW (ref >39.00)
LDL Cholesterol: 100 mg/dL — ABNORMAL HIGH (ref 0–99)
NonHDL: 124.54
Total CHOL/HDL Ratio: 4
Triglycerides: 124 mg/dL (ref 0.0–149.0)
VLDL: 24.8 mg/dL (ref 0.0–40.0)

## 2019-09-20 LAB — VITAMIN D 25 HYDROXY (VIT D DEFICIENCY, FRACTURES): VITD: 34.58 ng/mL (ref 30.00–100.00)

## 2019-09-20 LAB — TSH: TSH: 5.3 u[IU]/mL — ABNORMAL HIGH (ref 0.35–4.50)

## 2019-09-22 ENCOUNTER — Encounter: Payer: Self-pay | Admitting: Family Medicine

## 2019-09-22 ENCOUNTER — Other Ambulatory Visit: Payer: Medicare HMO | Admitting: Hospice

## 2019-09-22 ENCOUNTER — Encounter: Payer: Medicare HMO | Admitting: Family Medicine

## 2019-09-22 ENCOUNTER — Other Ambulatory Visit: Payer: Self-pay

## 2019-09-22 ENCOUNTER — Ambulatory Visit (INDEPENDENT_AMBULATORY_CARE_PROVIDER_SITE_OTHER): Payer: Medicare HMO | Admitting: Family Medicine

## 2019-09-22 VITALS — BP 124/60 | HR 54 | Temp 97.6°F | Wt 148.5 lb

## 2019-09-22 DIAGNOSIS — Z8781 Personal history of (healed) traumatic fracture: Secondary | ICD-10-CM

## 2019-09-22 DIAGNOSIS — I6529 Occlusion and stenosis of unspecified carotid artery: Secondary | ICD-10-CM | POA: Diagnosis not present

## 2019-09-22 DIAGNOSIS — I10 Essential (primary) hypertension: Secondary | ICD-10-CM

## 2019-09-22 DIAGNOSIS — Z0000 Encounter for general adult medical examination without abnormal findings: Secondary | ICD-10-CM | POA: Diagnosis not present

## 2019-09-22 DIAGNOSIS — M47816 Spondylosis without myelopathy or radiculopathy, lumbar region: Secondary | ICD-10-CM | POA: Diagnosis not present

## 2019-09-22 DIAGNOSIS — Z515 Encounter for palliative care: Secondary | ICD-10-CM

## 2019-09-22 DIAGNOSIS — E7800 Pure hypercholesterolemia, unspecified: Secondary | ICD-10-CM

## 2019-09-22 DIAGNOSIS — M8588 Other specified disorders of bone density and structure, other site: Secondary | ICD-10-CM | POA: Diagnosis not present

## 2019-09-22 DIAGNOSIS — D649 Anemia, unspecified: Secondary | ICD-10-CM | POA: Diagnosis not present

## 2019-09-22 DIAGNOSIS — M8580 Other specified disorders of bone density and structure, unspecified site: Secondary | ICD-10-CM | POA: Diagnosis not present

## 2019-09-22 DIAGNOSIS — E559 Vitamin D deficiency, unspecified: Secondary | ICD-10-CM

## 2019-09-22 DIAGNOSIS — S22078S Other fracture of T9-T10 vertebra, sequela: Secondary | ICD-10-CM

## 2019-09-22 DIAGNOSIS — S32019D Unspecified fracture of first lumbar vertebra, subsequent encounter for fracture with routine healing: Secondary | ICD-10-CM | POA: Diagnosis not present

## 2019-09-22 DIAGNOSIS — Z8546 Personal history of malignant neoplasm of prostate: Secondary | ICD-10-CM

## 2019-09-22 DIAGNOSIS — I739 Peripheral vascular disease, unspecified: Secondary | ICD-10-CM | POA: Diagnosis not present

## 2019-09-22 DIAGNOSIS — E039 Hypothyroidism, unspecified: Secondary | ICD-10-CM

## 2019-09-22 DIAGNOSIS — M5136 Other intervertebral disc degeneration, lumbar region: Secondary | ICD-10-CM | POA: Diagnosis not present

## 2019-09-22 DIAGNOSIS — L602 Onychogryphosis: Secondary | ICD-10-CM

## 2019-09-22 DIAGNOSIS — R531 Weakness: Secondary | ICD-10-CM

## 2019-09-22 DIAGNOSIS — S22089D Unspecified fracture of T11-T12 vertebra, subsequent encounter for fracture with routine healing: Secondary | ICD-10-CM | POA: Diagnosis not present

## 2019-09-22 DIAGNOSIS — S22079D Unspecified fracture of T9-T10 vertebra, subsequent encounter for fracture with routine healing: Secondary | ICD-10-CM | POA: Diagnosis not present

## 2019-09-22 DIAGNOSIS — E538 Deficiency of other specified B group vitamins: Secondary | ICD-10-CM | POA: Diagnosis not present

## 2019-09-22 MED ORDER — AMLODIPINE BESYLATE 5 MG PO TABS: 5.0000 mg | ORAL_TABLET | Freq: Every day | ORAL | 11 refills | Status: AC

## 2019-09-22 MED ORDER — ALENDRONATE SODIUM 70 MG PO TABS: 70.0000 mg | ORAL_TABLET | ORAL | 11 refills | Status: DC

## 2019-09-22 MED ORDER — LEVOTHYROXINE SODIUM 50 MCG PO TABS: 50.0000 ug | ORAL_TABLET | Freq: Every day | ORAL | 11 refills | Status: DC

## 2019-09-22 NOTE — Assessment & Plan Note (Signed)
Suspect anemia of chronic dz  No changes  Will continue to monitor

## 2019-09-22 NOTE — Patient Instructions (Addendum)
A podiatry visit may be a good idea for toe nail care   Go up to 50 mcg of levothyroxine daily  We can re check TSH in about 6 weeks   Think about bedside commode or urinal for night time urination- to prevent falls   A urology visit is always an option for frequent urination   Labs are fairly stable   Alendronate is a weekly medicine to help prevent broken bones from osteoporosis I sent it to the pharmacy  Here is a handout to read on it  If any side effects - stop it and let me know

## 2019-09-22 NOTE — Assessment & Plan Note (Signed)
Less pain now

## 2019-09-22 NOTE — Assessment & Plan Note (Signed)
With hx of TS compression fracture-no longer symptomatic  (fits criteria for osteoporosis) Moderate kyphosis  Not interested in another dexa at this time Open to trial of alendronate weekly - given handout to review  inst to update if side eff (GI or other) One fall recently-disc fall prev  No new fx since T9 Enc exercise as tolerated  Vit D is tx with current intake

## 2019-09-22 NOTE — Assessment & Plan Note (Signed)
Has followed with cardiology  No symptoms or clinical changes

## 2019-09-22 NOTE — Assessment & Plan Note (Signed)
Hypothyroidism  Pt has no clinical changes No change in energy level/ hair or skin/ edema and no tremor Lab Results  Component Value Date   TSH 5.30 (H) 09/20/2019    Will inc levothyroid dose to 50 mcg daily and re check in 6 wk

## 2019-09-22 NOTE — Progress Notes (Signed)
[facility] Consult Note Telephone: [phone]  Fax: [phone]  PATIENT NAME: [name] DOB: [birth year] MRN: [id number]  PRIMARY CARE PROVIDER:   Abner Greenspan, MD  REFERRING PROVIDER:  Abner Greenspan, MD [place],  [place] [address]  [place] contact Jecaryous Toups [place] [phone] TELEHEALTH VISIT STATEMENT Due to the COVID-19 crisis, this visit was done via telephone from my office. It was initiated and consented to by this patient and/or family.  RECOMMENDATIONS/PLAN:  Care Planning/Goals of Care: Telehealth for ongoing building of trust, acknowledge receipt of signed MOST form and follow up on palliative care.  Patient's MOST selections  include to attempt resuscitation, limited additional intervention, antibiotics and IV fluids as indicated and feeding tube for a trial period. Form uploaded in [place]. Goals of care includedto maximize quality of life and symptom management. Symptoms Management: Patient continues to improve, now able to ambulate with a cane around his house. Weakness is improving. He endorsed having a little more energy than before, though overall ' I am slowing down because I am growing older'. He said he ambulates around the hose sometimes with a walker or cane and sometimes without. Ongoing education  on the need for walker/cane each time he wants to stand and walk to help prevent a fall; also stressed on the need for slow position changes. Patient denied pain/discomfort. Encouraged ongoing supportive care.He has annual physical today with PCP.  Follow [id number] care will continue to follow patient for goals of care clarification and symptom management. I spent49minutes providing this consultation, from 11.00am to 11.30am. More than 50% of the time in this consultation was spent coordinating communication. HISTORY OF PRESENT ILLNESS:Fontaine P  Clappis a 86 y.o.year oldmalewith multiple medical problems including HTN, Hypothyroidism, Hx of Prostate CA in remission, fracture of T9. Palliative Care was asked to help address goals of care. CODE STATUS: Full PPS: 40% HOSPICE ELIGIBILITY/DIAGNOSIS: TBD   PAST MEDICAL HISTORY:  Past Medical History:  Diagnosis Date  . Cancer [facility])    prostate  . Carotid artery stenosis   . Degenerative disc disease   . Fractures    compression  . History of prostate cancer   . Hyperlipidemia   . Hypertension   . Kyphosis   . Non-cardiac chest pain 04/11   hospital ? [facility]    SOCIAL HX:  Social History   Tobacco Use  . Smoking status: Never Smoker  . Smokeless tobacco: Never Used  Substance Use Topics  . Alcohol use: Yes    Alcohol/week: 6.0 standard drinks    Types: 6 Glasses of wine per week    ALLERGIES:  Allergies  Allergen Reactions  . Simvastatin Other (See Comments)    Aches and pains     PERTINENT MEDICATIONS:  Outpatient Encounter Medications as of 09/22/2019  Medication Sig  . Alpha-D-Galactosidase (BEANO) TABS Take 1 tablet by mouth as needed (before meals, to improve digestion).  Marland Kitchen amLODipine (NORVASC) 5 MG tablet Take 1 tablet (5 mg total) by mouth daily.  Marland Kitchen aspirin EC 81 MG EC tablet Take 81 mg by mouth 3 (three) times a week.   . Cholecalciferol (VITAMIN D-3) 25 MCG (1000 UT) CAPS Take 1,000-2,000 Units by mouth daily.  Marland Kitchen guaiFENesin (MUCINEX) 600 MG 12 hr tablet Take 600 mg by mouth 2 (two) times daily as needed for to loosen phlegm.   Marland Kitchen levothyroxine (SYNTHROID, LEVOTHROID) 25 MCG tablet Take 1  tablet (25 mcg total) by mouth daily before breakfast.  . oxybutynin (DITROPAN-XL) 5 MG 24 hr tablet Take 1 tablet (5 mg total) by mouth at bedtime.  . SELENIUM PO Take 1 tablet by mouth daily with breakfast.   . vitamin B-12 (CYANOCOBALAMIN) 100 MCG tablet Take 1 tablet (100 mcg total) by mouth every other day.   No facility-administered encounter medications on file  as of 09/22/2019.     Teodoro Spray, NP

## 2019-09-22 NOTE — Assessment & Plan Note (Signed)
Strongly enc pt to get nail trim with podiatrist  Given info- he will let us know if he needs a referral

## 2019-09-22 NOTE — Assessment & Plan Note (Signed)
Stable  H/o vascular dz Does not tolerate statin / or desire aggressive tx at his age Disc goals for lipids and reasons to control them Rev last labs with pt Rev low sat fat diet in detail

## 2019-09-22 NOTE — Progress Notes (Signed)
Subjective:    Patient ID: [name], male    DOB: Dec 23, 1919, 86 y.o.   MRN: [id number]  HPI Here for health maintenance exam and to review chronic medical problems    Wt Readings from Last 3 Encounters:  09/22/19 148 lb 8 oz (67.4 kg)  06/29/19 148 lb 3 oz (67.2 kg)  06/16/19 149 lb 14.6 oz (68 kg)   26.31 kg/m   He feels ok for age/ has slowed down a lot  Getting PT several times per week at home  Stays in the house most of the time  He has tried blowing leaves and had a fall   He gets very persistent about getting things done  For instance going out in the snow/sweeping  Judgement is not always the best  Has moments of confusion at times  Hearing makes communication worse as well  Memory -short term has declined with age (needs more reminders)  Shuffling gait-will work with PT on that issue     Trying to write things down (paper and pens all over)    Pt is palliative care status at home currently  Last note from palliative care : noted weakness was improved slightly They check in by phone and there is sometimes a language barrier   Has his covid vaccine scheduled in 1/26   Had amw on 1/7 No gaps noted   bp is stable today  No cp or palpitations or headaches or edema  No side effects to medicines  BP Readings from Last 3 Encounters:  09/22/19 124/60  06/29/19 128/65  06/17/19 (!) 141/51     H/o PVD and carotid dz  No clinical changes and no problems   Toe nails are harder to trim due to thickening   Vit D def 34.5   B12 def  Lab Results  Component Value Date   VITAMINB12 833 09/20/2019     Hypothyroidism  Pt has no clinical changes No change in energy level/ hair or skin/ edema and no tremor Lab Results  Component Value Date   TSH 5.30 (H) 09/20/2019    He does not think he missed doses    Osteopenia with kyphosis and h/o spinal comp fx Falls -one fall 2 wk ago out trying to blow leaves- made him shaky for a few days  Fell on back  side / no back pain from that  Fractures - none (has baseline comp fx)- brace is too heavy to wear  Supplements  Exercise - walks around the house   Hyperlipidemia Lab Results  Component Value Date   CHOL 161 09/20/2019   CHOL 165 09/06/2018   CHOL 163 09/10/2017   Lab Results  Component Value Date   HDL 36.10 (L) 09/20/2019   HDL 41.60 09/06/2018   HDL 37.00 (L) 09/10/2017   Lab Results  Component Value Date   LDLCALC 100 (H) 09/20/2019   [place] 106 (H) 09/06/2018   LDLCALC 99 09/10/2017   Lab Results  Component Value Date   TRIG 124.0 09/20/2019   TRIG 90.0 09/06/2018   TRIG 137.0 09/10/2017   Lab Results  Component Value Date   CHOLHDL 4 09/20/2019   CHOLHDL 4 09/06/2018   CHOLHDL 4 09/10/2017   No results found for: LDLDIRECT   Intol of statins   Mild chronic anemic a Lab Results  Component Value Date   WBC 8.8 09/20/2019   HGB 11.5 (L) 09/20/2019   HCT 34.0 (L) 09/20/2019   MCV 96.8 09/20/2019  PLT 324.0 09/20/2019     Past h/o prostate cancer  Takes ditropan for overactive bladder  Does urinate every 2-3 hours at night  Does use some depends   Patient Active Problem List   Diagnosis Date Noted  . Goals of care, counseling/discussion   . Palliative care by specialist   . Lower extremity weakness 06/14/2019  . Weakness 06/14/2019  . Hearing loss 09/14/2018  . Thickened nails 09/14/2018  . PVD (peripheral vascular disease) ([place]) 11/05/2017  . Fall at home 09/11/2017  . Poor balance 09/11/2017  . Vitamin D deficiency 08/28/2016  . Routine general medical examination at a health care facility 08/24/2015  . B12 deficiency 08/14/2014  . Low back pain 05/02/2014  . Urinary frequency 05/02/2014  . Encounter for Medicare annual wellness exam 07/27/2013  . Motion sickness 04/04/2013  . Osteopenia 06/14/2012  . Hypothyroid 06/14/2012  . 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 FECAL OCCULT BLOOD 02/01/2010  . ANEMIA, MILD 01/23/2010  . Essential hypertension  02/23/2009  . Carotid artery stenosis 02/23/2009  . SPONDYLOSIS, LUMBAR 02/23/2009  . NECK PAIN, CHRONIC 02/23/2009  . BACK PAIN, CHRONIC 02/23/2009  . H/O compression fracture of spine 02/23/2009  . HYPERCHOLESTEROLEMIA 02/21/2009  . History of prostate cancer 02/21/2009   Past Medical History:  Diagnosis Date  . Cancer [facility])    prostate  . Carotid artery stenosis   . Degenerative disc disease   . Fractures    compression  . History of prostate cancer   . Hyperlipidemia   . Hypertension   . Kyphosis   . Non-cardiac chest pain 04/11   hospital ? [facility]   Past Surgical History:  Procedure Laterality Date  . CARDIOVASCULAR STRESS TEST  2004   cardiolite negative  . carotid doppler  11/05   0-39% bilatteral  . herniated disc surgery  1990   L3-4  . [place]   right  . prostate cancer surgery    . PROSTATE SURGERY    . TONSILLECTOMY     Social History   Tobacco Use  . Smoking status: Never Smoker  . Smokeless tobacco: Never Used  Substance Use Topics  . Alcohol use: Yes    Alcohol/week: 6.0 standard drinks    Types: 6 Glasses of wine per week  . Drug use: No   Family History  Problem Relation Age of Onset  . Hypertension Father    Allergies  Allergen Reactions  . Simvastatin Other (See Comments)    Aches and pains   Current Outpatient Medications on File Prior to Visit  Medication Sig Dispense Refill  . Alpha-D-Galactosidase (BEANO) TABS Take 1 tablet by mouth as needed (before meals, to improve digestion).    Marland Kitchen aspirin EC 81 MG EC tablet Take 81 mg by mouth 3 (three) times a week.     . Cholecalciferol (VITAMIN D-3) 25 MCG (1000 UT) CAPS Take 1,000-2,000 Units by mouth daily.    Marland Kitchen guaiFENesin (MUCINEX) 600 MG 12 hr tablet Take 600 mg by mouth 2 (two) times daily as needed for to loosen phlegm.     Marland Kitchen oxybutynin (DITROPAN-XL) 5 MG 24 hr tablet Take 1 tablet (5 mg total) by mouth at bedtime. 30 tablet 5  . SELENIUM PO Take 1 tablet by mouth  daily with breakfast.     . vitamin B-12 (CYANOCOBALAMIN) 100 MCG tablet Take 1 tablet (100 mcg total) by mouth every other day. 1 tablet 0   No current facility-administered medications on file prior to  visit.     Review of Systems  Constitutional: Positive for fatigue. Negative for activity change, appetite change, fever and unexpected weight change.       Generalized weakness  HENT: Negative for congestion, rhinorrhea, sore throat and trouble swallowing.   Eyes: Negative for pain, redness, itching and visual disturbance.  Respiratory: Negative for cough, chest tightness, shortness of breath and wheezing.   Cardiovascular: Negative for chest pain and palpitations.  Gastrointestinal: Negative for abdominal pain, blood in stool, constipation, diarrhea and nausea.  Endocrine: Negative for cold intolerance, heat intolerance, polydipsia and polyuria.  Genitourinary: Positive for frequency and urgency. Negative for difficulty urinating, dysuria and hematuria.  Musculoskeletal: Positive for back pain. Negative for arthralgias, joint swelling and myalgias.  Skin: Negative for pallor and rash.  Neurological: Negative for dizziness, tremors, weakness, numbness and headaches.       Poor balance  Hematological: Negative for adenopathy. Does not bruise/bleed easily.  Psychiatric/Behavioral: Negative for decreased concentration and dysphoric mood. The patient is not nervous/anxious.        Objective:   Physical Exam Constitutional:      General: He is not in acute distress.    Appearance: Normal appearance. He is well-developed and normal weight. He is not ill-appearing or diaphoretic.     Comments: Frail appearing kyphotic elderly male  HENT:     Head: Normocephalic and atraumatic.     Right Ear: Tympanic membrane, ear canal and external ear normal.     Left Ear: Tympanic membrane, ear canal and external ear normal.     Nose: Nose normal. No congestion.     Mouth/Throat:     Mouth: Mucous  membranes are moist.  Eyes:     General: No scleral icterus.       Right eye: No discharge.        Left eye: No discharge.     Conjunctiva/sclera: Conjunctivae normal.     Pupils: Pupils are equal, round, and reactive to light.  Neck:     Thyroid: No thyromegaly.     Vascular: No carotid bruit or JVD.  Cardiovascular:     Rate and Rhythm: Normal rate and regular rhythm.     Pulses: Normal pulses.     Heart sounds: Normal heart sounds. No gallop.   Pulmonary:     Effort: Pulmonary effort is normal. No respiratory distress.     Breath sounds: Normal breath sounds. No wheezing or rales.     Comments: Good air exch Chest:     Chest wall: No tenderness.  Abdominal:     General: Bowel sounds are normal. There is no distension or abdominal bruit.     Palpations: Abdomen is soft. There is no mass.     Tenderness: There is no abdominal tenderness.     Hernia: No hernia is present.  Musculoskeletal:        General: No tenderness.     Cervical back: Normal range of motion and neck supple. No rigidity. No muscular tenderness.     Right lower leg: No edema.     Left lower leg: No edema.     Comments: Marked kyphosis No spine tenderness  Lymphadenopathy:     Cervical: No cervical adenopathy.  Skin:    General: Skin is warm and dry.     Coloration: Skin is not pale.     Findings: No erythema or rash.     Comments: Lentigines and sks diffusely  Neurological:     Mental Status: He  is alert.     Cranial Nerves: No cranial nerve deficit.     Motor: Weakness present. No abnormal muscle tone.     Coordination: Coordination abnormal.     Gait: Gait normal.     Deep Tendon Reflexes: Reflexes are normal and symmetric. Reflexes normal.     Comments: Generalized weakness and poor balance   Psychiatric:        Mood and Affect: Mood normal.        Cognition and Memory: Cognition and memory normal.     Comments: Helpful daughter present  Pt is a fair historian           Assessment &  Plan:   Problem List Items Addressed This Visit      Cardiovascular and Mediastinum   Essential hypertension    bp in fair control at this time  BP Readings from Last 1 Encounters:  09/22/19 124/60   No changes needed Most recent labs reviewed  Disc lifstyle change with low sodium diet and exercise        Relevant Medications   amLODipine (NORVASC) 5 MG tablet   PVD (peripheral vascular disease) ([place])    Has followed with cardiology  No symptoms or clinical changes      Relevant Medications   amLODipine (NORVASC) 5 MG tablet     Endocrine   Hypothyroid    Hypothyroidism  Pt has no clinical changes No change in energy level/ hair or skin/ edema and no tremor Lab Results  Component Value Date   TSH 5.30 (H) 09/20/2019    Will inc levothyroid dose to 50 mcg daily and re check in 6 wk       Relevant Medications   levothyroxine (SYNTHROID) 50 MCG tablet     Musculoskeletal and Integument   H/O compression fracture of spine    T9 fx in fall of 2020 Recovering       Osteopenia    With hx of TS compression fracture-no longer symptomatic  (fits criteria for osteoporosis) Moderate kyphosis  Not interested in another dexa at this time Open to trial of alendronate weekly - given handout to review  inst to update if side eff (GI or other) One fall recently-disc fall prev  No new fx since T9 Enc exercise as tolerated  Vit D is tx with current intake      RESOLVED: T9 vertebral fracture (HCC)    Less pain now         Other   HYPERCHOLESTEROLEMIA    Stable  H/o vascular dz Does not tolerate statin / or desire aggressive tx at his age Disc goals for lipids and reasons to control them Rev last labs with pt Rev low sat fat diet in detail       Relevant Medications   amLODipine (NORVASC) 5 MG tablet   ANEMIA, MILD    Suspect anemia of chronic dz  No changes  Will continue to monitor      History of prostate cancer    Urinary frequency and incontinence  persists  Taking ditropan which helps freq urination at night - suggested bedside commode or urinal to prevent falls  Pt may consider a urology visit-will let us know       B12 deficiency    Lab Results  Component Value Date   VITAMINB12 833 09/20/2019   takine 100 mcg every other day      Routine general medical examination at a health care facility - Primary  Reviewed health habits including diet and exercise and skin cancer prevention Reviewed appropriate screening tests for age  Also reviewed health mt list, fam hx and immunization status , as well as social and family history   See HPI Labs reviewed  Continues palliative care status (with h/o TS comp fx and adv age)  86 PT for strength and fall prevention  Disc fall prev in detail amw reviewed  Pt has covid vaccine scheduled this mo  Needs help with nail trim - considering podiatrist       Vitamin D deficiency    Vitamin D level is therapeutic with current supplementation (mid 30s)   Disc importance of this to bone and overall health       Thickened nails    Strongly enc pt to get nail trim with podiatrist  Given info- he will let us know if he needs a referral      Weakness    Generalized weakness -worse since T9 fx  Continues home PT  Palliative care status       Palliative care by specialist    Continues palliative care status at home No clinical changes

## 2019-09-22 NOTE — Assessment & Plan Note (Signed)
Lab Results  Component Value Date   VITAMINB12 833 09/20/2019   takine 100 mcg every other day

## 2019-09-22 NOTE — Assessment & Plan Note (Signed)
bp in fair control at this time  BP Readings from Last 1 Encounters:  09/22/19 124/60   No changes needed Most recent labs reviewed  Disc lifstyle change with low sodium diet and exercise

## 2019-09-22 NOTE — Assessment & Plan Note (Signed)
Vitamin D level is therapeutic with current supplementation (mid 30s)   Disc importance of this to bone and overall health

## 2019-09-22 NOTE — Assessment & Plan Note (Signed)
Generalized weakness -worse since T9 fx  Continues home PT  Palliative care status

## 2019-09-22 NOTE — Assessment & Plan Note (Signed)
Continues palliative care status at home No clinical changes

## 2019-09-22 NOTE — Assessment & Plan Note (Signed)
Urinary frequency and incontinence persists  Taking ditropan which helps freq urination at night - suggested bedside commode or urinal to prevent falls  Pt may consider a urology visit-will let us know

## 2019-09-22 NOTE — Assessment & Plan Note (Signed)
Reviewed health habits including diet and exercise and skin cancer prevention Reviewed appropriate screening tests for age  Also reviewed health mt list, fam hx and immunization status , as well as social and family history   See HPI Labs reviewed  Continues palliative care status (with h/o TS comp fx and adv age)  55 PT for strength and fall prevention  Disc fall prev in detail amw reviewed  Pt has covid vaccine scheduled this mo  Needs help with nail trim - considering podiatrist

## 2019-09-22 NOTE — Assessment & Plan Note (Signed)
T9 fx in fall of 2020 Recovering

## 2019-09-25 ENCOUNTER — Other Ambulatory Visit: Payer: Self-pay | Admitting: Family Medicine

## 2019-09-27 DIAGNOSIS — I739 Peripheral vascular disease, unspecified: Secondary | ICD-10-CM | POA: Diagnosis not present

## 2019-09-27 DIAGNOSIS — M8580 Other specified disorders of bone density and structure, unspecified site: Secondary | ICD-10-CM | POA: Diagnosis not present

## 2019-09-27 DIAGNOSIS — D649 Anemia, unspecified: Secondary | ICD-10-CM | POA: Diagnosis not present

## 2019-09-27 DIAGNOSIS — S22079D Unspecified fracture of T9-T10 vertebra, subsequent encounter for fracture with routine healing: Secondary | ICD-10-CM | POA: Diagnosis not present

## 2019-09-27 DIAGNOSIS — S32019D Unspecified fracture of first lumbar vertebra, subsequent encounter for fracture with routine healing: Secondary | ICD-10-CM | POA: Diagnosis not present

## 2019-09-27 DIAGNOSIS — M47816 Spondylosis without myelopathy or radiculopathy, lumbar region: Secondary | ICD-10-CM | POA: Diagnosis not present

## 2019-09-27 DIAGNOSIS — M5136 Other intervertebral disc degeneration, lumbar region: Secondary | ICD-10-CM | POA: Diagnosis not present

## 2019-09-27 DIAGNOSIS — S22089D Unspecified fracture of T11-T12 vertebra, subsequent encounter for fracture with routine healing: Secondary | ICD-10-CM | POA: Diagnosis not present

## 2019-09-27 DIAGNOSIS — I6529 Occlusion and stenosis of unspecified carotid artery: Secondary | ICD-10-CM | POA: Diagnosis not present

## 2019-09-27 DIAGNOSIS — I10 Essential (primary) hypertension: Secondary | ICD-10-CM | POA: Diagnosis not present

## 2019-10-04 DIAGNOSIS — S32019D Unspecified fracture of first lumbar vertebra, subsequent encounter for fracture with routine healing: Secondary | ICD-10-CM | POA: Diagnosis not present

## 2019-10-04 DIAGNOSIS — M5136 Other intervertebral disc degeneration, lumbar region: Secondary | ICD-10-CM | POA: Diagnosis not present

## 2019-10-04 DIAGNOSIS — S22079D Unspecified fracture of T9-T10 vertebra, subsequent encounter for fracture with routine healing: Secondary | ICD-10-CM | POA: Diagnosis not present

## 2019-10-04 DIAGNOSIS — M8580 Other specified disorders of bone density and structure, unspecified site: Secondary | ICD-10-CM | POA: Diagnosis not present

## 2019-10-04 DIAGNOSIS — I6529 Occlusion and stenosis of unspecified carotid artery: Secondary | ICD-10-CM | POA: Diagnosis not present

## 2019-10-04 DIAGNOSIS — I739 Peripheral vascular disease, unspecified: Secondary | ICD-10-CM | POA: Diagnosis not present

## 2019-10-04 DIAGNOSIS — I10 Essential (primary) hypertension: Secondary | ICD-10-CM | POA: Diagnosis not present

## 2019-10-04 DIAGNOSIS — S22089D Unspecified fracture of T11-T12 vertebra, subsequent encounter for fracture with routine healing: Secondary | ICD-10-CM | POA: Diagnosis not present

## 2019-10-04 DIAGNOSIS — M47816 Spondylosis without myelopathy or radiculopathy, lumbar region: Secondary | ICD-10-CM | POA: Diagnosis not present

## 2019-10-04 DIAGNOSIS — D649 Anemia, unspecified: Secondary | ICD-10-CM | POA: Diagnosis not present

## 2019-10-05 ENCOUNTER — Ambulatory Visit: Payer: Medicare HMO

## 2019-10-14 ENCOUNTER — Ambulatory Visit: Payer: Medicare HMO

## 2019-11-15 ENCOUNTER — Other Ambulatory Visit: Payer: Self-pay | Admitting: Family Medicine

## 2019-12-19 ENCOUNTER — Other Ambulatory Visit: Payer: Self-pay

## 2019-12-19 ENCOUNTER — Other Ambulatory Visit: Payer: Medicare HMO | Admitting: Hospice

## 2019-12-19 DIAGNOSIS — Z515 Encounter for palliative care: Secondary | ICD-10-CM

## 2019-12-19 DIAGNOSIS — S22078S Other fracture of T9-T10 vertebra, sequela: Secondary | ICD-10-CM

## 2019-12-19 NOTE — Progress Notes (Signed)
[facility] Consult Note Telephone: [phone]  Fax: [phone]  PATIENT NAME: [name] DOB: 1920-04-25 MRN: [id number]  PRIMARY CARE PROVIDER:   Abner Greenspan, MD  REFERRING PROVIDER:  Abner Greenspan, MD [place],  [place] [address]  [place] contact Momar Sicurella 336 [phone] Delorse Limber [place] STATEMENT Due to the COVID-19 crisis, this visit was done via telephone from my office. It was initiated and consented to by this patient and/or family.  RECOMMENDATIONS/PLAN: Care Planning/Goals of Care: Telehealth for ongoingbuilding oftrust and follow up on palliative care. Patient is a full code. Patient's MOST selections  include to attempt resuscitation, limited additional intervention, antibiotics and IV fluids as indicated and feeding tube for a trial period. Form uploaded in [facility]. Goals of care includedto maximize quality of life and symptom management. Symptoms Management: Patient denies pain/discomfort, no coughing or shortness of breath. He said overall he is doing well with no acute changes; in no distress. Weakness is improving.He said he ambulates around the house sometimes with a walker or cane. Ongoing education on the need for walker/cane for support; also stressed on the need for slow position changes. Encouraged ongoing supportive care.He is compliant with his medications; no complaints today. Follow [id number] care will continue to follow patient for goals of care clarification and symptom management. I spent88minutes providing this consultation; time includes chart review and documentation.More than 50% of the time in this consultation was spent coordinating communication. HISTORY OF PRESENT ILLNESS:Gregory P Clappis a 86 y.o.year oldmalewith multiple medical problems including HTN, Hypothyroidism, Hx of Prostate CA in  remission, fracture of T9. Palliative Care was asked to help address goals of care.  CODE STATUS: Full  PPS: 40% HOSPICE ELIGIBILITY/DIAGNOSIS: TBD  PAST MEDICAL HISTORY:  Past Medical History:  Diagnosis Date  . Cancer [facility])    prostate  . Carotid artery stenosis   . Degenerative disc disease   . Fractures    compression  . History of prostate cancer   . Hyperlipidemia   . Hypertension   . Kyphosis   . Non-cardiac chest pain 04/11   hospital ? [facility]    SOCIAL HX:  Social History   Tobacco Use  . Smoking status: Never Smoker  . Smokeless tobacco: Never Used  Substance Use Topics  . Alcohol use: Yes    Alcohol/week: 6.0 standard drinks    Types: 6 Glasses of wine per week    ALLERGIES:  Allergies  Allergen Reactions  . Simvastatin Other (See Comments)    Aches and pains     PERTINENT MEDICATIONS:  Outpatient Encounter Medications as of 12/19/2019  Medication Sig  . alendronate (FOSAMAX) 70 MG tablet Take 1 tablet (70 mg total) by mouth every 7 (seven) days. Take with a full glass of water on an empty stomach.  . Alpha-D-Galactosidase (BEANO) TABS Take 1 tablet by mouth as needed (before meals, to improve digestion).  Marland Kitchen amLODipine (NORVASC) 5 MG tablet Take 1 tablet (5 mg total) by mouth daily.  Marland Kitchen aspirin EC 81 MG EC tablet Take 81 mg by mouth 3 (three) times a week.   . Cholecalciferol (VITAMIN D-3) 25 MCG (1000 UT) CAPS Take 1,000-2,000 Units by mouth daily.  Marland Kitchen guaiFENesin (MUCINEX) 600 MG 12 hr tablet Take 600 mg by mouth 2 (two) times daily as needed for to loosen phlegm.   Marland Kitchen levothyroxine (SYNTHROID) 50 MCG tablet Take 1 tablet (  50 mcg total) by mouth daily.  Marland Kitchen oxybutynin (DITROPAN-XL) 5 MG 24 hr tablet Take 1 tablet (5 mg total) by mouth at bedtime.  . SELENIUM PO Take 1 tablet by mouth daily with breakfast.   . vitamin B-12 (CYANOCOBALAMIN) 100 MCG tablet Take 1 tablet (100 mcg total) by mouth every other day.   No facility-administered encounter medications  on file as of 12/19/2019.    Teodoro Spray, NP

## 2019-12-27 ENCOUNTER — Other Ambulatory Visit: Payer: Self-pay

## 2019-12-27 ENCOUNTER — Ambulatory Visit: Payer: Medicare HMO | Admitting: Podiatry

## 2019-12-27 ENCOUNTER — Encounter: Payer: Self-pay | Admitting: Podiatry

## 2019-12-27 VITALS — Temp 97.4°F

## 2019-12-27 DIAGNOSIS — M79674 Pain in right toe(s): Secondary | ICD-10-CM | POA: Diagnosis not present

## 2019-12-27 DIAGNOSIS — B351 Tinea unguium: Secondary | ICD-10-CM | POA: Diagnosis not present

## 2019-12-27 DIAGNOSIS — L602 Onychogryphosis: Secondary | ICD-10-CM | POA: Diagnosis not present

## 2019-12-27 DIAGNOSIS — M79675 Pain in left toe(s): Secondary | ICD-10-CM

## 2019-12-27 NOTE — Patient Instructions (Signed)
Onychomycosis/Fungal Toenails  WHAT IS IT? An infection that lies within the keratin of your nail plate that is caused by a fungus.  WHY ME? Fungal infections affect all ages, sexes, races, and creeds.  There may be many factors that predispose you to a fungal infection such as age, coexisting medical conditions such as diabetes, or an autoimmune disease; stress, medications, fatigue, genetics, etc.  Bottom line: fungus thrives in a warm, moist environment and your shoes offer such a location.  IS IT CONTAGIOUS? Theoretically, yes.  You do not want to share shoes, nail clippers or files with someone who has fungal toenails.  Walking around barefoot in the same room or sleeping in the same bed is unlikely to transfer the organism.  It is important to realize, however, that fungus can spread easily from one nail to the next on the same foot.  HOW DO WE TREAT THIS?  There are several ways to treat this condition.  Treatment may depend on many factors such as age, medications, pregnancy, liver and kidney conditions, etc.  It is best to ask your doctor which options are available to you.  1. No treatment.   Unlike many other medical concerns, you can live with this condition.  However for many people this can be a painful condition and may lead to ingrown toenails or a bacterial infection.  It is recommended that you keep the nails cut short to help reduce the amount of fungal nail. 2. Topical treatment.  These range from herbal remedies to prescription strength nail lacquers.  About 40-50% effective, topicals require twice daily application for approximately 9 to 12 months or until an entirely new nail has grown out.  The most effective topicals are medical grade medications available through physicians offices. 3. Oral antifungal medications.  With an 80-90% cure rate, the most common oral medication requires 3 to 4 months of therapy and stays in your system for a year as the new nail grows out.  Oral  antifungal medications do require blood work to make sure it is a safe drug for you.  A liver function panel will be performed prior to starting the medication and after the first month of treatment.  It is important to have the blood work performed to avoid any harmful side effects.  In general, this medication safe but blood work is required. 4. Laser Therapy.  This treatment is performed by applying a specialized laser to the affected nail plate.  This therapy is noninvasive, fast, and non-painful.  It is not covered by insurance and is therefore, out of pocket.  The results have been very good with a 80-95% cure rate.  [facility] is the only practice in the area to offer this therapy. 5. Permanent Nail Avulsion.  Removing the entire nail so that a new nail will not grow back. Peripheral Vascular Disease Peripheral vascular disease (PVD) is a disease of the blood vessels. A simple term for PVD is poor circulation. In most cases, PVD narrows the blood vessels that carry blood from your heart to the rest of your body. This can result in a decreased supply of blood to your arms, legs, and internal organs, like your stomach or kidneys. However, it most often affects a person's lower legs and feet. There are two types of PVD.  Organic PVD. This is the more common type. It is caused by damage to the structure of blood vessels.  Functional PVD. This is caused by conditions that make  blood vessels contract and tighten (spasm). Without treatment, PVD tends to get worse over time. PVD can also lead to acute limb ischemia. This is when an arm or leg suddenly has trouble getting enough blood. This is a medical emergency. What are the causes?  Each type of PVD has many different causes. The most common cause of PVD is buildup of a fatty material (plaque) inside your arteries (atherosclerosis). Small amounts of plaque can break off from the walls of the blood vessels and become lodged in a smaller  artery. This blocks blood flow and can cause acute limb ischemia. Other common causes of PVD include:  Blood clots that form inside of blood vessels.  Injuries to blood vessels.  Diseases that cause inflammation of blood vessels or cause blood vessel spasms.  Health behaviors and health history that increase your risk of developing PVD. What increases the risk? You are more likely to develop this condition if:  You have a family history of PVD.  You have certain medical conditions, including: ? High cholesterol. ? Diabetes. ? High blood pressure (hypertension). ? Coronary heart disease. ? Past problems with blood clots. ? Past injury, such as burns or a broken bone. These may have damaged blood vessels in your limbs. ? Buerger disease. This is caused by inflamed blood vessels in your hands and feet. ? Some forms of arthritis. ? Rare birth defects that affect the arteries in your legs. ? Kidney disease.  You use tobacco or smoke.  You do not get enough exercise.  You are obese.  You are age 84 or older. What are the signs or symptoms? This condition may cause different symptoms. Your symptoms depend on what part of your body is not getting enough blood. Some common signs and symptoms include:  Cramps in your lower legs. This may be a symptom of poor leg circulation (claudication).  Pain and weakness in your legs. This happens while you are physically active but goes away when you rest (intermittent claudication).  Leg pain when at rest.  Leg numbness, tingling, or weakness.  Coldness in a leg or foot, especially when compared with the other leg.  Skin or hair changes. These can include: ? Hair loss. ? Shiny skin. ? Pale or bluish skin. ? Thick toenails.  Inability to get or maintain an erection (erectile dysfunction).  Fatigue. People with PVD are more likely to develop ulcers and sores on their toes, feet, or legs. These may take longer than normal to  heal. How is this diagnosed? This condition is diagnosed based on:  Your signs and symptoms.  A physical exam and your medical history.  Other tests to find out what is causing your PVD and to determine its severity. Tests may include: ? Blood pressure recordings from your arms and legs and measurements of the strength of your pulses (pulse volume recordings). ? Imaging studies using sound waves to take pictures of the blood flow through your blood vessels (Doppler ultrasound). ? Injecting a dye into your blood vessels before having imaging studies using:  X-rays (angiogram or arteriogram).  Computer-generated X-rays (CT angiogram).  A powerful electromagnetic field and a computer (magnetic resonance angiogram or MRA). How is this treated? Treatment for PVD depends on the cause of your condition and how severe your symptoms are. It also depends on your age. Underlying causes need to be treated and controlled. These include long-term (chronic) conditions, such as diabetes, high cholesterol, and high blood pressure. Treatment includes:  Lifestyle changes,  such as: ? Quitting smoking. ? Exercising regularly. ? Following a low-fat, low-cholesterol diet.  Taking medicines, such as: ? Blood thinners to prevent blood clots. ? Medicines to improve blood flow. ? Medicines to improve your blood cholesterol levels.  Surgical procedures, such as: ? A procedure that uses an inflated balloon to open a blocked artery and improve blood flow (angioplasty). ? A procedure to put in a wire mesh tube to keep a blocked artery open (stent implant). ? Surgery to reroute blood flow around a blocked artery (peripheral bypass surgery). ? Surgery to remove dead tissue from an infected wound on the affected limb. ? Amputation. This is surgical removal of the affected limb. It may be necessary in cases of acute limb ischemia where there has been no improvement through medical or surgical treatments. Follow  these instructions at home: Lifestyle  Do not use any products that contain nicotine or tobacco, such as cigarettes and e-cigarettes. If you need help quitting, ask your health care provider.  Lose weight if you are overweight, and maintain a healthy weight as discussed by your health care provider.  Eat a diet that is low in fat and cholesterol. If you need help, ask your health care provider.  Exercise regularly. Ask your health care provider to suggest some good activities for you. General instructions  Take over-the-counter and prescription medicines only as told by your health care provider.  Take good care of your feet: ? Wear comfortable shoes that fit well. ? Check your feet often for any cuts or sores.  Keep all follow-up visits as told by your health care provider. This is important. Contact a health care provider if:  You have cramps in your legs while walking.  You have leg pain when you are at rest.  You have coldness in a leg or foot.  Your skin changes.  You have erectile dysfunction.  You have cuts or sores on your feet that are not healing. Get help right away if:  Your arm or leg turns cold, numb, and blue.  Your arms or legs become red, warm, swollen, painful, or numb.  You have chest pain or trouble breathing.  You suddenly have weakness in your face, arm, or leg.  You become very confused or lose the ability to speak.  You suddenly have a very bad headache or lose your vision. Summary  Peripheral vascular disease (PVD) is a disease of the blood vessels.  In most cases, PVD narrows the blood vessels that carry blood from your heart to the rest of your body.  PVD may cause different symptoms. Your symptoms depend on what part of your body is not getting enough blood.  Treatment for PVD depends on the cause of your condition and how severe your symptoms are. This information is not intended to replace advice given to you by your health care  provider. Make sure you discuss any questions you have with your health care provider. Document Revised: 08/07/2017 Document Reviewed: 10/02/2016 Elsevier Patient Education  2020 [employer].

## 2019-12-29 NOTE — Progress Notes (Signed)
Subjective: [name] presents today referred by Gregory Greenspan, MD for complaint of painful mycotic nails b/l.  His daughter is present during the visit. States Dad's toenails are extremely thick and discolored. Duration is greater than several months. Impossible to trim with standard nail clippers. Toes are becoming more painful in shoe gear due to thickness. Now interfering with his ability to ambulate comfortably.  Past Medical History:  Diagnosis Date  . Cancer [facility])    prostate  . Carotid artery stenosis   . Degenerative disc disease   . Fractures    compression  . History of prostate cancer   . Hyperlipidemia   . Hypertension   . Kyphosis   . Non-cardiac chest pain 04/11   hospital ? [facility]     Patient Active Problem List   Diagnosis Date Noted  . Goals of care, counseling/discussion   . Palliative care by specialist   . Lower extremity weakness 06/14/2019  . Weakness 06/14/2019  . Hearing loss 09/14/2018  . Thickened nails 09/14/2018  . PVD (peripheral vascular disease) ([place]) 11/05/2017  . Fall at home 09/11/2017  . Poor balance 09/11/2017  . Vitamin D deficiency 08/28/2016  . Routine general medical examination at a health care facility 08/24/2015  . B12 deficiency 08/14/2014  . Low back pain 05/02/2014  . Urinary frequency 05/02/2014  . Encounter for Medicare annual wellness exam 07/27/2013  . Motion sickness 04/04/2013  . Osteopenia 06/14/2012  . Hypothyroid 06/14/2012  . 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 FECAL OCCULT BLOOD 02/01/2010  . ANEMIA, MILD 01/23/2010  . Essential hypertension 02/23/2009  . Carotid artery stenosis 02/23/2009  . SPONDYLOSIS, LUMBAR 02/23/2009  . NECK PAIN, CHRONIC 02/23/2009  . BACK PAIN, CHRONIC 02/23/2009  . H/O compression fracture of spine 02/23/2009  . HYPERCHOLESTEROLEMIA 02/21/2009  . History of prostate cancer 02/21/2009     Past Surgical History:  Procedure Laterality Date  . CARDIOVASCULAR STRESS TEST  2004   cardiolite  negative  . carotid doppler  11/05   0-39% bilatteral  . herniated disc surgery  1990   L3-4  . [place]   right  . prostate cancer surgery    . PROSTATE SURGERY    . TONSILLECTOMY       Current Outpatient Medications on File Prior to Visit  Medication Sig Dispense Refill  . alendronate (FOSAMAX) 70 MG tablet Take 1 tablet (70 mg total) by mouth every 7 (seven) days. Take with a full glass of water on an empty stomach. 4 tablet 11  . Alpha-D-Galactosidase (BEANO) TABS Take 1 tablet by mouth as needed (before meals, to improve digestion).    Marland Kitchen amLODipine (NORVASC) 5 MG tablet Take 1 tablet (5 mg total) by mouth daily. 30 tablet 11  . aspirin EC 81 MG EC tablet Take 81 mg by mouth 3 (three) times a week.     . Cholecalciferol (VITAMIN D-3) 25 MCG (1000 UT) CAPS Take 1,000-2,000 Units by mouth daily.    Marland Kitchen guaiFENesin (MUCINEX) 600 MG 12 hr tablet Take 600 mg by mouth 2 (two) times daily as needed for to loosen phlegm.     Marland Kitchen levothyroxine (SYNTHROID) 50 MCG tablet Take 1 tablet (50 mcg total) by mouth daily. 30 tablet 11  . oxybutynin (DITROPAN-XL) 5 MG 24 hr tablet Take 1 tablet (5 mg total) by mouth at bedtime. 30 tablet 5  . SELENIUM PO Take 1 tablet by mouth daily with breakfast.     . vitamin B-12 (  CYANOCOBALAMIN) 100 MCG tablet Take 1 tablet (100 mcg total) by mouth every other day. 1 tablet 0   No current facility-administered medications on file prior to visit.     Allergies  Allergen Reactions  . Simvastatin Other (See Comments)    Aches and pains     Social History   Occupational History  . 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 Smoking status: Never Smoker  . Smokeless tobacco: Never Used  Substance and Sexual Activity  . Alcohol use: Yes    Alcohol/week: 6.0 standard drinks    Types: 6 Glasses of wine per week  . Drug use: No  . Sexual activity: Never     Family History   Problem Relation Age of Onset  . Hypertension Father      Immunization History  Administered Date(s) Administered  . Fluad Quad(high Dose 65+) 06/17/2019  . Influenza Split 06/05/2011  . Influenza Whole 05/18/2012  . Influenza, High Dose Seasonal PF 06/13/2017, 06/03/2018  . Influenza,inj,Quad PF,6+ Mos 06/03/2016  . Influenza-Unspecified 05/25/2013, 05/30/2014  . PFIZER SARS-COV-2 Vaccination 10/06/2019  . Pneumococcal Conjugate-13 08/14/2014  . Pneumococcal Polysaccharide-23 11/08/2002  . Td 09/08/2000, 06/14/2012  . Tdap 12/31/2016  . Zoster 07/08/2005     Objective: Vitals:   12/27/19 1041  Temp: (!) 97.4 F (36.3 C)    Pt is a pleasant 86 y.o. Caucasian male, WD, WN in NAD.  AAO x 3.   Vascular Examination:  Capillary fill time to digits <3 seconds b/l. Faintly palpable DP pulses b/l. Nonpalpable PT pulses b/l. Pedal hair absent b/l Skin temperature gradient within normal limits b/l. Skin temperature gradient warm to cool b/l. Dependent rubor noted b/l. Trace edema noted b/l feet.  Dermatological Examination: Pedal skin is thin shiny, atrophic bilaterally. No open wounds bilaterally. No interdigital macerations bilaterally. Toenails 1-5 b/l elongated, dystrophic, thickened, crumbly with subungual debris and tenderness to dorsal palpation.   Onychogryphotic nails of b/l great toes growing in vertical direction. Tenderness when palpated. No erythema, no edema, no drainage, no flocculence.  Musculoskeletal: Normal muscle strength 5/5 to all lower extremity muscle groups bilaterally, no pain crepitus or joint limitation noted with ROM b/l and bunion deformity noted b/l.  Neurological: Protective sensation intact 5/5 intact bilaterally with 10g monofilament b/l. Vibratory sensation intact b/l. Babinski reflex negative b/l. Clonus negative b/l.  Assessment: 1. Pain due to onychomycosis of toenails of both feet   2. Onychogryphosis   3. Pain in toes of both feet      Plan: -Discussed topical, laser and oral medication. We will not entertain oral antifungal due to age and possible side effects. Daughter would like to defer treatment for now due to extent of nail fungus. I informed her topical antifungals most likely will not work due to extent of onychomycosis. Our goal is to keep him comfortable with routine toenail debridements and free of any secondary bacterial infection.  She related understanding.  -Toenails 1-5 b/l were debrided in length and girth with sterile nail nippers and dremel without iatrogenic bleeding.  -He is on palliative care services. -Patient to continue soft, supportive shoe gear daily. -Patient to report any pedal injuries to medical professional immediately. -Patient/POA to call should there be question/concern in the interim.  Return in about 3 months (around 03/27/2020) for nail trim.

## 2020-01-25 DIAGNOSIS — I10 Essential (primary) hypertension: Secondary | ICD-10-CM | POA: Diagnosis not present

## 2020-01-25 DIAGNOSIS — Z8249 Family history of ischemic heart disease and other diseases of the circulatory system: Secondary | ICD-10-CM | POA: Diagnosis not present

## 2020-01-25 DIAGNOSIS — M810 Age-related osteoporosis without current pathological fracture: Secondary | ICD-10-CM | POA: Diagnosis not present

## 2020-01-25 DIAGNOSIS — Z7983 Long term (current) use of bisphosphonates: Secondary | ICD-10-CM | POA: Diagnosis not present

## 2020-01-25 DIAGNOSIS — Z8546 Personal history of malignant neoplasm of prostate: Secondary | ICD-10-CM | POA: Diagnosis not present

## 2020-01-25 DIAGNOSIS — E039 Hypothyroidism, unspecified: Secondary | ICD-10-CM | POA: Diagnosis not present

## 2020-01-25 DIAGNOSIS — G3184 Mild cognitive impairment, so stated: Secondary | ICD-10-CM | POA: Diagnosis not present

## 2020-01-25 DIAGNOSIS — R32 Unspecified urinary incontinence: Secondary | ICD-10-CM | POA: Diagnosis not present

## 2020-01-25 DIAGNOSIS — M1990 Unspecified osteoarthritis, unspecified site: Secondary | ICD-10-CM | POA: Diagnosis not present

## 2020-01-25 DIAGNOSIS — Z809 Family history of malignant neoplasm, unspecified: Secondary | ICD-10-CM | POA: Diagnosis not present

## 2020-02-13 ENCOUNTER — Other Ambulatory Visit: Payer: Self-pay | Admitting: Family Medicine

## 2020-03-27 ENCOUNTER — Other Ambulatory Visit: Payer: Self-pay

## 2020-03-27 ENCOUNTER — Encounter: Payer: Self-pay | Admitting: Podiatry

## 2020-03-27 ENCOUNTER — Ambulatory Visit: Payer: Medicare HMO | Admitting: Podiatry

## 2020-03-27 DIAGNOSIS — M79674 Pain in right toe(s): Secondary | ICD-10-CM

## 2020-03-27 DIAGNOSIS — B351 Tinea unguium: Secondary | ICD-10-CM

## 2020-03-27 DIAGNOSIS — M79675 Pain in left toe(s): Secondary | ICD-10-CM

## 2020-03-27 NOTE — Progress Notes (Signed)
Subjective: [name] presents today for follow up of painful, elongated mycotic toenails. His daughter is present during today's visit. Daughter is concerned because Dad wants to purchase sharp instrument to cut his toenails. Mr. Amend will be celebrating his 86th birthday this Friday.   Past Medical History:  Diagnosis Date  . Cancer [facility])    prostate  . Carotid artery stenosis   . Degenerative disc disease   . Fractures    compression  . History of prostate cancer   . Hyperlipidemia   . Hypertension   . Kyphosis   . Non-cardiac chest pain 04/11   hospital ? [facility]     Patient Active Problem List   Diagnosis Date Noted  . Goals of care, counseling/discussion   . Palliative care by specialist   . Lower extremity weakness 06/14/2019  . Weakness 06/14/2019  . Hearing loss 09/14/2018  . Thickened nails 09/14/2018  . PVD (peripheral vascular disease) ([place]) 11/05/2017  . Fall at home 09/11/2017  . Poor balance 09/11/2017  . Vitamin D deficiency 08/28/2016  . Routine general medical examination at a health care facility 08/24/2015  . B12 deficiency 08/14/2014  . Low back pain 05/02/2014  . Urinary frequency 05/02/2014  . Encounter for Medicare annual wellness exam 07/27/2013  . Motion sickness 04/04/2013  . Osteopenia 06/14/2012  . Hypothyroid 06/14/2012  . 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 FECAL OCCULT BLOOD 02/01/2010  . ANEMIA, MILD 01/23/2010  . Essential hypertension 02/23/2009  . Carotid artery stenosis 02/23/2009  . SPONDYLOSIS, LUMBAR 02/23/2009  . NECK PAIN, CHRONIC 02/23/2009  . BACK PAIN, CHRONIC 02/23/2009  . H/O compression fracture of spine 02/23/2009  . HYPERCHOLESTEROLEMIA 02/21/2009  . History of prostate cancer 02/21/2009     Past Surgical History:  Procedure Laterality Date  . CARDIOVASCULAR STRESS TEST  2004   cardiolite negative  . carotid doppler  11/05   0-39% bilatteral  . herniated disc surgery  1990   L3-4  . [place]   right   . prostate cancer surgery    . PROSTATE SURGERY    . TONSILLECTOMY       Current Outpatient Medications on File Prior to Visit  Medication Sig Dispense Refill  . alendronate (FOSAMAX) 70 MG tablet Take 1 tablet (70 mg total) by mouth every 7 (seven) days. Take with a full glass of water on an empty stomach. 4 tablet 11  . Alpha-D-Galactosidase (BEANO) TABS Take 1 tablet by mouth as needed (before meals, to improve digestion).    Marland Kitchen amLODipine (NORVASC) 5 MG tablet Take 1 tablet (5 mg total) by mouth daily. 30 tablet 11  . aspirin EC 81 MG EC tablet Take 81 mg by mouth 3 (three) times a week.     . Cholecalciferol (VITAMIN D-3) 25 MCG (1000 UT) CAPS Take 1,000-2,000 Units by mouth daily.    Marland Kitchen guaiFENesin (MUCINEX) 600 MG 12 hr tablet Take 600 mg by mouth 2 (two) times daily as needed for to loosen phlegm.     Marland Kitchen levothyroxine (SYNTHROID) 50 MCG tablet Take 1 tablet (50 mcg total) by mouth daily. 30 tablet 11  . oxybutynin (DITROPAN-XL) 5 MG 24 hr tablet TAKE 1 TABLET BY MOUTH EVERYDAY AT BEDTIME 90 tablet 1  . SELENIUM PO Take 1 tablet by mouth daily with breakfast.     . vitamin B-12 (CYANOCOBALAMIN) 100 MCG tablet Take 1 tablet (100 mcg total) by mouth every other day. 1 tablet 0   No current facility-administered  medications on file prior to visit.     Allergies  Allergen Reactions  . Simvastatin Other (See Comments)    Aches and pains     Social History   Occupational History  . 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 Smoking status: Never Smoker  . Smokeless tobacco: Never Used  Vaping Use  . Vaping Use: Never used  Substance and Sexual Activity  . Alcohol use: Yes    Alcohol/week: 6.0 standard drinks    Types: 6 Glasses of wine per week  . Drug use: No  . Sexual activity: Never     Family History  Problem Relation Age of Onset  . Hypertension Father      Immunization History  Administered Date(s)  Administered  . Fluad Quad(high Dose 65+) 06/17/2019  . Influenza Split 06/05/2011  . Influenza Whole 05/18/2012  . Influenza, High Dose Seasonal PF 06/13/2017, 06/03/2018  . Influenza,inj,Quad PF,6+ Mos 06/03/2016  . Influenza-Unspecified 05/25/2013, 05/30/2014  . PFIZER SARS-COV-2 Vaccination 10/06/2019  . Pneumococcal Conjugate-13 08/14/2014  . Pneumococcal Polysaccharide-23 11/08/2002  . Td 09/08/2000, 06/14/2012  . Tdap 12/31/2016  . Zoster 07/08/2005     Objective: There were no vitals filed for this visit.  Pt is a pleasant 86 y.o.Caucasian male, WD, WN in NAD.  AAO x 3.   Vascular Examination:  Capillary fill time to digits <3 seconds b/l. Faintly palpable DP pulses b/l. Nonpalpable PT pulses b/l. Pedal hair absent b/l Skin temperature gradient within normal limits b/l. Skin temperature gradient warm to cool b/l. Dependent rubor noted b/l. Trace edema noted b/l feet.  Dermatological Examination: Pedal skin is thin shiny, atrophic bilaterally. No open wounds bilaterally. No interdigital macerations bilaterally. Toenails 1-5 b/l elongated, dystrophic, thickened, crumbly with subungual debris and tenderness to dorsal palpation.   Musculoskeletal: Normal muscle strength 5/5 to all lower extremity muscle groups bilaterally, no pain crepitus or joint limitation noted with ROM b/l and bunion deformity noted b/l.  Neurological: Protective sensation intact 5/5 intact bilaterally with 10g monofilament b/l. Vibratory sensation intact b/l. Babinski reflex negative b/l. Clonus negative b/l.  Assessment: 1. Pain due to onychomycosis of toenails of both feet     Plan: -Discouraged Mr. Buntyn from using any sharp instrumentation on his feet due to potential for injury and aspirin use. He related understanding.  -Toenails 1-5 b/l were debrided in length and girth with sterile nail nippers and dremel without iatrogenic bleeding.  -He is on palliative care services. -Patient to continue  soft, supportive shoe gear daily. -Patient to report any pedal injuries to medical professional immediately. -Patient/POA to call should there be question/concern in the interim.  Return in about 3 months (around 06/27/2020) for nail trim.

## 2020-03-27 NOTE — Patient Instructions (Signed)
Mr. Mcisaac! Have a happy, joyous 89th birthday!  Please do not use any sharp instruments on your feet.

## 2020-04-13 ENCOUNTER — Telehealth: Payer: Self-pay | Admitting: Family Medicine

## 2020-04-13 MED ORDER — LEVOTHYROXINE SODIUM 50 MCG PO TABS: 50.0000 ug | ORAL_TABLET | Freq: Every day | ORAL | 0 refills | Status: DC

## 2020-04-13 NOTE — Telephone Encounter (Signed)
See CPE from 09/22/19, PCP states she was increasing dose and pt was to have a recheck in 4-6 weeks, pt never came back for recheck, do you want to fill med or have him come in for his labs, please advise

## 2020-04-13 NOTE — Telephone Encounter (Signed)
Please fill once and schedule labs to do within that time frame thanks

## 2020-04-13 NOTE — Telephone Encounter (Signed)
Med refilled once, [facility] please call pt to get non-fasting lab appt within a month

## 2020-04-13 NOTE — Telephone Encounter (Signed)
[name] patients Nurse called stating he needing a refill for thyroid medication. Please give them a call at [phone] once prescription is refilled.

## 2020-04-16 ENCOUNTER — Other Ambulatory Visit: Payer: Self-pay | Admitting: Family Medicine

## 2020-04-16 NOTE — Telephone Encounter (Signed)
I spoke to patient's wife and she said she didn't have her calendar.  She said she'll call back to schedule a non-fasting lab.

## 2020-05-02 ENCOUNTER — Other Ambulatory Visit: Payer: Medicare HMO | Admitting: Hospice

## 2020-05-02 ENCOUNTER — Other Ambulatory Visit: Payer: Self-pay

## 2020-05-02 DIAGNOSIS — Z515 Encounter for palliative care: Secondary | ICD-10-CM | POA: Diagnosis not present

## 2020-05-02 NOTE — [facility] (Signed)
[name] Consult Note Telephone: [phone]  Fax: [phone]  PATIENT NAME: [name] DOB: Apr 27, 1920 MRN: [id number]  PRIMARY CARE PROVIDER:   Abner Greenspan, MD  REFERRING PROVIDER:  Abner Greenspan, MD [place],  [place] [address]  [place] contact [name] 336 [phone] [name] [place] STATEMENT Due to the COVID-19 crisis, this visit was done via telephone from my office. It was initiated and consented to by this patient and/or family.  RECOMMENDATIONS/PLAN: Care Planning/Goals of Care: Telehealth for ongoingbuilding oftrust and follow up on palliative care.  Code Status: Patient is a Partial code. Attempt resuscitation. No intubation/mechanical ventilation Goals of care: Patient's MOST selections in Epicinclude to attempt resuscitation, limited additional intervention, antibiotics and IV fluids as indicated and feeding tube for a trial period. Form uploaded in [place].Goals of care includedto maximize quality of life and symptom management. Follow AY:[id number] care will continue to follow patient for goals of care clarification and symptom management. Caregiver said patient not ready for in person visit at this time; NP will follow up in 6 weeks via telehealth. Called [name] twice and left a her a voicemail with call back number.  Symptoms Management:Caregiver reports patient stable and doing well; no medical acuity. No report of pain/discomfort, no coughing or shortness of breath. No report of hospitalization. Patient continues to ambulate around the house sometimes with a walker or cane. Ongoing educationon the need for walker/cane for support to prevent a fall; also stressed on the need forslow position changes. Encouraged ongoing supportive care.He is compliant with his medications; no complaints today.  I spent40minutes providing  this consultation; time includes chart review and documentation.More than 50% of the time in this consultation was spent coordinating communication. HISTORY OF PRESENT ILLNESS:Gregory P Clappis a 86 y.o.year oldmalewith multiple medical problems including HTN, Hypothyroidism, Hx of Prostate CA in remission, fracture of T9. Palliative Care was asked to help address goals of care.  CODE STATUS: Partial code  PPS: 40% HOSPICE ELIGIBILITY/DIAGNOSIS: TBD  PAST MEDICAL HISTORY:  Past Medical History:  Diagnosis Date  . Cancer [facility])    prostate  . Carotid artery stenosis   . Degenerative disc disease   . Fractures    compression  . History of prostate cancer   . Hyperlipidemia   . Hypertension   . Kyphosis   . Non-cardiac chest pain 04/11   hospital ? [facility]    SOCIAL HX:  Social History   Tobacco Use  . Smoking status: Never Smoker  . Smokeless tobacco: Never Used  Substance Use Topics  . Alcohol use: Yes    Alcohol/week: 6.0 standard drinks    Types: 6 Glasses of wine per week    ALLERGIES:  Allergies  Allergen Reactions  . Simvastatin Other (See Comments)    Aches and pains     PERTINENT MEDICATIONS:  Outpatient Encounter Medications as of 05/02/2020  Medication Sig  . alendronate (FOSAMAX) 70 MG tablet Take 1 tablet (70 mg total) by mouth every 7 (seven) days. Take with a full glass of water on an empty stomach.  . Alpha-D-Galactosidase (BEANO) TABS Take 1 tablet by mouth as needed (before meals, to improve digestion).  Marland Kitchen amLODipine (NORVASC) 5 MG tablet Take 1 tablet (5 mg total) by mouth daily.  Marland Kitchen aspirin EC 81 MG EC tablet Take 81 mg by mouth 3 (three) times a week.   . Cholecalciferol (VITAMIN  D-3) 25 MCG (1000 UT) CAPS Take 1,000-2,000 Units by mouth daily.  Marland Kitchen guaiFENesin (MUCINEX) 600 MG 12 hr tablet Take 600 mg by mouth 2 (two) times daily as needed for to loosen phlegm.   Marland Kitchen levothyroxine (SYNTHROID) 50 MCG tablet Take 1 tablet (50 mcg total) by mouth  daily. Please make an appointment for labs  . oxybutynin (DITROPAN-XL) 5 MG 24 hr tablet TAKE 1 TABLET BY MOUTH EVERYDAY AT BEDTIME  . SELENIUM PO Take 1 tablet by mouth daily with breakfast.   . vitamin B-12 (CYANOCOBALAMIN) 100 MCG tablet Take 1 tablet (100 mcg total) by mouth every other day.   No facility-administered encounter medications on file as of 05/02/2020.    Teodoro Spray, NP

## 2020-05-15 ENCOUNTER — Encounter: Payer: Self-pay | Admitting: Family Medicine

## 2020-05-15 DIAGNOSIS — R531 Weakness: Secondary | ICD-10-CM

## 2020-05-15 DIAGNOSIS — G8929 Other chronic pain: Secondary | ICD-10-CM

## 2020-05-15 DIAGNOSIS — M545 Low back pain, unspecified: Secondary | ICD-10-CM

## 2020-05-15 NOTE — Telephone Encounter (Signed)
I put a px for the leg lifter mechanism in the IN box to fax wherever it is needed  Hopefully it will help

## 2020-05-16 ENCOUNTER — Telehealth: Payer: Self-pay | Admitting: Family Medicine

## 2020-05-16 DIAGNOSIS — E039 Hypothyroidism, unspecified: Secondary | ICD-10-CM

## 2020-05-16 NOTE — Telephone Encounter (Signed)
-----   Message from Cloyd Stagers, RT sent at 05/11/2020  1:15 PM EDT ----- Regarding: Lab Orders for Thursday 9.9.2021 Please place lab orders for Thursday 9.9.2021, appt notes state "thyroid" Thank you, Dyke Maes RT(R)

## 2020-05-17 ENCOUNTER — Other Ambulatory Visit: Payer: Medicare HMO

## 2020-05-18 ENCOUNTER — Other Ambulatory Visit: Payer: Medicare HMO

## 2020-05-24 ENCOUNTER — Other Ambulatory Visit (INDEPENDENT_AMBULATORY_CARE_PROVIDER_SITE_OTHER): Payer: Medicare HMO

## 2020-05-24 DIAGNOSIS — E039 Hypothyroidism, unspecified: Secondary | ICD-10-CM

## 2020-05-25 LAB — TSH: TSH: 4.39 u[IU]/mL (ref 0.35–4.50)

## 2020-06-07 DIAGNOSIS — R8271 Bacteriuria: Secondary | ICD-10-CM | POA: Diagnosis not present

## 2020-06-07 DIAGNOSIS — N35012 Post-traumatic membranous urethral stricture: Secondary | ICD-10-CM | POA: Diagnosis not present

## 2020-06-07 DIAGNOSIS — R9721 Rising PSA following treatment for malignant neoplasm of prostate: Secondary | ICD-10-CM | POA: Diagnosis not present

## 2020-06-07 DIAGNOSIS — Z8546 Personal history of malignant neoplasm of prostate: Secondary | ICD-10-CM | POA: Diagnosis not present

## 2020-06-07 DIAGNOSIS — N35811 Other urethral stricture, male, meatal: Secondary | ICD-10-CM | POA: Diagnosis not present

## 2020-06-07 DIAGNOSIS — R32 Unspecified urinary incontinence: Secondary | ICD-10-CM | POA: Diagnosis not present

## 2020-06-07 DIAGNOSIS — R351 Nocturia: Secondary | ICD-10-CM | POA: Diagnosis not present

## 2020-06-07 DIAGNOSIS — R69 Illness, unspecified: Secondary | ICD-10-CM | POA: Diagnosis not present

## 2020-06-13 ENCOUNTER — Other Ambulatory Visit: Payer: Self-pay | Admitting: Family Medicine

## 2020-06-21 ENCOUNTER — Telehealth: Payer: Self-pay

## 2020-06-21 NOTE — Telephone Encounter (Signed)
Volunteer check in call made for palliative care: Doing well 

## 2020-06-27 ENCOUNTER — Ambulatory Visit: Payer: Medicare HMO | Admitting: Podiatry

## 2020-07-04 DIAGNOSIS — Z8546 Personal history of malignant neoplasm of prostate: Secondary | ICD-10-CM | POA: Diagnosis not present

## 2020-07-09 DIAGNOSIS — R351 Nocturia: Secondary | ICD-10-CM | POA: Diagnosis not present

## 2020-07-26 DIAGNOSIS — R32 Unspecified urinary incontinence: Secondary | ICD-10-CM | POA: Diagnosis not present

## 2020-09-14 ENCOUNTER — Telehealth: Payer: Self-pay

## 2020-09-14 DIAGNOSIS — E039 Hypothyroidism, unspecified: Secondary | ICD-10-CM

## 2020-09-14 DIAGNOSIS — E538 Deficiency of other specified B group vitamins: Secondary | ICD-10-CM

## 2020-09-14 DIAGNOSIS — D649 Anemia, unspecified: Secondary | ICD-10-CM

## 2020-09-14 DIAGNOSIS — E559 Vitamin D deficiency, unspecified: Secondary | ICD-10-CM

## 2020-09-14 DIAGNOSIS — I10 Essential (primary) hypertension: Secondary | ICD-10-CM

## 2020-09-14 NOTE — Telephone Encounter (Signed)
Left v/m requesting cb.

## 2020-09-14 NOTE — Telephone Encounter (Signed)
I spoke with pt; pt went to [place] and they were filled up and pt could not be seen; pt wants to go to [facility] at [place] on 09[facility]15/20; [facility] [place] [facility] at 10 am on 09/15/20 and I called for pt and no available appts but if pt walks in he will be worked in and seen. Pt voiced understanding. Pt notified as instructed by Dr Alba Cory instructions and pt voiced understanding. FYI to Dr Glori Bickers.

## 2020-09-14 NOTE — Telephone Encounter (Signed)
Thanks -I will watch for correspondence 

## 2020-09-14 NOTE — Telephone Encounter (Signed)
Thanks for letting me know. I do believe his wife passed away so you were probably talking to a caregiver.  Sorry I am seeing people remotely today and that will not help a wound  Do inst to keep clean/ cover loosely with non stick bandage unless bleeding (then pressure)  Agree with UC adv if no one is available in the office Thanks

## 2020-09-14 NOTE — Telephone Encounter (Addendum)
Pt left v/m that he was waiting on cb because pt had injury to arm. I called pt back and no answer and no v/m and phone rang x 2 until cut off automatically. I tried calling wife also but again no answer and no v/m and ringing cut off automatically. Most recent DPR from 2021 pt does not give anyone else to speak with. I tried daughters numbers to get different contact # but no answer. Will try later and sending note to [facility] CMA as well.  I spoke with pt and his wife because pt could not hear me on phone. No available appts at [facility] or [place]. Advise pt and his wife could go to [facility] on [address] in [place] and be seen. Pt said he just wants someone to look at it and make sure it is OK. Pt grabbed a bar this morning and scraped his rt arm peeling the skin back 3" x 3".  pts wife voiced understanding and will take pt to [facility] [address]. Sending note to Dr Glori Bickers.

## 2020-09-17 NOTE — Addendum Note (Signed)
Addended by: Loura Pardon A on: 09/17/2020 10:43 AM   Modules accepted: Orders

## 2020-09-17 NOTE — Telephone Encounter (Signed)
Contacted pt because no note from UC in the chart. Pt reports he did not go to UC. He reports the injury is ok and his caregiver who is with him most of the time is changing the bandage and applying neosporin daily. Advised of signs and symptoms of infection. Pt denied any signs or symptoms. Pt also reported he slid out of bed this morning but that bed is low to ground and there were no injuries. He did need help getting up but here was help available.  Pt requested his annual exam be scheduled. Scheduled apts for wellness nurse, labs and AWV #2. Advised if anything is needed before apt to contact office. Pt verbalized understanding.

## 2020-09-17 NOTE — Telephone Encounter (Signed)
[place] Day - Client TELEPHONE ADVICE RECORD [facility] Patient Name: [name] Gender: Male DOB: Jan 23, 1920 Age: 87 Y 50 M 15 D Return Phone Number: [phone] (Primary) Address: City/State/Zip: [place]  [address] Client [facility] Day - Client Client Site [place] - Day Contact Type Call Who Is Calling Patient / Member / Family / Caregiver Call Type Triage / Clinical Relationship To Patient Self Return Phone Number [phone] (Primary) Chief Complaint Arm Injury Reason for Call Symptomatic / Request for [place] states that he fell this morning and scraped his arm. Translation No Disp. Time Eilene Ghazi Time) Disposition Final User 09/14/2020 12:01:11 PM Attempt made - line busy Hassell Done, RN, Melanie 09/14/2020 12:13:06 PM FINAL ATTEMPT MADE - no message left Arnaldo Natal 09/14/2020 12:13:13 PM Send to RN Final Attempt Milinda Hirschfeld, RN, Melanie 09/14/2020 4:22:03 PM FINAL ATTEMPT MADE - no message left

## 2020-09-19 ENCOUNTER — Other Ambulatory Visit: Payer: Self-pay

## 2020-09-19 ENCOUNTER — Ambulatory Visit (INDEPENDENT_AMBULATORY_CARE_PROVIDER_SITE_OTHER): Payer: Medicare HMO

## 2020-09-19 DIAGNOSIS — Z0000 Encounter for general adult medical examination without abnormal findings: Secondary | ICD-10-CM | POA: Diagnosis not present

## 2020-09-19 NOTE — Patient Instructions (Signed)
[name] , Thank you for taking time to come for your Medicare Wellness Visit. I appreciate your ongoing commitment to your health goals. Please review the following plan we discussed and let me know if I can assist you in the future.   Screening recommendations/referrals: Colonoscopy: no longer required  Recommended yearly ophthalmology/optometry visit for glaucoma screening and checkup Recommended yearly dental visit for hygiene and checkup  Vaccinations: Influenza vaccine: Up to date, completed 06/06/2020, due 04/2021 Pneumococcal vaccine: Completed series Tdap vaccine: Up to date, completed 12/31/2016, due 12/2026 Shingles vaccine: due, check with your insurance regarding coverage if interested    Covid-19: Completed series  Advanced directives: copy in chart   Conditions/risks identified: hypertension, hypercholesterolemia   Next appointment: Follow up in one year for your annual wellness visit.   Preventive Care 55 Years and Older, Male Preventive care refers to lifestyle choices and visits with your health care provider that can promote health and wellness. What does preventive care include?  A yearly physical exam. This is also called an annual well check.  Dental exams once or twice a year.  Routine eye exams. Ask your health care provider how often you should have your eyes checked.  Personal lifestyle choices, including:  Daily care of your teeth and gums.  Regular physical activity.  Eating a healthy diet.  Avoiding tobacco and drug use.  Limiting alcohol use.  Practicing safe sex.  Taking low doses of aspirin every day.  Taking vitamin and mineral supplements as recommended by your health care provider. What happens during an annual well check? The services and screenings done by your health care provider during your annual well check will depend on your age, overall health, lifestyle risk factors, and family history of disease. Counseling  Your health  care provider may ask you questions about your:  Alcohol use.  Tobacco use.  Drug use.  Emotional well-being.  Home and relationship well-being.  Sexual activity.  Eating habits.  History of falls.  Memory and ability to understand (cognition).  Work and work Statistician. Screening  You may have the following tests or measurements:  Height, weight, and BMI.  Blood pressure.  Lipid and cholesterol levels. These may be checked every 5 years, or more frequently if you are over 65 years old.  Skin check.  Lung cancer screening. You may have this screening every year starting at age 43 if you have a 30-pack-year history of smoking and currently smoke or have quit within the past 15 years.  Fecal occult blood test (FOBT) of the stool. You may have this test every year starting at age 8.  Flexible sigmoidoscopy or colonoscopy. You may have a sigmoidoscopy every 5 years or a colonoscopy every 10 years starting at age 55.  Prostate cancer screening. Recommendations will vary depending on your family history and other risks.  Hepatitis C blood test.  Hepatitis B blood test.  Sexually transmitted disease (STD) testing.  Diabetes screening. This is done by checking your blood sugar (glucose) after you have not eaten for a while (fasting). You may have this done every 1-3 years.  Abdominal aortic aneurysm (AAA) screening. You may need this if you are a current or former smoker.  Osteoporosis. You may be screened starting at age 36 if you are at high risk. Talk with your health care provider about your test results, treatment options, and if necessary, the need for more tests. Vaccines  Your health care provider may recommend certain vaccines, such as:  Influenza vaccine. This is recommended every year.  Tetanus, diphtheria, and acellular pertussis (Tdap, Td) vaccine. You may need a Td booster every 10 years.  Zoster vaccine. You may need this after age  7.  Pneumococcal 13-valent conjugate (PCV13) vaccine. One dose is recommended after age 54.  Pneumococcal polysaccharide (PPSV23) vaccine. One dose is recommended after age 41. Talk to your health care provider about which screenings and vaccines you need and how often you need them. This information is not intended to replace advice given to you by your health care provider. Make sure you discuss any questions you have with your health care provider. Document Released: 09/21/2015 Document Revised: 05/14/2016 Document Reviewed: 06/26/2015 Elsevier Interactive Patient Education  2017 [employer] Prevention in the Home Falls can cause injuries. They can happen to people of all ages. There are many things you can do to make your home safe and to help prevent falls. What can I do on the outside of my home?  Regularly fix the edges of walkways and driveways and fix any cracks.  Remove anything that might make you trip as you walk through a door, such as a raised step or threshold.  Trim any bushes or trees on the path to your home.  Use bright outdoor lighting.  Clear any walking paths of anything that might make someone trip, such as rocks or tools.  Regularly check to see if handrails are loose or broken. Make sure that both sides of any steps have handrails.  Any raised decks and porches should have guardrails on the edges.  Have any leaves, snow, or ice cleared regularly.  Use sand or salt on walking paths during winter.  Clean up any spills in your garage right away. This includes oil or grease spills. What can I do in the bathroom?  Use night lights.  Install grab bars by the toilet and in the tub and shower. Do not use towel bars as grab bars.  Use non-skid mats or decals in the tub or shower.  If you need to sit down in the shower, use a plastic, non-slip stool.  Keep the floor dry. Clean up any water that spills on the floor as soon as it happens.  Remove  soap buildup in the tub or shower regularly.  Attach bath mats securely with double-sided non-slip rug tape.  Do not have throw rugs and other things on the floor that can make you trip. What can I do in the bedroom?  Use night lights.  Make sure that you have a light by your bed that is easy to reach.  Do not use any sheets or blankets that are too big for your bed. They should not hang down onto the floor.  Have a firm chair that has side arms. You can use this for support while you get dressed.  Do not have throw rugs and other things on the floor that can make you trip. What can I do in the kitchen?  Clean up any spills right away.  Avoid walking on wet floors.  Keep items that you use a lot in easy-to-reach places.  If you need to reach something above you, use a strong step stool that has a grab bar.  Keep electrical cords out of the way.  Do not use floor polish or wax that makes floors slippery. If you must use wax, use non-skid floor wax.  Do not have throw rugs and other things on the floor that  can make you trip. What can I do with my stairs?  Do not leave any items on the stairs.  Make sure that there are handrails on both sides of the stairs and use them. Fix handrails that are broken or loose. Make sure that handrails are as long as the stairways.  Check any carpeting to make sure that it is firmly attached to the stairs. Fix any carpet that is loose or worn.  Avoid having throw rugs at the top or bottom of the stairs. If you do have throw rugs, attach them to the floor with carpet tape.  Make sure that you have a light switch at the top of the stairs and the bottom of the stairs. If you do not have them, ask someone to add them for you. What else can I do to help prevent falls?  Wear shoes that:  Do not have high heels.  Have rubber bottoms.  Are comfortable and fit you well.  Are closed at the toe. Do not wear sandals.  If you use a  stepladder:  Make sure that it is fully opened. Do not climb a closed stepladder.  Make sure that both sides of the stepladder are locked into place.  Ask someone to hold it for you, if possible.  Clearly mark and make sure that you can see:  Any grab bars or handrails.  First and last steps.  Where the edge of each step is.  Use tools that help you move around (mobility aids) if they are needed. These include:  Canes.  Walkers.  Scooters.  Crutches.  Turn on the lights when you go into a dark area. Replace any light bulbs as soon as they burn out.  Set up your furniture so you have a clear path. Avoid moving your furniture around.  If any of your floors are uneven, fix them.  If there are any pets around you, be aware of where they are.  Review your medicines with your doctor. Some medicines can make you feel dizzy. This can increase your chance of falling. Ask your doctor what other things that you can do to help prevent falls. This information is not intended to replace advice given to you by your health care provider. Make sure you discuss any questions you have with your health care provider. Document Released: 06/21/2009 Document Revised: 01/31/2016 Document Reviewed: 09/29/2014 Elsevier Interactive Patient Education  2017 [employer].

## 2020-09-19 NOTE — Progress Notes (Signed)
Subjective:   [name] is a 87 y.o. male who presents for Medicare Annual/Subsequent preventive examination.  Review of Systems: N/A      I connected with the patient today by telephone and verified that I am speaking with the correct person using two identifiers. Location patient: home Location nurse: work Persons participating in the telephone visit: patient, nurse.   I discussed the limitations, risks, security and privacy concerns of performing an evaluation and management service by telephone and the availability of in person appointments. I also discussed with the patient that there may be a patient responsible charge related to this service. The patient expressed understanding and verbally consented to this telephonic visit.        Cardiac Risk Factors include: advanced age (>42men, >80 women);male gender;hypertension;Other (see comment), Risk factor comments: hypercholesterolemia     Objective:    Today's Vitals   09/19/20 1205  PainSc: 5    There is no height or weight on file to calculate BMI.  Advanced Directives 09/19/2020 09/15/2019 06/15/2019 09/06/2018 08/29/2016  Does Patient Have a Medical Advance Directive? Yes Yes Yes Yes Yes  Type of Paramedic of [place];Living will [place];Living will Healthcare Power of [place];Living will [place];Living will  Does patient want to make changes to medical advance directive? - - No - Patient declined - -  Copy of [place] in Chart? Yes - validated most recent copy scanned in chart (See row information) Yes - validated most recent copy scanned in chart (See row information) - No - copy requested No - copy requested    Current Medications (verified) Outpatient Encounter Medications as of 09/19/2020  Medication Sig  . alendronate (FOSAMAX) 70 MG tablet Take 1 tablet (70 mg total) by mouth every 7 (seven) days.  Take with a full glass of water on an empty stomach.  . Alpha-D-Galactosidase (BEANO) TABS Take 1 tablet by mouth as needed (before meals, to improve digestion).  Marland Kitchen amLODipine (NORVASC) 5 MG tablet Take 1 tablet (5 mg total) by mouth daily.  Marland Kitchen aspirin EC 81 MG EC tablet Take 81 mg by mouth 3 (three) times a week.  . Cholecalciferol (VITAMIN D-3) 25 MCG (1000 UT) CAPS Take 1,000-2,000 Units by mouth daily.  Marland Kitchen guaiFENesin (MUCINEX) 600 MG 12 hr tablet Take 600 mg by mouth 2 (two) times daily as needed for to loosen phlegm.   Marland Kitchen levothyroxine (SYNTHROID) 50 MCG tablet Take 1 tablet (50 mcg total) by mouth daily. Please make an appointment for labs  . vitamin B-12 (CYANOCOBALAMIN) 100 MCG tablet Take 1 tablet (100 mcg total) by mouth every other day.  . oxybutynin (DITROPAN-XL) 5 MG 24 hr tablet TAKE 1 TABLET BY MOUTH EVERYDAY AT BEDTIME  . SELENIUM PO Take 1 tablet by mouth daily with breakfast.    No facility-administered encounter medications on file as of 09/19/2020.    Allergies (verified) Simvastatin   History: Past Medical History:  Diagnosis Date  . Cancer [facility])    prostate  . Carotid artery stenosis   . Degenerative disc disease   . Fractures    compression  . History of prostate cancer   . Hyperlipidemia   . Hypertension   . Kyphosis   . Non-cardiac chest pain 04/11   hospital ? [facility]   Past Surgical History:  Procedure Laterality Date  . CARDIOVASCULAR STRESS TEST  2004   cardiolite negative  . carotid  doppler  11/05   0-39% bilatteral  . herniated disc surgery  1990   L3-4  . Win[place]red   right  . prostate cancer surgery    . PROSTATE SURGERY    . TONSILLECTOMY     Family History  Problem Relation Age of Onset  . Hypertension Father    Social History   Socioeconomic History  . Marital status: Widowed    Spouse name: Not on file  . Number of children: Not on file  . Years of education: Not on file  . Highest education level: Not on file   Occupational History  . 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 Smoking status: Never Smoker  . Smokeless tobacco: Never Used  Vaping Use  . Vaping Use: Never used  Substance and Sexual Activity  . Alcohol use: Yes    Alcohol/week: 6.0 standard drinks    Types: 6 Glasses of wine per week  . Drug use: No  . Sexual activity: Never  Other Topics Concern  . 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 Difficulty of Paying Living Expenses: Not hard at all  Food Insecurity: No Food Insecurity  . Worried About Charity fundraiser in the Last Year: Never true  . Ran Out of Food in the Last Year: Never true  Transportation Needs: No Transportation Needs  . Lack of Transportation (Medical): No  . Lack of Transportation (Non-Medical): No  Physical Activity: Inactive  . Days of Exercise per Week: 0 days  . Minutes of Exercise per Session: 0 min  Stress: No Stress Concern Present  . Feeling of Stress : Not at all  Social Connections: Not on file    Tobacco Counseling Counseling given: Not Answered   Clinical Intake:  Pre-visit preparation completed: Yes  Pain : 0-10 Pain Score: 5  Pain Type: Acute pain Pain Location: Neck Pain Descriptors / Indicators: Aching Pain Onset: In the past 7 days Pain Frequency: Intermittent Pain Relieving Factors: tylenol  Pain Relieving Factors: tylenol  Nutritional Risks: None Diabetes: No  How often do you need to have someone help you when you read instructions, pamphlets, or other written materials from your doctor or pharmacy?: 1 - Never What is the last grade level you completed in school?: post graduate  Diabetic: No Nutrition Risk Assessment:  Has the patient had any N/V/D within the last 2 months?  No  Does the patient have any non-healing  wounds?  No  Has the patient had any unintentional weight loss or weight gain?  No   Diabetes:  Is the patient diabetic?  No  If diabetic, was a CBG obtained today?  N/A Did the patient bring in their glucometer from home?  N/A How often do you monitor your CBG's? N/A.   Financial Strains and Diabetes Management:  Are you having any financial strains with the device, your supplies or your medication? N/A.  Does the patient want to be seen by Chronic Care Management for management of their diabetes?  N/A Would the patient like to be referred to a Nutritionist or for Diabetic Management?  N/A    Interpreter Needed?: No  Information entered by :: CJohnson, LPN   Activities of Daily Living In your  present state of health, do you have any difficulty performing the following activities: 09/19/2020  Hearing? Y  Comment some hearing loss noted  Vision? N  Difficulty concentrating or making decisions? N  Walking or climbing stairs? N  Dressing or bathing? N  Doing errands, shopping? N  Preparing Food and eating ? N  Using the Toilet? N  In the past six months, have you accidently leaked urine? Y  Comment wears depends  Do you have problems with loss of bowel control? N  Managing your Medications? N  Managing your Finances? N  Housekeeping or managing your Housekeeping? N  Some recent data might be hidden    Patient Care Team: Tower, Wynelle Fanny, MD as PCP - General Dingeldein, Remo Lipps, MD as Consulting Physician (Ophthalmology) Almedia Balls, MD as Consulting Physician (Orthopedic Surgery) Aldona Lento, DDS as Consulting Physician (Dentistry)  Indicate any recent Medical Services you may have received from other than [facility] providers in the past year (date may be approximate).     Assessment:   This is a routine wellness examination for [name].  Hearing/Vision screen  Hearing Screening   125Hz  250Hz  500Hz  1000Hz  2000Hz  3000Hz  4000Hz  6000Hz  8000Hz   Right ear:           Left  ear:           Vision Screening Comments: Advised patient gets annual eye exams   Dietary issues and exercise activities discussed: Current Exercise Habits: The patient does not participate in regular exercise at present, Exercise limited by: None identified  Goals    . Patient Stated     Starting 09/06/2018, I will continue to take medications as prescribed.     . Patient Stated     09/15/2019, I will maintain and continue medications as prescribed.     . Patient Stated     09/19/2020, I will maintain and continue medications as prescribed.      Depression Screen PHQ 2/9 Scores 09/19/2020 09/15/2019 09/06/2018 09/11/2017 08/29/2016 08/24/2015 08/14/2014  PHQ - 2 Score 4 0 0 1 0 0 0  PHQ- 9 Score 4 0 0 - - - -    Fall Risk Fall Risk  09/19/2020 09/15/2019 09/06/2018 09/11/2017 08/29/2016  Falls in the past year? 1 1 1  Yes Yes  Number falls in past yr: 1 1 1 2  or more 2 or more  Injury with Fall? 0 0 1 No No  Risk Factor Category  - - - High Fall Risk High Fall Risk  Risk for fall due to : Impaired balance/gait;History of fall(s) Medication side effect Impaired balance/gait;Impaired mobility;Impaired vision;History of fall(s) - -  Risk for fall due to: Comment - - - - -  Follow up Falls evaluation completed;Falls prevention discussed Falls evaluation completed;Falls prevention discussed - - Falls prevention discussed    FALL RISK PREVENTION PERTAINING TO THE HOME:  Any stairs in or around the home? Yes  If so, are there any without handrails? No  Home free of loose throw rugs in walkways, pet beds, electrical cords, etc? Yes  Adequate lighting in your home to reduce risk of falls? Yes   ASSISTIVE DEVICES UTILIZED TO PREVENT FALLS:  Life alert? No  Use of a cane, walker or w/c? Yes  Grab bars in the bathroom? Yes   Shower chair or bench in shower? No  Elevated toilet seat or a handicapped toilet? No   TIMED UP AND GO:  Was the test performed? N/A telephone visit.    Cognitive  Function: MMSE - Mini Mental State Exam 09/19/2020 09/15/2019 09/06/2018 08/29/2016  Orientation to time 5 5 5 5   Orientation to Place 5 5 5 5   Registration 3 3 3 3   Attention/ Calculation 5 5 0 0  Recall 3 3 3 3   Language- name 2 objects - - 0 0  Language- repeat 1 1 1 1   Language- follow 3 step command - - 3 3  Language- read & follow direction - - 0 0  Write a sentence - - 0 0  Copy design - - 0 0  Total score - - 20 20  Mini Cog  Mini-Cog screen was completed. Maximum score is 22. A value of 0 denotes this part of the MMSE was not completed or the patient failed this part of the Mini-Cog screening.       Immunizations Immunization History  Administered Date(s) Administered  . Fluad Quad(high Dose 65+) 06/17/2019  . Influenza Split 06/05/2011  . Influenza Whole 05/18/2012  . Influenza, High Dose Seasonal PF 06/13/2017, 06/03/2018  . Influenza,inj,Quad PF,6+ Mos 06/03/2016  . Influenza-Unspecified 05/25/2013, 05/30/2014, 06/06/2020  . PFIZER SARS-COV-2 Vaccination 10/06/2019, 10/27/2019, 06/06/2020  . Pneumococcal Conjugate-13 08/14/2014  . Pneumococcal Polysaccharide-23 11/08/2002  . Td 09/08/2000, 06/14/2012  . Tdap 12/31/2016  . Zoster 07/08/2005    TDAP status: Up to date  Flu Vaccine status: Up to date  Pneumococcal vaccine status: Up to date  Covid-19 vaccine status: Completed vaccines  Qualifies for Shingles Vaccine? Yes   Zostavax completed Yes   Shingrix Completed?: No.    Education has been provided regarding the importance of this vaccine. Patient has been advised to call insurance company to determine out of pocket expense if they have not yet received this vaccine. Advised may also receive vaccine at local pharmacy or Health Dept. Verbalized acceptance and understanding.  Screening Tests Health Maintenance  Topic Date Due  . TETANUS/TDAP  01/01/2027  . INFLUENZA VACCINE  Completed  . COVID-19 Vaccine  Completed  . PNA vac Low Risk Adult  Completed     Health Maintenance  There are no preventive care reminders to display for this patient.  Colorectal cancer screening: No longer required.   Lung Cancer Screening: (Low Dose CT Chest recommended if Age 63-80 years, 30 pack-year currently smoking OR have quit w/in 15years.) does not qualify.    Additional Screening:  Hepatitis C Screening: does not qualify; Completed N/A  Vision Screening: Recommended annual ophthalmology exams for early detection of glaucoma and other disorders of the eye. Is the patient up to date with their annual eye exam?  No  Who is the provider or what is the name of the office in which the patient attends annual eye exams? Does not see eye doctor on the regular If pt is not established with a provider, would they like to be referred to a provider to establish care? No .   Dental Screening: Recommended annual dental exams for proper oral hygiene  Community Resource Referral / Chronic Care Management: CRR required this visit?  No   CCM required this visit?  No      Plan:     I have personally reviewed and noted the following in the patient's chart:   . Medical and social history . Use of alcohol, tobacco or illicit drugs  . Current medications and supplements . Functional ability and status . Nutritional status . Physical activity . Advanced directives . List of other physicians . Hospitalizations, surgeries, and ER visits in  previous 12 months . Vitals . Screenings to include cognitive, depression, and falls . Referrals and appointments  In addition, I have reviewed and discussed with patient certain preventive protocols, quality metrics, and best practice recommendations. A written personalized care plan for preventive services as well as general preventive health recommendations were provided to patient.   Due to this being a telephonic visit, the after visit summary with patients personalized plan was offered to patient via office or  my-chart.  Patient preferred to pick up at office at next visit or via mychart.   Andrez Grime, LPN   [id number]

## 2020-09-19 NOTE — Progress Notes (Signed)
PCP notes:  Health Maintenance: No gaps noted   Abnormal Screenings: none   Patient concerns: Neck pain Check for ear wax Discuss medication for depression    Nurse concerns: none   Next PCP appt.: 09/26/2020 @ 3:30 pm

## 2020-09-21 ENCOUNTER — Other Ambulatory Visit: Payer: Self-pay

## 2020-09-21 ENCOUNTER — Other Ambulatory Visit (INDEPENDENT_AMBULATORY_CARE_PROVIDER_SITE_OTHER): Payer: Medicare HMO

## 2020-09-21 DIAGNOSIS — I10 Essential (primary) hypertension: Secondary | ICD-10-CM

## 2020-09-21 DIAGNOSIS — E538 Deficiency of other specified B group vitamins: Secondary | ICD-10-CM

## 2020-09-21 DIAGNOSIS — E039 Hypothyroidism, unspecified: Secondary | ICD-10-CM

## 2020-09-21 DIAGNOSIS — E559 Vitamin D deficiency, unspecified: Secondary | ICD-10-CM | POA: Diagnosis not present

## 2020-09-21 DIAGNOSIS — D649 Anemia, unspecified: Secondary | ICD-10-CM | POA: Diagnosis not present

## 2020-09-21 LAB — CBC WITH DIFFERENTIAL/PLATELET
Basophils Absolute: 0 10*3/uL (ref 0.0–0.1)
Basophils Relative: 0.4 % (ref 0.0–3.0)
Eosinophils Absolute: 0.1 10*3/uL (ref 0.0–0.7)
Eosinophils Relative: 1.6 % (ref 0.0–5.0)
HCT: 33.8 % — ABNORMAL LOW (ref 39.0–52.0)
Hemoglobin: 11.4 g/dL — ABNORMAL LOW (ref 13.0–17.0)
Lymphocytes Relative: 13.6 % (ref 12.0–46.0)
Lymphs Abs: 1 10*3/uL (ref 0.7–4.0)
MCHC: 33.8 g/dL (ref 30.0–36.0)
MCV: 93.9 fl (ref 78.0–100.0)
Monocytes Absolute: 1 10*3/uL (ref 0.1–1.0)
Monocytes Relative: 13.7 % — ABNORMAL HIGH (ref 3.0–12.0)
Neutro Abs: 5.1 10*3/uL (ref 1.4–7.7)
Neutrophils Relative %: 70.7 % (ref 43.0–77.0)
Platelets: 452 10*3/uL — ABNORMAL HIGH (ref 150.0–400.0)
RBC: 3.6 Mil/uL — ABNORMAL LOW (ref 4.22–5.81)
RDW: 13.4 % (ref 11.5–15.5)
WBC: 7.2 10*3/uL (ref 4.0–10.5)

## 2020-09-21 LAB — COMPREHENSIVE METABOLIC PANEL
ALT: 22 U/L (ref 0–53)
AST: 24 U/L (ref 0–37)
Albumin: 3.7 g/dL (ref 3.5–5.2)
Alkaline Phosphatase: 70 U/L (ref 39–117)
BUN: 34 mg/dL — ABNORMAL HIGH (ref 6–23)
CO2: 27 mEq/L (ref 19–32)
Calcium: 9.3 mg/dL (ref 8.4–10.5)
Chloride: 102 mEq/L (ref 96–112)
Creatinine, Ser: 1.13 mg/dL (ref 0.40–1.50)
GFR: 54.11 mL/min — ABNORMAL LOW (ref >60.00)
Glucose, Bld: 89 mg/dL (ref 70–99)
Potassium: 5 mEq/L (ref 3.5–5.1)
Sodium: 137 mEq/L (ref 135–145)
Total Bilirubin: 0.4 mg/dL (ref 0.2–1.2)
Total Protein: 6.4 g/dL (ref 6.0–8.3)

## 2020-09-21 LAB — LIPID PANEL
Cholesterol: 151 mg/dL (ref 0–200)
HDL: 49.5 mg/dL (ref >39.00)
LDL Cholesterol: 88 mg/dL (ref 0–99)
NonHDL: 101.19
Total CHOL/HDL Ratio: 3
Triglycerides: 65 mg/dL (ref 0.0–149.0)
VLDL: 13 mg/dL (ref 0.0–40.0)

## 2020-09-21 LAB — IRON: Iron: 17 ug/dL — ABNORMAL LOW (ref 42–165)

## 2020-09-21 LAB — VITAMIN B12: Vitamin B-12: 490 pg/mL (ref 211–911)

## 2020-09-21 LAB — TSH: TSH: 5.76 u[IU]/mL — ABNORMAL HIGH (ref 0.35–4.50)

## 2020-09-21 LAB — VITAMIN D 25 HYDROXY (VIT D DEFICIENCY, FRACTURES): VITD: 34.69 ng/mL (ref 30.00–100.00)

## 2020-09-26 ENCOUNTER — Encounter: Payer: Medicare HMO | Admitting: Family Medicine

## 2020-10-03 ENCOUNTER — Encounter: Payer: Self-pay | Admitting: Family Medicine

## 2020-10-03 ENCOUNTER — Ambulatory Visit (INDEPENDENT_AMBULATORY_CARE_PROVIDER_SITE_OTHER): Payer: Medicare HMO | Admitting: Family Medicine

## 2020-10-03 ENCOUNTER — Other Ambulatory Visit: Payer: Self-pay

## 2020-10-03 VITALS — BP 140/60 | HR 67 | Temp 97.8°F | Ht 63.0 in | Wt 153.0 lb

## 2020-10-03 DIAGNOSIS — H6120 Impacted cerumen, unspecified ear: Secondary | ICD-10-CM | POA: Insufficient documentation

## 2020-10-03 DIAGNOSIS — F0390 Unspecified dementia without behavioral disturbance: Secondary | ICD-10-CM | POA: Insufficient documentation

## 2020-10-03 DIAGNOSIS — I739 Peripheral vascular disease, unspecified: Secondary | ICD-10-CM

## 2020-10-03 DIAGNOSIS — H6123 Impacted cerumen, bilateral: Secondary | ICD-10-CM

## 2020-10-03 DIAGNOSIS — Z0000 Encounter for general adult medical examination without abnormal findings: Secondary | ICD-10-CM | POA: Diagnosis not present

## 2020-10-03 DIAGNOSIS — D649 Anemia, unspecified: Secondary | ICD-10-CM

## 2020-10-03 DIAGNOSIS — M8588 Other specified disorders of bone density and structure, other site: Secondary | ICD-10-CM

## 2020-10-03 DIAGNOSIS — Z515 Encounter for palliative care: Secondary | ICD-10-CM | POA: Diagnosis not present

## 2020-10-03 DIAGNOSIS — I10 Essential (primary) hypertension: Secondary | ICD-10-CM | POA: Diagnosis not present

## 2020-10-03 DIAGNOSIS — R600 Localized edema: Secondary | ICD-10-CM

## 2020-10-03 DIAGNOSIS — E039 Hypothyroidism, unspecified: Secondary | ICD-10-CM

## 2020-10-03 DIAGNOSIS — G3184 Mild cognitive impairment, so stated: Secondary | ICD-10-CM

## 2020-10-03 DIAGNOSIS — E559 Vitamin D deficiency, unspecified: Secondary | ICD-10-CM | POA: Diagnosis not present

## 2020-10-03 DIAGNOSIS — E538 Deficiency of other specified B group vitamins: Secondary | ICD-10-CM

## 2020-10-03 DIAGNOSIS — H9193 Unspecified hearing loss, bilateral: Secondary | ICD-10-CM

## 2020-10-03 DIAGNOSIS — R2689 Other abnormalities of gait and mobility: Secondary | ICD-10-CM

## 2020-10-03 DIAGNOSIS — E7800 Pure hypercholesterolemia, unspecified: Secondary | ICD-10-CM | POA: Diagnosis not present

## 2020-10-03 MED ORDER — DONEPEZIL HCL 5 MG PO TABS: 5.0000 mg | ORAL_TABLET | Freq: Every day | ORAL | 1 refills | Status: AC

## 2020-10-03 NOTE — Assessment & Plan Note (Signed)
Plan to start debrox and consider ear irrigation here in a month or more

## 2020-10-03 NOTE — Assessment & Plan Note (Signed)
With low iron  Suggest niferex 150 mg one daily  I do not feel we need to look for cause at his age Continue to monitor

## 2020-10-03 NOTE — Assessment & Plan Note (Signed)
Reviewed health habits including diet and exercise and skin cancer prevention Reviewed appropriate screening tests for age  Also reviewed health mt list, fam hx and immunization status , as well as social and family history   See HPI No cancer screen at his age utd imms  Fall prev discussed  Memory and cognition disc- px aricept to consider with printed info  Disc goals for comfort/under palliative care also Adv directive is utd  Hearing screen reviewed-declined audiology/will try removing ear wax

## 2020-10-03 NOTE — Assessment & Plan Note (Signed)
May consider more PT once pandemic quiets more

## 2020-10-03 NOTE — Assessment & Plan Note (Signed)
With venous insufficiency Recommend knee high comp socks during the day  Also leg elevation when sitting

## 2020-10-03 NOTE — Assessment & Plan Note (Signed)
Taking fosamax and tolerates well  Multiple falls-disc fall prev in great detail  H/o comp fx (healed)  Taking vit D-inst to inc daily dose to 4000 iu

## 2020-10-03 NOTE — Assessment & Plan Note (Signed)
No ulceration or claudication

## 2020-10-03 NOTE — Patient Instructions (Addendum)
Let me know when you are ready for some physical therapy   Increase vitamin D over the counter to 4000 iu daily  This will help bones and pain and energy level   Get debrox - ear solution and use 1-2 times weekly   Take iron pill once daily for low iron   Try support socks to the knee during the day   Get a vaporizer for bedroom and main room  Cough the junk out  An incentive spirometer to exercise lungs and clear airway

## 2020-10-03 NOTE — Assessment & Plan Note (Signed)
Reviewed health habits including diet and exercise and skin cancer prevention Reviewed appropriate screening tests for age  Also reviewed health mt list, fam hx and immunization status , as well as social and family history   See HPI No cancer screen at his age utd imms  Fall prev discussed  Memory and cognition disc- px aricept to consider with printed info  Disc goals for comfort/under palliative care also Adv directive is utd  Hearing screen reviewed-declined audiology/will try removing ear wax 

## 2020-10-03 NOTE — Assessment & Plan Note (Signed)
Hypothyroidism  Pt has no clinical changes No change in energy level/ hair or skin/ edema and no tremor Lab Results  Component Value Date   TSH 5.76 (H) 09/21/2020    Has missed doses Getting pill box set up  Will continue to monitor

## 2020-10-03 NOTE — Progress Notes (Signed)
Subjective:    Patient ID: [name], male    DOB: 12-Aug-1920, 87 y.o.   MRN: [id number]  This visit occurred during the SARS-CoV-2 public health emergency.  Safety protocols were in place, including screening questions prior to the visit, additional usage of staff PPE, and extensive cleaning of exam room while observing appropriate contact time as indicated for disinfecting solutions.    HPI Pt presents for amw and health mt exam   I have personally reviewed the Medicare Annual Wellness questionnaire and have noted 1. The patient's medical and social history 2. Their use of alcohol, tobacco or illicit drugs 3. Their current medications and supplements 4. The patient's functional ability including ADL's, fall risks, home safety risks and hearing or visual             impairment. 5. Diet and physical activities 6. Evidence for depression or mood disorders  The patients weight, height, BMI have been recorded in the chart and visual acuity is per eye clinic.  I have made referrals, counseling and provided education to the patient based review of the above and I have provided the pt with a written personalized care plan for preventive services. Reviewed and updated provider list, see scanned forms.  See scanned forms.  Routine anticipatory guidance given to patient.  See health maintenance. Colon cancer screening-out aged Flu vaccine Tetanus vaccine 4/18 Pneumovax utd covid -had vaccines incl booster Zoster vaccine- zostavax 10/06 Prostate cancer screening-out aged/h/o cancer and sees urology  Bone density -nl dexa 2001 Falls- frequent  H/o TS comp fracture and kyphosis  Taking fosamax -no problems  Supplements - D level is 34 Falling / balance is an issue  Some skin tears   Has proofed house with bed rails and bars   Not a lot of exercise  Went through a course of physical therapy  Advance directive-utd  H/o prostate cancer with prostatectomy Takes medicine for  incontinence   Cognitive function addressed- see scanned forms- and if abnormal then additional documentation follows.   Has palliative care also   PMH and SH reviewed  Meds, vitals, and allergies reviewed.   ROS: See HPI.  Otherwise negative.    Care team Alesi Zachery-pcp Dingeldein-oph voytek- ortho  Weight  Wt Readings from Last 3 Encounters:  10/03/20 153 lb (69.4 kg)  09/22/19 148 lb 8 oz (67.4 kg)  06/29/19 148 lb 3 oz (67.2 kg)   27.10 kg/m  Wt is up 5 lb -appetite ok  Developed a sweet tooth   Hearing Screening   125Hz  250Hz  500Hz  1000Hz  2000Hz  3000Hz  4000Hz  6000Hz  8000Hz   Right ear:   0 40 0  0    Left ear:   20 20 20  20     known hearing loss  Not ready for hearing aides yet  Has ear wax  Vision -no changes   Hearing Screening   125Hz  250Hz  500Hz  1000Hz  2000Hz  3000Hz  4000Hz  6000Hz  8000Hz   Right ear:   0 40 0  0    Left ear:   20 20 20  20       Visual Acuity Screening   Right eye Left eye Both eyes  Without correction:     With correction: 20/50 20/30 20/25      HTN bp is stable today  No cp or palpitations or headaches or edema  No side effects to medicines  BP Readings from Last 3 Encounters:  10/03/20 140/60  09/22/19 124/60  06/29/19 128/65    Takes amlodipine 5  mg daily  Pulse Readings from Last 3 Encounters:  10/03/20 67  09/22/19 (!) 54  06/29/19 (!) 53   Takes asa 81 mg daily   Hypothyroidism  Pt has no clinical changes No change in energy level/ hair or skin/ edema and no tremor Lab Results  Component Value Date   TSH 5.76 (H) 09/21/2020    Takes levothyroxine 50 mcg daily  Misses doses frequently  Daughter plans to set up a pill box  Hyperlipidemia Lab Results  Component Value Date   CHOL 151 09/21/2020   CHOL 161 09/20/2019   CHOL 165 09/06/2018   Lab Results  Component Value Date   HDL 49.50 09/21/2020   HDL 36.10 (L) 09/20/2019   HDL 41.60 09/06/2018   Lab Results  Component Value Date   LDLCALC 88 09/21/2020    LDLCALC 100 (H) 09/20/2019   LDLCALC 106 (H) 09/06/2018   Lab Results  Component Value Date   TRIG 65.0 09/21/2020   TRIG 124.0 09/20/2019   TRIG 90.0 09/06/2018   Lab Results  Component Value Date   CHOLHDL 3 09/21/2020   CHOLHDL 4 09/20/2019   CHOLHDL 4 09/06/2018   No results found for: LDLDIRECT Diet  Iron def Lab Results  Component Value Date   WBC 7.2 09/21/2020   HGB 11.4 (L) 09/21/2020   HCT 33.8 (L) 09/21/2020   MCV 93.9 09/21/2020   PLT 452.0 (H) 09/21/2020   Lab Results  Component Value Date   IRON 17 (L) 09/21/2020   TIBC 283 06/14/2019   FERRITIN 131 06/14/2019  anemia of chronic dz and iron def  B12 def Lab Results  Component Value Date   VITAMINB12 490 09/21/2020   Lost wife and son  Redmond Pulling spends time with him  Also lost a cousin   Patient Active Problem List   Diagnosis Date Noted  . Cerumen impaction 10/03/2020  . Pedal edema 10/03/2020  . MCI (mild cognitive impairment) 10/03/2020  . Goals of care, counseling/discussion   . Palliative care by specialist   . Lower extremity weakness 06/14/2019  . Weakness 06/14/2019  . Hearing loss 09/14/2018  . Thickened nails 09/14/2018  . PVD (peripheral vascular disease) ([place]) 11/05/2017  . Fall at home 09/11/2017  . Poor balance 09/11/2017  . Vitamin D deficiency 08/28/2016  . Routine general medical examination at a health care facility 08/24/2015  . B12 deficiency 08/14/2014  . Low back pain 05/02/2014  . Urinary frequency 05/02/2014  . Encounter for Medicare annual wellness exam 07/27/2013  . Motion sickness 04/04/2013  . Osteopenia 06/14/2012  . Hypothyroid 06/14/2012  . 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 FECAL OCCULT BLOOD 02/01/2010  . ANEMIA, MILD 01/23/2010  . Essential hypertension 02/23/2009  . Carotid artery stenosis 02/23/2009  . SPONDYLOSIS, LUMBAR 02/23/2009  . NECK PAIN, CHRONIC 02/23/2009  . Chronic back pain 02/23/2009  . H/O compression fracture of spine 02/23/2009  .  HYPERCHOLESTEROLEMIA 02/21/2009  . History of prostate cancer 02/21/2009   Past Medical History:  Diagnosis Date  . Cancer [facility])    prostate  . Carotid artery stenosis   . Degenerative disc disease   . Fractures    compression  . History of prostate cancer   . Hyperlipidemia   . Hypertension   . Kyphosis   . Non-cardiac chest pain 04/11   hospital ? [facility]   Past Surgical History:  Procedure Laterality Date  . CARDIOVASCULAR STRESS TEST  2004   cardiolite negative  . carotid doppler  11/05  0-39% bilatteral  . herniated disc surgery  1990   L3-4  . INGUINAL HERNIA REPAIR  1979   right  . prostate cancer surgery    . PROSTATE SURGERY    . TONSILLECTOMY     Social History   Tobacco Use  . Smoking status: Never Smoker  . Smokeless tobacco: Never Used  Vaping Use  . Vaping Use: Never used  Substance Use Topics  . Alcohol use: Yes    Alcohol/week: 6.0 standard drinks    Types: 6 Glasses of wine per week  . Drug use: No   Family History  Problem Relation Age of Onset  . Hypertension Father    Allergies  Allergen Reactions  . Simvastatin Other (See Comments)    Aches and pains   Current Outpatient Medications on File Prior to Visit  Medication Sig Dispense Refill  . alendronate (FOSAMAX) 70 MG tablet Take 1 tablet (70 mg total) by mouth every 7 (seven) days. Take with a full glass of water on an empty stomach. 4 tablet 11  . Alpha-D-Galactosidase (BEANO) TABS Take 1 tablet by mouth as needed (before meals, to improve digestion).    Marland Kitchen amLODipine (NORVASC) 5 MG tablet Take 1 tablet (5 mg total) by mouth daily. 30 tablet 11  . aspirin EC 81 MG EC tablet Take 81 mg by mouth 3 (three) times a week.    . Cholecalciferol (VITAMIN D-3) 25 MCG (1000 UT) CAPS Take 1,000-2,000 Units by mouth daily.    Marland Kitchen guaiFENesin (MUCINEX) 600 MG 12 hr tablet Take 600 mg by mouth 2 (two) times daily as needed for to loosen phlegm.     Marland Kitchen levothyroxine (SYNTHROID) 50 MCG tablet Take 1 tablet  (50 mcg total) by mouth daily. Please make an appointment for labs 30 tablet 0  . vitamin B-12 (CYANOCOBALAMIN) 100 MCG tablet Take 1 tablet (100 mcg total) by mouth every other day. 1 tablet 0   No current facility-administered medications on file prior to visit.    Review of Systems  Constitutional: Positive for fatigue. Negative for activity change, appetite change, fever and unexpected weight change.  HENT: Negative for congestion, rhinorrhea, sore throat and trouble swallowing.   Eyes: Negative for pain, redness, itching and visual disturbance.  Respiratory: Negative for cough, chest tightness, shortness of breath and wheezing.        Mucous in throat all the time  Cardiovascular: Negative for chest pain and palpitations.  Gastrointestinal: Negative for abdominal pain, blood in stool, constipation, diarrhea and nausea.  Endocrine: Negative for cold intolerance, heat intolerance, polydipsia and polyuria.  Genitourinary: Negative for difficulty urinating, dysuria, frequency and urgency.  Musculoskeletal: Positive for arthralgias and back pain. Negative for joint swelling and myalgias.  Skin: Negative for pallor and rash.  Neurological: Negative for dizziness, tremors, speech difficulty, weakness, light-headedness, numbness and headaches.  Hematological: Negative for adenopathy. Does not bruise/bleed easily.  Psychiatric/Behavioral: Negative for decreased concentration and dysphoric mood. The patient is not nervous/anxious.        Grief reaction        Objective:   Physical Exam Constitutional:      General: He is not in acute distress.    Appearance: Normal appearance. He is well-developed, normal weight and well-nourished. He is not ill-appearing or diaphoretic.     Comments: Frail appearing elderly male in wheelchair  HENT:     Head: Normocephalic and atraumatic.     Right Ear: External ear normal. There is impacted cerumen.  Left Ear: External ear normal. There is impacted  cerumen.     Nose: Nose normal. No rhinorrhea.     Mouth/Throat:     Mouth: Oropharynx is clear and moist. Mucous membranes are moist.  Eyes:     General: No scleral icterus.       Right eye: No discharge.        Left eye: No discharge.     Extraocular Movements: EOM normal.     Conjunctiva/sclera: Conjunctivae normal.     Pupils: Pupils are equal, round, and reactive to light.  Neck:     Thyroid: No thyromegaly.     Vascular: No carotid bruit or JVD.  Cardiovascular:     Rate and Rhythm: Normal rate and regular rhythm.     Pulses: Intact distal pulses.     Heart sounds: Normal heart sounds. No gallop.      Comments: Diminished pedal pulses Feet are warm Pulmonary:     Effort: Pulmonary effort is normal. No respiratory distress.     Breath sounds: Normal breath sounds. No wheezing or rales.  Abdominal:     General: Bowel sounds are normal. There is no distension or abdominal bruit.     Palpations: Abdomen is soft. There is no mass.     Tenderness: There is no abdominal tenderness.  Genitourinary:    Prostate: Normal.  Musculoskeletal:        General: No tenderness. Normal range of motion.     Cervical back: Normal range of motion and neck supple. No tenderness.     Right lower leg: Edema present.     Left lower leg: Edema present.     Comments: One plus pedal edema   Marked kyphosis   Lymphadenopathy:     Cervical: No cervical adenopathy.  Skin:    General: Skin is warm and dry.     Coloration: Skin is not pale.     Findings: No erythema or rash.  Neurological:     Mental Status: He is alert and oriented to person, place, and time.     Cranial Nerves: No cranial nerve deficit.     Motor: No abnormal muscle tone.     Coordination: Coordination normal.     Deep Tendon Reflexes: Reflexes are normal and symmetric. Reflexes normal.  Psychiatric:        Mood and Affect: Mood and affect and mood normal.        Cognition and Memory: Memory is impaired. He exhibits impaired  recent memory.           Assessment & Plan:   Problem List Items Addressed This Visit      Cardiovascular and Mediastinum   Essential hypertension    bp in fair control at this time  BP Readings from Last 1 Encounters:  10/03/20 140/60   No changes needed Most recent labs reviewed  Disc lifstyle change with low sodium diet and exercise  Plan to continue amlodipine 5 mg daily       PVD (peripheral vascular disease) ([place])    No ulceration or claudication         Endocrine   Hypothyroid    Hypothyroidism  Pt has no clinical changes No change in energy level/ hair or skin/ edema and no tremor Lab Results  Component Value Date   TSH 5.76 (H) 09/21/2020    Has missed doses Getting pill box set up  Will continue to monitor         Nervous  and Auditory   Hearing loss    Reviewed hearing screen Declines audiology ref  Cerumen impaction noted-will start using debrox at home      Cerumen impaction    Plan to start debrox and consider ear irrigation here in a month or more         Musculoskeletal and Integument   Osteopenia    Taking fosamax and tolerates well  Multiple falls-disc fall prev in great detail  H/o comp fx (healed)  Taking vit D-inst to inc daily dose to 4000 iu        Other   HYPERCHOLESTEROLEMIA    Fairly good profile today  LDL 88 Disc goals for lipids and reasons to control them Rev last labs with pt Rev low sat fat diet in detail Diet controlled      ANEMIA, MILD    With low iron  Suggest niferex 150 mg one daily  I do not feel we need to look for cause at his age Continue to monitor       Encounter for Medicare annual wellness exam - Primary    Reviewed health habits including diet and exercise and skin cancer prevention Reviewed appropriate screening tests for age  Also reviewed health mt list, fam hx and immunization status , as well as social and family history   See HPI No cancer screen at his age utd imms  Fall prev  discussed  Memory and cognition disc- px aricept to consider with printed info  Disc goals for comfort/under palliative care also Adv directive is utd  Hearing screen reviewed-declined audiology/will try removing ear wax      B12 deficiency    Lab Results  Component Value Date   VITAMINB12 490 09/21/2020   Plan to continue oral supplementation       Routine general medical examination at a health care facility    Reviewed health habits including diet and exercise and skin cancer prevention Reviewed appropriate screening tests for age  Also reviewed health mt list, fam hx and immunization status , as well as social and family history   See HPI No cancer screen at his age utd imms  Fall prev discussed  Memory and cognition disc- px aricept to consider with printed info  Disc goals for comfort/under palliative care also Adv directive is utd  Hearing screen reviewed-declined audiology/will try removing ear wax      Vitamin D deficiency    Level in 66s Will inc to 4000 iu daily  Disc imp to bone and overall health      Poor balance    May consider more PT once pandemic quiets more      Palliative care by specialist    Care goals discussed      Pedal edema    With venous insufficiency Recommend knee high comp socks during the day  Also leg elevation when sitting       MCI (mild cognitive impairment)    Early dementia suspect  Does not know date/time/day of week  occ disoriented Good long term memory  Given info on aricept and also px -considering 5 mg qhs -family to discuss

## 2020-10-03 NOTE — Assessment & Plan Note (Signed)
bp in fair control at this time  BP Readings from Last 1 Encounters:  10/03/20 140/60   No changes needed Most recent labs reviewed  Disc lifstyle change with low sodium diet and exercise  Plan to continue amlodipine 5 mg daily

## 2020-10-03 NOTE — Assessment & Plan Note (Signed)
Lab Results  Component Value Date   VITAMINB12 490 09/21/2020   Plan to continue oral supplementation

## 2020-10-03 NOTE — Assessment & Plan Note (Signed)
Early dementia suspect  Does not know date/time/day of week  occ disoriented Good long term memory  Given info on aricept and also px -considering 5 mg qhs -family to discuss

## 2020-10-03 NOTE — Assessment & Plan Note (Signed)
Level in 30s Will inc to 4000 iu daily  Disc imp to bone and overall health

## 2020-10-03 NOTE — Assessment & Plan Note (Signed)
Fairly good profile today  LDL 88 Disc goals for lipids and reasons to control them Rev last labs with pt Rev low sat fat diet in detail Diet controlled

## 2020-10-03 NOTE — Assessment & Plan Note (Signed)
Reviewed hearing screen Declines audiology ref  Cerumen impaction noted-will start using debrox at home

## 2020-10-03 NOTE — Assessment & Plan Note (Signed)
Care goals discussed

## 2020-10-25 ENCOUNTER — Other Ambulatory Visit: Payer: Medicare HMO | Admitting: Hospice

## 2020-10-25 ENCOUNTER — Other Ambulatory Visit: Payer: Self-pay

## 2020-10-25 DIAGNOSIS — R531 Weakness: Secondary | ICD-10-CM | POA: Diagnosis not present

## 2020-10-25 DIAGNOSIS — Z515 Encounter for palliative care: Secondary | ICD-10-CM | POA: Diagnosis not present

## 2020-10-25 NOTE — Progress Notes (Signed)
[facility] Consult Note Telephone: [phone]  Fax: [phone]  PATIENT NAME: [name] DOB: 1920-07-26 MRN: [id number]  PRIMARY CARE PROVIDER:   Abner Greenspan, MD Tower, Wynelle Fanny, MD [place],  [place] [id number]  [place]: Abner Greenspan, MD Abner Greenspan, MD [place],  [place] [address]    [place] [phone] Emergency contact  Delorse Limber [phone] c  TELEHEALTH VISIT STATEMENT Due to the COVID-19 crisis, this visit was done via telephone from my office. It was initiated and consented to by this patient and/or family.  Visit is to build trust and highlight Palliative Medicine as specialized medical care for people living with serious illness, aimed at facilitating better quality of life through symptoms relief, assisting with advance care plan and establishing goals of care.   CHIEF COMPLAINT: Follow up palliative visit/weakness  RECOMMENDATIONS/PLAN:   1. Advance Care Planning/Code Status: Discussion with son who was visiting -[name] on patient's CODE STATUS.  He said there is need to discuss more within the family and with patient to determine if it is time to change his CODE STATUS.  At this time, patient remains a partial code; chest compressions but no intubation.  2. Goals of Care: Goals of care include to maximize quality of life and symptom management.  Visit consisted of counseling and education dealing with the complex and emotionally intense issues of symptom management and palliative care in the setting of serious and potentially life-threatening illness. Palliative care team will continue to support patient, patient's family, and medical team.  I spent 20 minutes providing this consultation. More than 50% of the time in this consultation was spent on coordinating communication.   -------------------------------------------------------------------------------------------------------------------------------------------------- 3. Symptom management/Plan:  Generalized body weakness: Continue activities as tolerated.  Use rolling walker for support to help prevent fall.  Balance of rest and performance activity. Palliative will continue to monitor for symptom management/decline and make recommendations as needed. Return 2 months or prn. Encouraged to call provider sooner with any concerns.   HISTORY OF PRESENT ILLNESS:  [name] is a 87 y.o. male with multiple medical problems including generalized body weakness which is chronic in the context of advancing age, worst in the morning and improves during the day; denies headaches or dizziness or feeling of syncope.  Patient's ambulation is limited by weakness; he ambulates with his rolling walker mostly within the house.  Appetite is good, no weight loss.  Weakness impairs his independence and activities of daily living.  He has help at home for assistance with activities of daily living.  History of hypertension, hypothyroidism, history of prostate cancer in remission.  History obtained from review of EMR, discussion with patient/family. Review and summarization of Epic records shows history from other than patient. Rest of 10 point ROS asked and negative. Palliative Care was asked to follow this patient by consultation request of [place], Wynelle Fanny, MD to help address complex decision making in the context of goals of care.   CODE STATUS: Partial code  PPS: 40%  HOSPICE ELIGIBILITY/DIAGNOSIS: TBD  PAST MEDICAL HISTORY:  Past Medical History:  Diagnosis Date  . Cancer [facility])    prostate  . Carotid artery stenosis   . Degenerative disc disease   . Fractures    compression  . History of prostate cancer   . Hyperlipidemia   . Hypertension   . Kyphosis   .  Non-cardiac chest pain 04/11   hospital ? [facility]    SOCIAL HX: @SOCX   Patient lives at home. He has 24/7 care at home.   FAMILY HX:  Family History  Problem Relation Age of Onset  . Hypertension Father     Review lab tests/diagnostics No results for input(s): WBC, HGB, HCT, PLT, MCV in the last 168 hours. No results for input(s): NA, K, CL, CO2, BUN, CREATININE, GLUCOSE in the last 168 hours. Latest GFR by Cockcroft Gault (not valid in AKI or ESRD) CrCl cannot be calculated (Patient's most recent lab result is older than the maximum 21 days allowed.). No results for input(s): AST, ALT, ALKPHOS, GGT in the last 168 hours.  Invalid input(s): TBILI, CONJBILI, ALB, TOTALPROTEIN No components found for: ALB No results for input(s): APTT, INR in the last 168 hours.  Invalid input(s): PTPATIENT No results for input(s): BNP, PROBNP in the last 168 hours.  ALLERGIES:  Allergies  Allergen Reactions  . Simvastatin Other (See Comments)    Aches and pains      PERTINENT MEDICATIONS:  Outpatient Encounter Medications as of 10/25/2020  Medication Sig  . alendronate (FOSAMAX) 70 MG tablet Take 1 tablet (70 mg total) by mouth every 7 (seven) days. Take with a full glass of water on an empty stomach.  . Alpha-D-Galactosidase (BEANO) TABS Take 1 tablet by mouth as needed (before meals, to improve digestion).  Marland Kitchen amLODipine (NORVASC) 5 MG tablet Take 1 tablet (5 mg total) by mouth daily.  Marland Kitchen aspirin EC 81 MG EC tablet Take 81 mg by mouth 3 (three) times a week.  . Cholecalciferol (VITAMIN D-3) 25 MCG (1000 UT) CAPS Take 1,000-2,000 Units by mouth daily.  Marland Kitchen donepezil (ARICEPT) 5 MG tablet Take 1 tablet (5 mg total) by mouth at bedtime.  Marland Kitchen guaiFENesin (MUCINEX) 600 MG 12 hr tablet Take 600 mg by mouth 2 (two) times daily as needed for to loosen phlegm.   Marland Kitchen levothyroxine (SYNTHROID) 50 MCG tablet Take 1 tablet (50 mcg total) by mouth daily. Please make an appointment for labs  . vitamin B-12 (CYANOCOBALAMIN) 100 MCG tablet Take 1 tablet (100 mcg total) by mouth every  other day.   No facility-administered encounter medications on file as of 10/25/2020.    ROS  General: NAD EYES: denies vision changes ENMT: denies dysphagia no xerostomia Cardiovascular: denies chest pain Pulmonary: denies  cough, denies SOB  Abdomen: endorses good appetite, no constipation or diarrhea GU: denies dysuria or urinary frquency [facility]K:  endorses ROM limitations, ambulates with rolling walker, no falls reported Skin: denies rashes or wounds Neurological: endorses weakness, denies pain, denies insomnia Psych: Endorses positive mood Heme/lymph/immuno: denies bruises, abnormal bleeding   Thank you for the opportunity to participate in the care of [name] Please call our office at [phone] if we can be of additional assistance.  Note: Portions of this note were generated with [employer]. Dictation errors may occur despite best attempts at proofreading.  Teodoro Spray, NP

## 2020-10-31 ENCOUNTER — Other Ambulatory Visit: Payer: Self-pay | Admitting: Family Medicine

## 2020-11-04 IMAGING — DX DG CHEST 1V PORT
1 series · 1 of 1 positions shown · non-contrast
Comparison: 12/07/2009, 06/09/2006

CLINICAL DATA: Weakness

EXAM:
PORTABLE CHEST 1 VIEW

[chest ap]
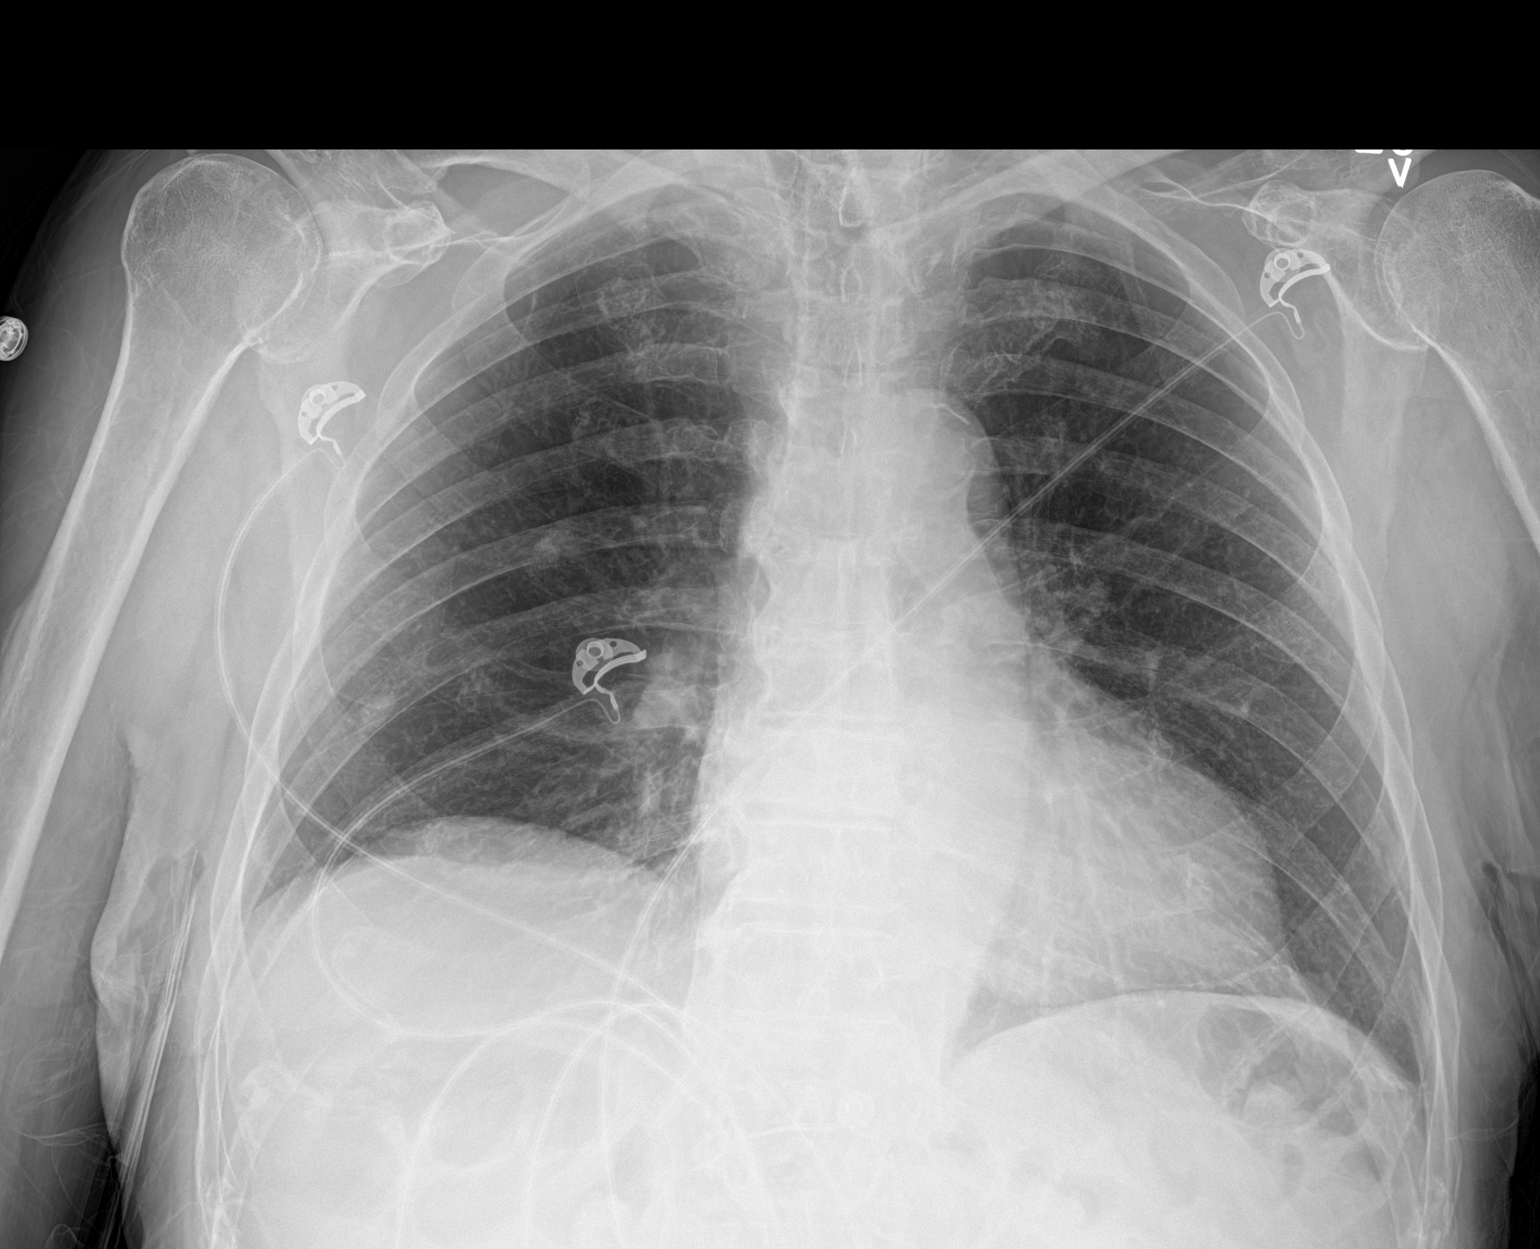

[1 of 1 positions shown; findings below may reference images not displayed]

FINDINGS: Cardiac shadow is stable. Aortic calcifications are seen. The lungs
are well aerated bilaterally. Small nodular densities are noted in
the right mid lung which were not present on the prior exam. The
inferior nodular density is not well appreciated on the previous
exam from 6566 but was present in 7664 and corresponds to an area of
bony sclerosis. It is possible that the more superior nodule is
related to sclerosis in the anterior third rib. Oblique images may
be helpful.
IMPRESSION: Nodular densities in the right lung as described. One of these is
chronic related to the posterior right eighth rib. The more superior
nodule may be related to the anterior third rib. Oblique imaging may
be helpful.

## 2020-11-07 DIAGNOSIS — K219 Gastro-esophageal reflux disease without esophagitis: Secondary | ICD-10-CM | POA: Diagnosis not present

## 2020-11-07 DIAGNOSIS — Z008 Encounter for other general examination: Secondary | ICD-10-CM | POA: Diagnosis not present

## 2020-11-07 DIAGNOSIS — E039 Hypothyroidism, unspecified: Secondary | ICD-10-CM | POA: Diagnosis not present

## 2020-11-07 DIAGNOSIS — M1990 Unspecified osteoarthritis, unspecified site: Secondary | ICD-10-CM | POA: Diagnosis not present

## 2020-11-07 DIAGNOSIS — M810 Age-related osteoporosis without current pathological fracture: Secondary | ICD-10-CM | POA: Diagnosis not present

## 2020-11-07 DIAGNOSIS — I739 Peripheral vascular disease, unspecified: Secondary | ICD-10-CM | POA: Diagnosis not present

## 2020-11-07 DIAGNOSIS — R269 Unspecified abnormalities of gait and mobility: Secondary | ICD-10-CM | POA: Diagnosis not present

## 2020-11-07 DIAGNOSIS — R030 Elevated blood-pressure reading, without diagnosis of hypertension: Secondary | ICD-10-CM | POA: Diagnosis not present

## 2020-11-07 DIAGNOSIS — R32 Unspecified urinary incontinence: Secondary | ICD-10-CM | POA: Diagnosis not present

## 2020-11-07 DIAGNOSIS — Z7983 Long term (current) use of bisphosphonates: Secondary | ICD-10-CM | POA: Diagnosis not present

## 2020-11-07 DIAGNOSIS — N529 Male erectile dysfunction, unspecified: Secondary | ICD-10-CM | POA: Diagnosis not present

## 2020-11-11 IMAGING — CT CT HEAD W/O CM
4 series · 17 of 47 positions shown, 19 images · non-contrast
Comparison: CT head dated June 07, 2019.

CLINICAL DATA: Bilateral leg weakness.

EXAM:
CT HEAD WITHOUT CONTRAST
TECHNIQUE: Contiguous axial images were obtained from the base of the skull
through the vertex without intravenous contrast.

[Series 3: head (person_name) · axial · 0.44mm/px · z∈[+1092,+1236]mm · 7 of 39 slices shown, 9 images]
[im 5/39  brain]
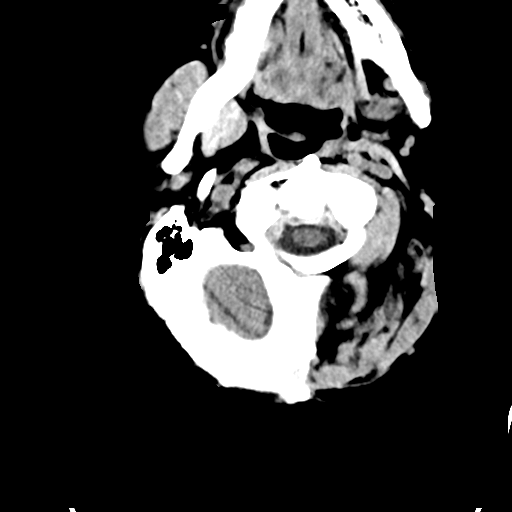
[im 5/39  bone]
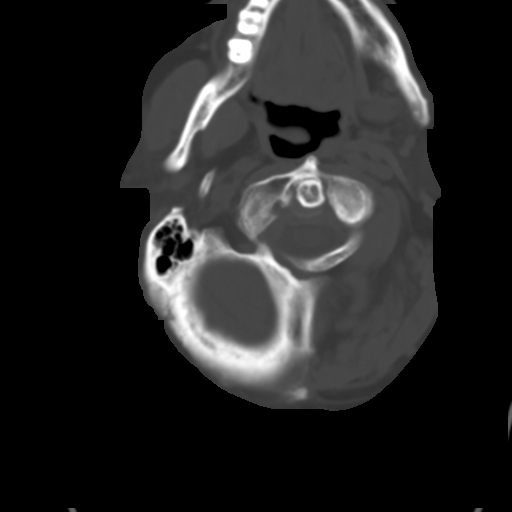
[im 10/39  brain]
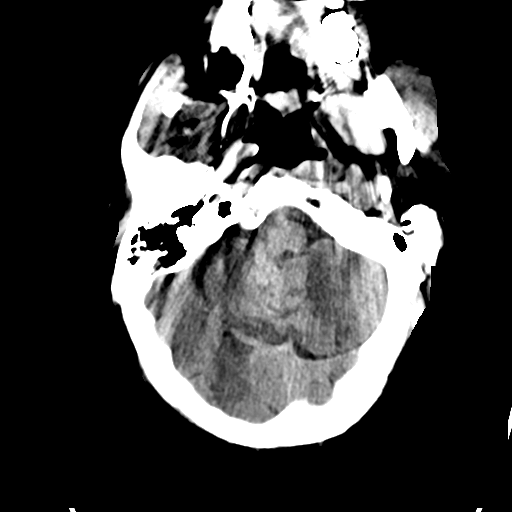
[im 15/39  brain]
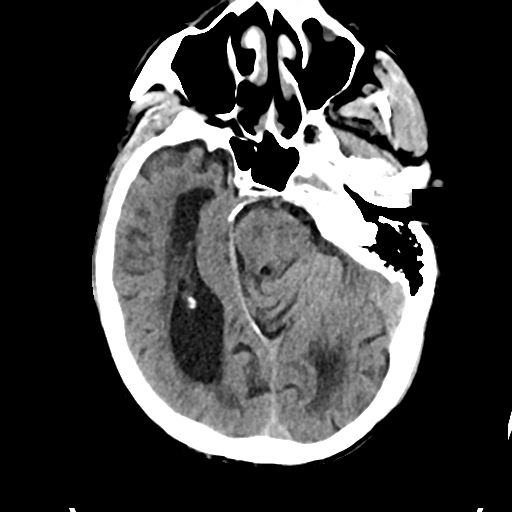
[im 20/39  brain]
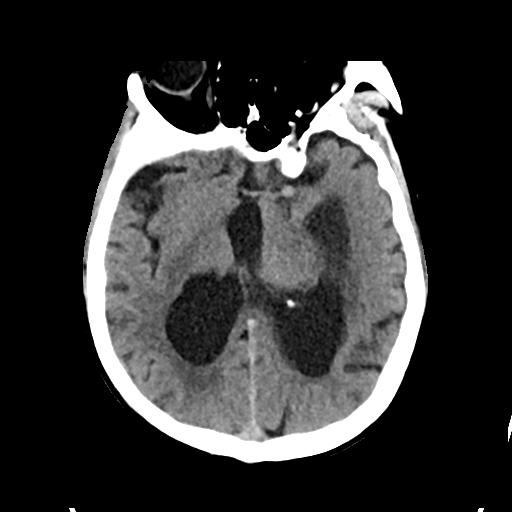
[im 24/39  brain]
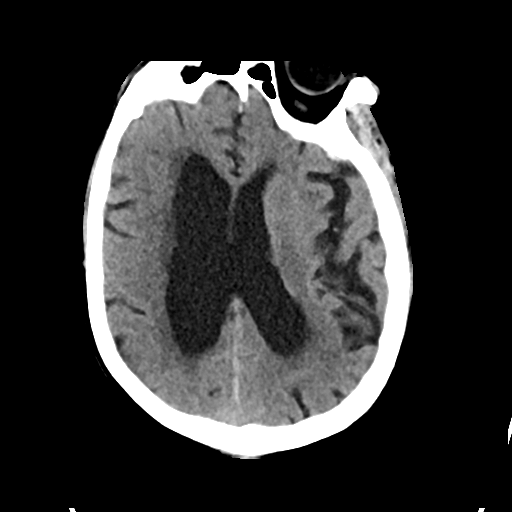
[im 24/39  bone]
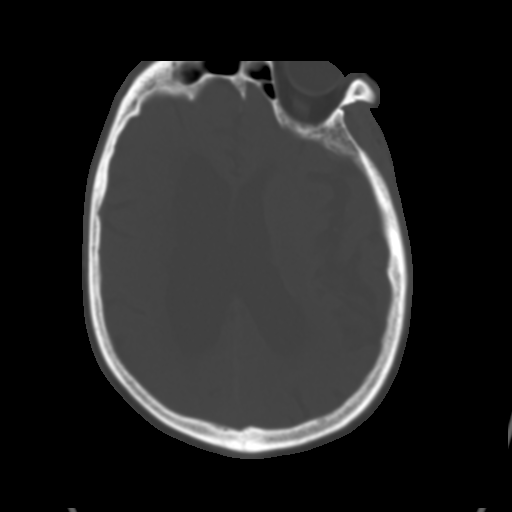
[im 29/39  brain]
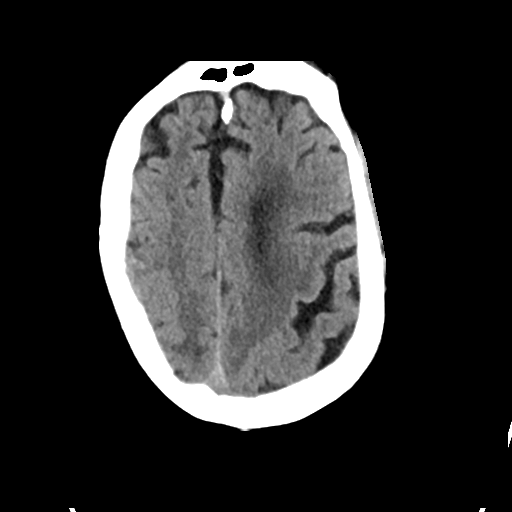
[im 34/39  brain]
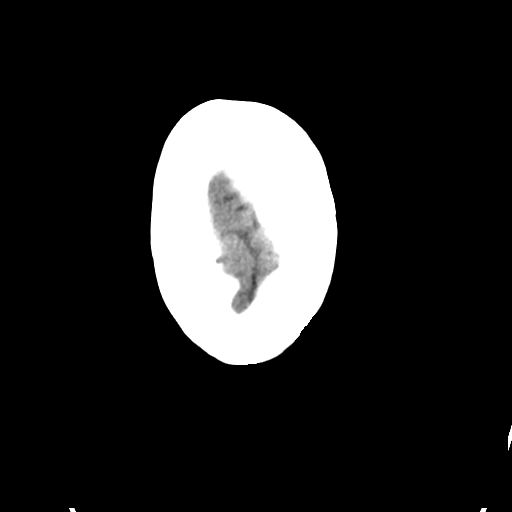

[Series 4: head bone (person_name) · axial · 0.44mm/px · z∈[+1090,+1158]mm · 4 of 97 slices shown]
[im 10/97  bone]
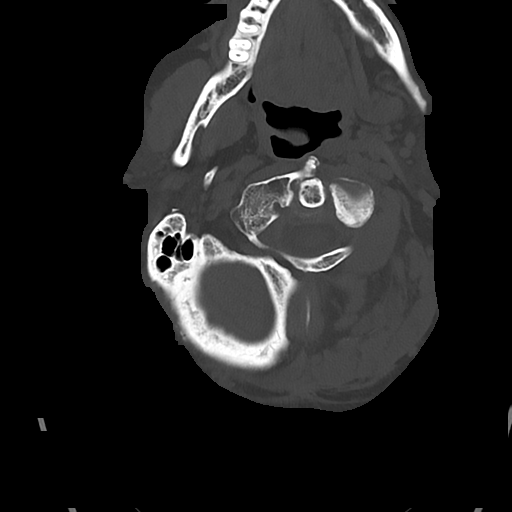
[im 20/97  bone]
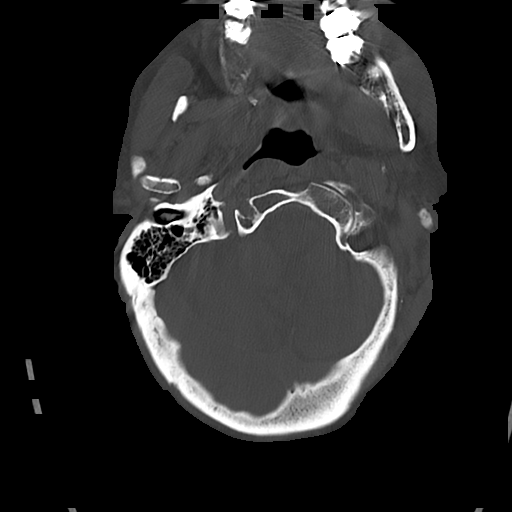
[im 29/97  bone]
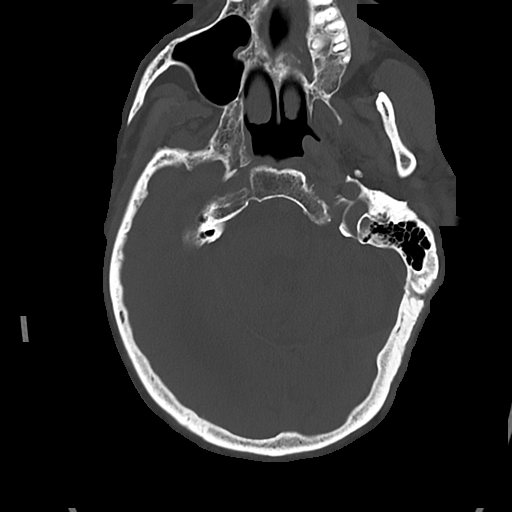
[im 44/97  bone]
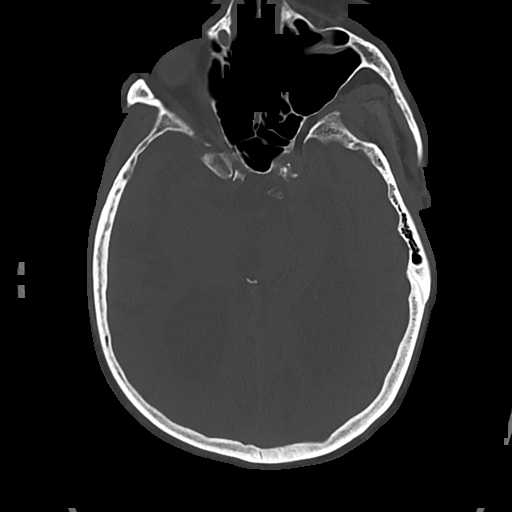

[Series 5: cor soft (person_name) · coronal · 0.38mm/px · 3 of 77 slices shown]
[im 26/77  brain]
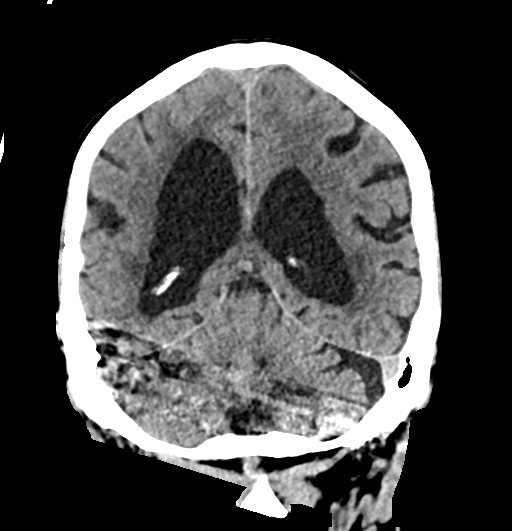
[im 34/77  brain]
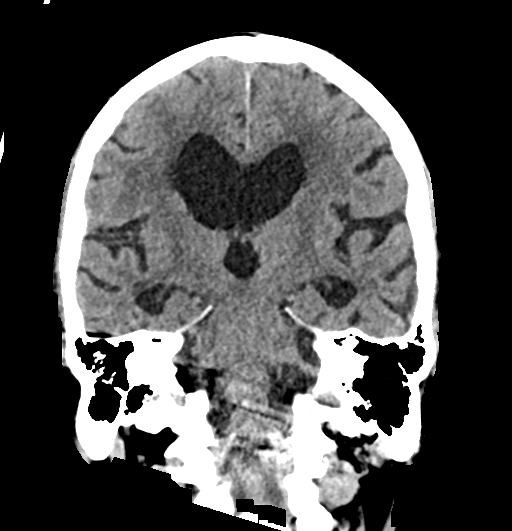
[im 43/77  brain]
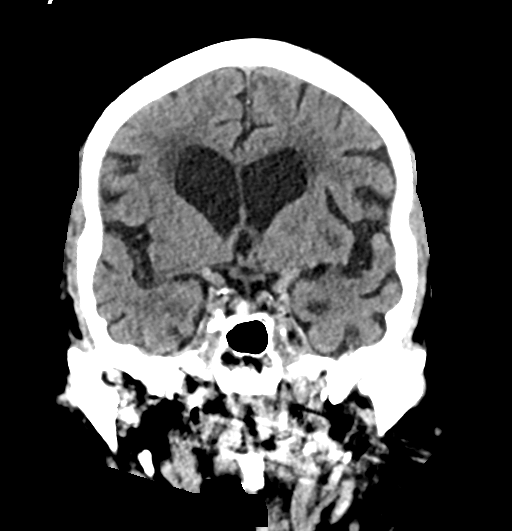

[Series 6: sag soft · sagittal · 0.41mm/px · 3 of 67 slices shown]
[im 30/67  brain]
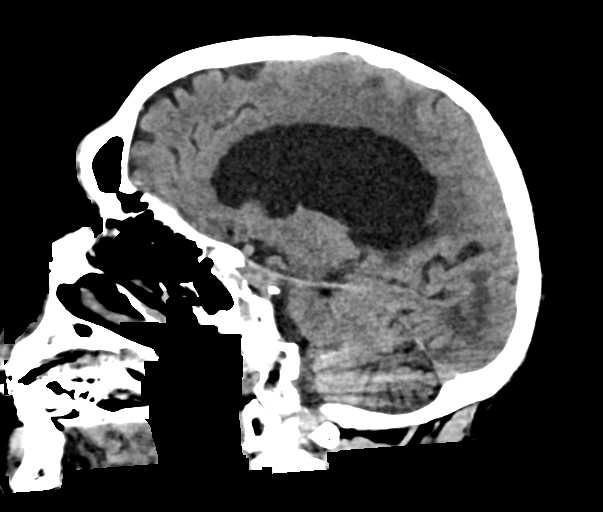
[im 34/67  brain]
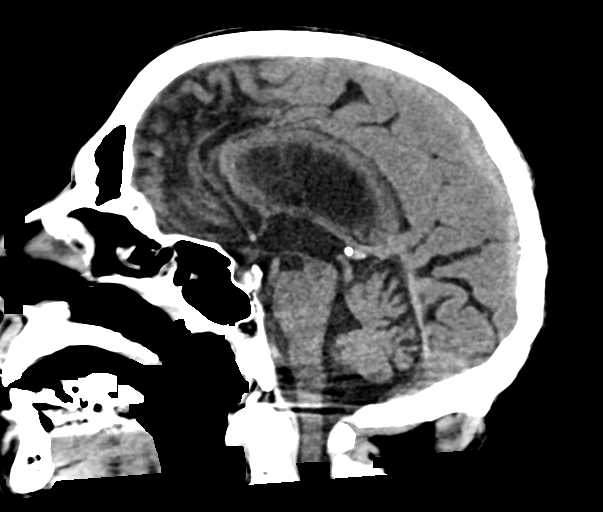
[im 37/67  brain]
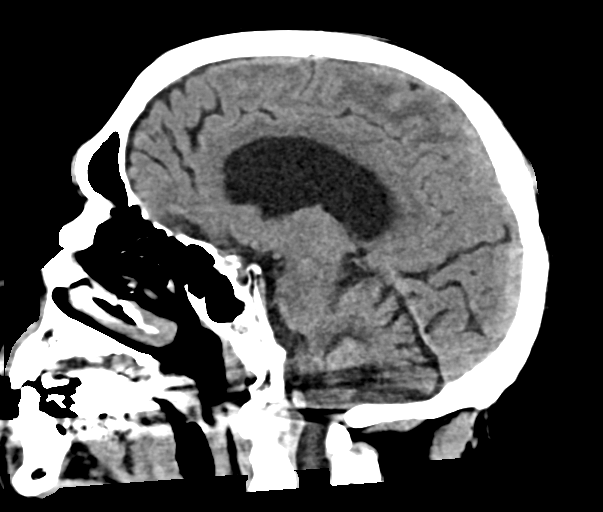

[17 of 47 positions shown; findings below may reference images not displayed]

FINDINGS: Brain: No evidence of acute infarction, hemorrhage, hydrocephalus,
extra-axial collection or mass lesion/mass effect. Stable moderate
atrophy and chronic microvascular ischemic changes.

Vascular: Atherosclerotic vascular calcification of the carotid
siphons. No hyperdense vessel.

Skull: Negative for fracture or focal lesion.

Sinuses/Orbits: No acute finding.

Other: None.
IMPRESSION: 1.  No acute intracranial abnormality.
2. Stable moderate atrophy and chronic microvascular ischemic
changes.

## 2020-11-22 ENCOUNTER — Other Ambulatory Visit: Payer: Medicare HMO | Admitting: Hospice

## 2020-11-22 ENCOUNTER — Other Ambulatory Visit: Payer: Self-pay

## 2020-11-22 DIAGNOSIS — Z515 Encounter for palliative care: Secondary | ICD-10-CM

## 2020-11-22 DIAGNOSIS — R531 Weakness: Secondary | ICD-10-CM

## 2020-11-22 NOTE — Progress Notes (Signed)
[facility] Consult Note Telephone: [phone]  Fax: [phone]  PATIENT NAME: [name] DOB: 26-Jun-1920 MRN: [id number]  PRIMARY CARE PROVIDER:   Abner Greenspan, MD [place], Wynelle Fanny, MD [place],  [place] [address]  [place]: Abner Greenspan, MD Abner Greenspan, MD [place],  [place] [address]  [place] [phone] Emergency contact  Delorse Limber [phone] c  TELEHEALTH VISIT STATEMENT Due to the COVID-19 crisis, this visit was done via telemedicine and it was initiated and consent by this patient and or family. Video-audio (telehealth) contact was unable to be done due to technical barriers from the patient's side.  Visit is to build trust and highlight Palliative Medicine as specialized medical care for people living with serious illness, aimed at facilitating better quality of life through symptoms relief, assisting with advance care plan and establishing goals of care.   CHIEF COMPLAINT: Palliative follow up visit/weakness  RECOMMENDATIONS/PLAN:   1. Advance Care Planning/Code Status: Discussion today with patient/[name] on advance care planning and CODE STATUS.  Patient remains a partial code: Chest compressions with no intubation/mechanical ventilation.  [name] said this could be changed in the future when appropriate.  2. Goals of Care: Goals of care include to maximize quality of life and symptom management.  MOST selections include attempt resuscitation, limited additional intervention, IV fluids as indicated, antibiotics as indicated, feeding tube for defined trial.  Palliative care team will continue to support patient, patient's family, and medical team.  I spent 16 minutes providing this consultation. More than 50% of the time in this consultation was spent on coordinating communication.   -------------------------------------------------------------------------------------------------------------------------------------------------- 3. Symptom management/Plan:  Generalized body weakness: Improving.  Continue activities as tolerated. Discussion on use of rolling walker for support to help prevent fall; fall precautions.  Continue to maintain balance of rest and performance activity. Patient reports he is on a trial medication for his prostate; appointment to see the urologist in May 2022. Palliative will continue to monitor for symptom management/decline and make recommendations as needed. Return 2 months or prn.  Patient/[name] prefers telehealth medicine at this time due to COVID-19 virus.  Encouraged to call provider sooner with any concerns.    HISTORY OF PRESENT ILLNESS:  [name] is a 87 y.o. male with multiple medical problems including history of prostate cancer in remission, hypothyroidism, hypertension, generalized body weakness.  Follow-up visit to review generalized body weakness which is chronic in the context of advancing age, worst in the morning and improves during the day.  Patient said he has perked up some.  He denies headaches or dizziness or feeling of syncope.  He continues to ambulate with his rolling walker within his house. Appetite is good, no reported weight loss.  Weakness impairs his independence and activities of daily living.  He has help at home for assistance with activities of daily living. History obtained from review of EMR, discussion with patient/family. Review and summarization of Epic records shows history from other than patient. Rest of 10 point ROS asked and negative.  Palliative Care was asked to follow this patient by consultation request of [place], Wynelle Fanny, MD to help address complex decision making in the context of advance care planning and goals of care clarification.   CODE STATUS: Partial code  PPS: 40%  HOSPICE  ELIGIBILITY/DIAGNOSIS: TBD  PAST MEDICAL HISTORY:  Past Medical History:  Diagnosis Date  .  Cancer [facility])    prostate  . Carotid artery stenosis   . Degenerative disc disease   . Fractures    compression  . History of prostate cancer   . Hyperlipidemia   . Hypertension   . Kyphosis   . Non-cardiac chest pain 04/11   hospital ? [facility]    SOCIAL HX: @SOCX  Patient at home for ongoing care   FAMILY HX:  Family History  Problem Relation Age of Onset  . Hypertension Father     Review lab tests/diagnostics No results for input(s): WBC, HGB, HCT, PLT, MCV in the last 168 hours. No results for input(s): NA, K, CL, CO2, BUN, CREATININE, GLUCOSE in the last 168 hours. Latest GFR by Cockcroft Gault (not valid in AKI or ESRD) CrCl cannot be calculated (Patient's most recent lab result is older than the maximum 21 days allowed.). No results for input(s): AST, ALT, ALKPHOS, GGT in the last 168 hours.  Invalid input(s): TBILI, CONJBILI, ALB, TOTALPROTEIN No components found for: ALB No results for input(s): APTT, INR in the last 168 hours.  Invalid input(s): PTPATIENT No results for input(s): BNP, PROBNP in the last 168 hours.  ALLERGIES:  Allergies  Allergen Reactions  . Simvastatin Other (See Comments)    Aches and pains      PERTINENT MEDICATIONS:  Outpatient Encounter Medications as of 11/22/2020  Medication Sig  . alendronate (FOSAMAX) 70 MG tablet TAKE 1 TABLET (70 MG TOTAL) BY MOUTH EVERY 7 (SEVEN) DAYS. TAKE WITH A FULL GLASS OF WATER ON AN EMPTY STOMACH.  Marland Kitchen Alpha-D-Galactosidase (BEANO) TABS Take 1 tablet by mouth as needed (before meals, to improve digestion).  Marland Kitchen amLODipine (NORVASC) 5 MG tablet Take 1 tablet (5 mg total) by mouth daily.  Marland Kitchen aspirin EC 81 MG EC tablet Take 81 mg by mouth 3 (three) times a week.  . Cholecalciferol (VITAMIN D-3) 25 MCG (1000 UT) CAPS Take 1,000-2,000 Units by mouth daily.  Marland Kitchen donepezil (ARICEPT) 5 MG tablet Take 1 tablet (5 mg total) by mouth  at bedtime.  Marland Kitchen guaiFENesin (MUCINEX) 600 MG 12 hr tablet Take 600 mg by mouth 2 (two) times daily as needed for to loosen phlegm.   Marland Kitchen levothyroxine (SYNTHROID) 50 MCG tablet Take 1 tablet (50 mcg total) by mouth daily. Please make an appointment for labs  . vitamin B-12 (CYANOCOBALAMIN) 100 MCG tablet Take 1 tablet (100 mcg total) by mouth every other day.   No facility-administered encounter medications on file as of 11/22/2020.    Physical exam deferred due to telehealth medicine.  Thank you for the opportunity to participate in the care of [name] Please call our office at [phone] if we can be of additional assistance.  Note: Portions of this note were generated with [employer]. Dictation errors may occur despite best attempts at proofreading.  Teodoro Spray, NP

## 2020-11-24 ENCOUNTER — Other Ambulatory Visit: Payer: Self-pay | Admitting: Family Medicine

## 2020-11-27 ENCOUNTER — Other Ambulatory Visit: Payer: Self-pay

## 2020-11-27 ENCOUNTER — Ambulatory Visit: Payer: Medicare HMO | Admitting: Podiatry

## 2020-11-27 DIAGNOSIS — M79674 Pain in right toe(s): Secondary | ICD-10-CM | POA: Diagnosis not present

## 2020-11-27 DIAGNOSIS — M79675 Pain in left toe(s): Secondary | ICD-10-CM | POA: Diagnosis not present

## 2020-11-27 DIAGNOSIS — B351 Tinea unguium: Secondary | ICD-10-CM | POA: Diagnosis not present

## 2020-12-02 ENCOUNTER — Encounter: Payer: Self-pay | Admitting: Podiatry

## 2020-12-02 NOTE — Progress Notes (Signed)
Subjective: [name] presents today for follow up of painful, elongated mycotic toenails. His daughter is present during today's visit. Daughter is present during today's visit. States she purchased her dad some very good shoes from [employer]. They are comfortable for him. They voice no new pedal concerns on today's visit.  Allergies  Allergen Reactions  . Simvastatin Other (See Comments)    Aches and pains     Social History   Occupational History  . 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 Smoking status: Never Smoker  . Smokeless tobacco: Never Used  Vaping Use  . Vaping Use: Never used  Substance and Sexual Activity  . Alcohol use: Yes    Alcohol/week: 6.0 standard drinks    Types: 6 Glasses of wine per week  . Drug use: No  . Sexual activity: Never     Objective: There were no vitals filed for this visit.  Pt is a pleasant 87 y.o.Caucasian male, WD, WN in NAD.  AAO x 3.   Vascular Examination:  Capillary fill time to digits <3 seconds b/l. Faintly palpable DP pulses b/l. Nonpalpable PT pulses b/l. Pedal hair absent b/l Skin temperature gradient within normal limits b/l. Skin temperature gradient warm to cool b/l. Dependent rubor noted b/l. Trace edema noted b/l feet.  Dermatological Examination: Pedal skin is thin shiny, atrophic bilaterally. No open wounds bilaterally. No interdigital macerations bilaterally. Toenails 1-5 b/l elongated, dystrophic, thickened, crumbly with subungual debris and tenderness to dorsal palpation.   Musculoskeletal: Normal muscle strength 5/5 to all lower extremity muscle groups bilaterally. No pain crepitus or joint limitation noted with ROM b/l. Hallux valgus with bunion deformity noted b/l lower extremities. Wearing appropriate fitting shoe gear.  Neurological: Protective sensation intact 5/5 intact bilaterally with 10g monofilament b/l. Vibratory sensation intact b/l.  Babinski reflex negative b/l. Clonus negative b/l.  Assessment: 1. Pain due to onychomycosis of toenails of both feet     Plan: -No new findings. No new orders. -Toenails 1-5 b/l were debrided in length and girth with sterile nail nippers and dremel without iatrogenic bleeding.  -He is on palliative care services. -Patient to continue soft, supportive shoe gear daily. -Patient to report any pedal injuries to medical professional immediately. -Patient/POA to call should there be question/concern in the interim.  Return in about 3 months (around 02/27/2021).

## 2020-12-07 ENCOUNTER — Telehealth: Payer: Self-pay

## 2020-12-07 ENCOUNTER — Other Ambulatory Visit: Payer: Self-pay

## 2020-12-07 DIAGNOSIS — R350 Frequency of micturition: Secondary | ICD-10-CM

## 2020-12-07 MED ORDER — GEMTESA 75 MG PO TABS: 1.0000 | ORAL_TABLET | Freq: Every day | ORAL | 3 refills | Status: AC

## 2020-12-07 NOTE — Telephone Encounter (Signed)
[facility] called and said they needed a subscription placed for [employer] Prescription done.

## 2020-12-10 DIAGNOSIS — Z8546 Personal history of malignant neoplasm of prostate: Secondary | ICD-10-CM | POA: Diagnosis not present

## 2020-12-10 DIAGNOSIS — N3946 Mixed incontinence: Secondary | ICD-10-CM | POA: Diagnosis not present

## 2020-12-10 DIAGNOSIS — R351 Nocturia: Secondary | ICD-10-CM | POA: Diagnosis not present

## 2021-01-03 ENCOUNTER — Encounter (HOSPITAL_COMMUNITY): Payer: Self-pay

## 2021-01-03 ENCOUNTER — Telehealth: Payer: Self-pay

## 2021-01-03 ENCOUNTER — Emergency Department (HOSPITAL_COMMUNITY): Payer: Medicare HMO

## 2021-01-03 ENCOUNTER — Emergency Department (HOSPITAL_COMMUNITY)
Admission: EM | Admit: 2021-01-03 | Discharge: 2021-01-04 | Disposition: A | Discharge status: Home / Self Care | Payer: Medicare HMO | Attending: Emergency Medicine | Admitting: Emergency Medicine

## 2021-01-03 DIAGNOSIS — I10 Essential (primary) hypertension: Secondary | ICD-10-CM | POA: Insufficient documentation

## 2021-01-03 DIAGNOSIS — G319 Degenerative disease of nervous system, unspecified: Secondary | ICD-10-CM | POA: Diagnosis not present

## 2021-01-03 DIAGNOSIS — Z743 Need for continuous supervision: Secondary | ICD-10-CM | POA: Diagnosis not present

## 2021-01-03 DIAGNOSIS — S0083XA Contusion of other part of head, initial encounter: Secondary | ICD-10-CM | POA: Insufficient documentation

## 2021-01-03 DIAGNOSIS — Z79899 Other long term (current) drug therapy: Secondary | ICD-10-CM | POA: Diagnosis not present

## 2021-01-03 DIAGNOSIS — Z8546 Personal history of malignant neoplasm of prostate: Secondary | ICD-10-CM | POA: Insufficient documentation

## 2021-01-03 DIAGNOSIS — E039 Hypothyroidism, unspecified: Secondary | ICD-10-CM | POA: Diagnosis not present

## 2021-01-03 DIAGNOSIS — J3489 Other specified disorders of nose and nasal sinuses: Secondary | ICD-10-CM | POA: Diagnosis not present

## 2021-01-03 DIAGNOSIS — M4182 Other forms of scoliosis, cervical region: Secondary | ICD-10-CM | POA: Diagnosis not present

## 2021-01-03 DIAGNOSIS — S0093XA Contusion of unspecified part of head, initial encounter: Secondary | ICD-10-CM

## 2021-01-03 DIAGNOSIS — I6782 Cerebral ischemia: Secondary | ICD-10-CM | POA: Diagnosis not present

## 2021-01-03 DIAGNOSIS — S199XXA Unspecified injury of neck, initial encounter: Secondary | ICD-10-CM | POA: Diagnosis not present

## 2021-01-03 DIAGNOSIS — R5381 Other malaise: Secondary | ICD-10-CM | POA: Diagnosis not present

## 2021-01-03 DIAGNOSIS — W06XXXA Fall from bed, initial encounter: Secondary | ICD-10-CM | POA: Insufficient documentation

## 2021-01-03 DIAGNOSIS — S0990XA Unspecified injury of head, initial encounter: Secondary | ICD-10-CM | POA: Diagnosis not present

## 2021-01-03 DIAGNOSIS — Z7982 Long term (current) use of aspirin: Secondary | ICD-10-CM | POA: Insufficient documentation

## 2021-01-03 DIAGNOSIS — M47812 Spondylosis without myelopathy or radiculopathy, cervical region: Secondary | ICD-10-CM | POA: Diagnosis not present

## 2021-01-03 DIAGNOSIS — W19XXXA Unspecified fall, initial encounter: Secondary | ICD-10-CM

## 2021-01-03 LAB — CBC WITH DIFFERENTIAL/PLATELET
Abs Immature Granulocytes: 0.07 10*3/uL (ref 0.00–0.07)
Basophils Absolute: 0 10*3/uL (ref 0.0–0.1)
Basophils Relative: 0 %
Eosinophils Absolute: 0 10*3/uL (ref 0.0–0.5)
Eosinophils Relative: 0 %
HCT: 34.5 % — ABNORMAL LOW (ref 39.0–52.0)
Hemoglobin: 11.1 g/dL — ABNORMAL LOW (ref 13.0–17.0)
Immature Granulocytes: 0 %
Lymphocytes Relative: 5 %
Lymphs Abs: 0.9 10*3/uL (ref 0.7–4.0)
MCH: 30.7 pg (ref 26.0–34.0)
MCHC: 32.2 g/dL (ref 30.0–36.0)
MCV: 95.3 fL (ref 80.0–100.0)
Monocytes Absolute: 2.6 10*3/uL — ABNORMAL HIGH (ref 0.1–1.0)
Monocytes Relative: 16 %
Neutro Abs: 12.4 10*3/uL — ABNORMAL HIGH (ref 1.7–7.7)
Neutrophils Relative %: 79 %
Platelets: 331 10*3/uL (ref 150–400)
RBC: 3.62 MIL/uL — ABNORMAL LOW (ref 4.22–5.81)
RDW: 14.7 % (ref 11.5–15.5)
WBC: 16 10*3/uL — ABNORMAL HIGH (ref 4.0–10.5)
nRBC: 0 % (ref 0.0–0.2)

## 2021-01-03 LAB — COMPREHENSIVE METABOLIC PANEL
ALT: 20 U/L (ref 0–44)
AST: 26 U/L (ref 15–41)
Albumin: 3.1 g/dL — ABNORMAL LOW (ref 3.5–5.0)
Alkaline Phosphatase: 52 U/L (ref 38–126)
Anion gap: 10 (ref 5–15)
BUN: 37 mg/dL — ABNORMAL HIGH (ref 8–23)
CO2: 23 mmol/L (ref 22–32)
Calcium: 9.1 mg/dL (ref 8.9–10.3)
Chloride: 100 mmol/L (ref 98–111)
Creatinine, Ser: 1.07 mg/dL (ref 0.61–1.24)
GFR, Estimated: >60 mL/min (ref >60)
Glucose, Bld: 112 mg/dL — ABNORMAL HIGH (ref 70–99)
Potassium: 4.5 mmol/L (ref 3.5–5.1)
Sodium: 133 mmol/L — ABNORMAL LOW (ref 135–145)
Total Bilirubin: 0.6 mg/dL (ref 0.3–1.2)
Total Protein: 6.3 g/dL — ABNORMAL LOW (ref 6.5–8.1)

## 2021-01-03 LAB — URINALYSIS, ROUTINE W REFLEX MICROSCOPIC
Bilirubin Urine: NEGATIVE
Glucose, UA: NEGATIVE mg/dL
Hgb urine dipstick: NEGATIVE
Ketones, ur: NEGATIVE mg/dL
Leukocytes,Ua: NEGATIVE
Nitrite: NEGATIVE
Protein, ur: 30 mg/dL — AB
Specific Gravity, Urine: 1.018 (ref 1.005–1.030)
pH: 6 (ref 5.0–8.0)

## 2021-01-03 NOTE — Telephone Encounter (Signed)
[name] (DPR signed) left v/m that pt had covid booster on 01/01/21 and since then was lethargic, no energy, and pts mobility was shot. I spoke with [name] pt had pfizer 2nd booster on 01/01/21 at [employer]. Immunization list updated. pts leg muscle are very shaky and weak even with walker. Pt fell Tuesday night and hit his head; no evidence of cut or bruise or swelling. No one witnessed the fall pt got himself back in bed by himself. No neg reactions to first set of covid vaccines and 1st covid booster. No H/A or dizziness or CP or SOB. Lt leg has always been "gimpy" in the last 5 yrs. No evidence that lt leg is worse but the strength in both legs are causing legs to be shaky.No difficulty in speech except when pt first wakes up it takes a little bit of time for pt to start talking but that is not new symptom. [name] said pt is acting his age which is very unusual for him. Since pt had 2nd covid booster on 01/31/21 and no one witnessed the fall on 01/31/21 and is not sure if pt lost consciousness or not and pt is not acting his usual self [name] will have pt taken to [facility] or [facility] by ambulance for eval and any needed testing. [name] does not think she can get pt in the car. [name] will send email or mychart update with pts condition after ED visit. Sending note to DR [employer].

## 2021-01-03 NOTE — Discharge Instructions (Addendum)
There were no signs of serious injuries.  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  Encouraged him to drink plenty of fluids and eat a regular diet.  Use Tylenol every 4 hours if needed for pain.  See his doctor if needed for problems.

## 2021-01-03 NOTE — ED Triage Notes (Addendum)
Fall Tuesday 4/26, +head injury, denies LOC, n/v, dizziness. As per medic ambulatory at scene. Pt denies any symptoms, daughter wanted him to get a CT scan of his head. Denies use of anticoagulants.

## 2021-01-03 NOTE — ED Provider Notes (Signed)
[facility] DEPT Provider Note   CSN: [id number] Arrival date & time: 01/03/21  1546     History Chief Complaint  Patient presents with  . Fall    [name] is a 87 y.o. male.  HPI Is here for evaluation of fall x2 which occurred during the night.  Patient lives alone, with help during the day but is alone at night.  He reportedly fell out of bed, twice during the night, because he "sleeps at the edge of the bed."  History from patient was assisted by daughter at bedside.  Patient was able ambulate after fall using his walker.  Patient's daughter is concerned that he has been a little more confused and weaker, with general malaise since getting a COVID-vaccine booster, 2 days ago.  No documented fever.  He is not coughing.  He is not vomiting.  His behavior has been otherwise normal.  Patient's daughter reports he is declining somewhat over the last several months.  There are no other known active modifying factors.    Past Medical History:  Diagnosis Date  . Cancer [facility])    prostate  . Carotid artery stenosis   . Degenerative disc disease   . Fractures    compression  . History of prostate cancer   . Hyperlipidemia   . Hypertension   . Kyphosis   . Non-cardiac chest pain 04/11   hospital ? [facility]    Patient Active Problem List   Diagnosis Date Noted  . Cerumen impaction 10/03/2020  . Pedal edema 10/03/2020  . MCI (mild cognitive impairment) 10/03/2020  . Goals of care, counseling/discussion   . Palliative care by specialist   . Lower extremity weakness 06/14/2019  . Weakness 06/14/2019  . Hearing loss 09/14/2018  . Thickened nails 09/14/2018  . PVD (peripheral vascular disease) ([place]) 11/05/2017  . Fall at home 09/11/2017  . Poor balance 09/11/2017  . Vitamin D deficiency 08/28/2016  . Routine general medical examination at a health care facility 08/24/2015  . B12 deficiency 08/14/2014  . Low back pain 05/02/2014  . Urinary  frequency 05/02/2014  . Encounter for Medicare annual wellness exam 07/27/2013  . Motion sickness 04/04/2013  . Osteopenia 06/14/2012  . Hypothyroid 06/14/2012  . 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 FECAL OCCULT BLOOD 02/01/2010  . ANEMIA, MILD 01/23/2010  . Essential hypertension 02/23/2009  . Carotid artery stenosis 02/23/2009  . SPONDYLOSIS, LUMBAR 02/23/2009  . NECK PAIN, CHRONIC 02/23/2009  . Chronic back pain 02/23/2009  . H/O compression fracture of spine 02/23/2009  . HYPERCHOLESTEROLEMIA 02/21/2009  . History of prostate cancer 02/21/2009    Past Surgical History:  Procedure Laterality Date  . CARDIOVASCULAR STRESS TEST  2004   cardiolite negative  . carotid doppler  11/05   0-39% bilatteral  . herniated disc surgery  1990   L3-4  . [place]   right  . prostate cancer surgery    . PROSTATE SURGERY    . TONSILLECTOMY         Family History  Problem Relation Age of Onset  . Hypertension Father     Social History   Tobacco Use  . Smoking status: Never Smoker  . Smokeless tobacco: Never Used  Vaping Use  . Vaping Use: Never used  Substance Use Topics  . Alcohol use: Yes    Alcohol/week: 6.0 standard drinks    Types: 6 Glasses of wine per week  . Drug use: No    Home  Medications Prior to Admission medications   Medication Sig Start Date End Date Taking? Authorizing Provider  alendronate (FOSAMAX) 70 MG tablet TAKE 1 TABLET (70 MG TOTAL) BY MOUTH EVERY 7 (SEVEN) DAYS. TAKE WITH A FULL GLASS OF WATER ON AN EMPTY STOMACH. 10/31/20   Tower, Wynelle Fanny, MD  Alpha-D-Galactosidase [facility]) TABS Take 1 tablet by mouth as needed (before meals, to improve digestion).    [provider]  amLODipine (NORVASC) 5 MG tablet Take 1 tablet (5 mg total) by mouth daily. 09/22/19   Tower, Wynelle Fanny, MD  aspirin EC 81 MG EC tablet Take 81 mg by mouth 3 (three) times a week.    [provider]  Cholecalciferol (VITAMIN D-3) 25 MCG (1000 UT) CAPS Take  1,000-2,000 Units by mouth daily.    [provider]  donepezil (ARICEPT) 5 MG tablet Take 1 tablet (5 mg total) by mouth at bedtime. 10/03/20   Tower, Wynelle Fanny, MD  guaiFENesin (MUCINEX) 600 MG 12 hr tablet Take 600 mg by mouth 2 (two) times daily as needed for to loosen phlegm.     [provider]  levothyroxine (SYNTHROID) 50 MCG tablet Take 1 tablet (50 mcg total) by mouth daily before breakfast. 11/26/20   Tower, Wynelle Fanny, MD  Vibegron (GEMTESA) 75 MG TABS Take 1 tablet by mouth daily. 12/07/20   Irine Seal, MD  vitamin B-12 (CYANOCOBALAMIN) 100 MCG tablet Take 1 tablet (100 mcg total) by mouth every other day. 09/14/18   Tower, Wynelle Fanny, MD    Allergies    Simvastatin  Review of Systems   Review of Systems  All other systems reviewed and are negative.   Physical Exam Updated Vital Signs BP (!) 153/58 (BP Location: Right Arm)   Pulse 91   Temp 98.5 F (36.9 C) (Oral)   Resp 20   Ht 5\' 3"  (1.6 m)   Wt 69 kg   SpO2 100%   BMI 26.95 kg/m   Physical Exam Vitals and nursing note reviewed.  Constitutional:      General: He is not in acute distress.    Appearance: He is well-developed. He is not ill-appearing.  HENT:     Head: Normocephalic and atraumatic.     Comments: No visible injury to head or scalp.    Right Ear: External ear normal.     Left Ear: External ear normal.     Mouth/Throat:     Pharynx: No posterior oropharyngeal erythema.  Eyes:     Conjunctiva/sclera: Conjunctivae normal.     Pupils: Pupils are equal, round, and reactive to light.  Neck:     Trachea: Phonation normal.  Cardiovascular:     Rate and Rhythm: Normal rate and regular rhythm.     Heart sounds: Normal heart sounds.  Pulmonary:     Effort: Pulmonary effort is normal.     Breath sounds: Normal breath sounds.  Abdominal:     General: There is no distension.     Palpations: Abdomen is soft.     Tenderness: There is no abdominal tenderness.  Musculoskeletal:        General:  Normal range of motion.     Cervical back: Normal range of motion and neck supple.  Skin:    General: Skin is warm and dry.  Neurological:     Mental Status: He is alert and oriented to person, place, and time.     Cranial Nerves: No cranial nerve deficit.     Sensory: No  sensory deficit.     Motor: No abnormal muscle tone.     Coordination: Coordination normal.     Comments: No dysarthria, aphasia or ataxia  Psychiatric:        Mood and Affect: Mood normal.        Behavior: Behavior normal.        Thought Content: Thought content normal.        Judgment: Judgment normal.     ED Results / Procedures / Treatments   Labs (all labs ordered are listed, but only abnormal results are displayed) Labs Reviewed  COMPREHENSIVE METABOLIC PANEL - Abnormal; Notable for the following components:      Result Value   Sodium 133 (*)    Glucose, Bld 112 (*)    BUN 37 (*)    Total Protein 6.3 (*)    Albumin 3.1 (*)    All other components within normal limits  CBC WITH DIFFERENTIAL/PLATELET - Abnormal; Notable for the following components:   WBC 16.0 (*)    RBC 3.62 (*)    Hemoglobin 11.1 (*)    HCT 34.5 (*)    Neutro Abs 12.4 (*)    Monocytes Absolute 2.6 (*)    All other components within normal limits  URINALYSIS, ROUTINE W REFLEX MICROSCOPIC - Abnormal; Notable for the following components:   Protein, ur 30 (*)    Bacteria, UA RARE (*)    All other components within normal limits    EKG None  Radiology CT Head Wo Contrast  Result Date: 01/03/2021 CLINICAL DATA:  Fall 2 days ago without injury. No loss of consciousness. EXAM: CT HEAD WITHOUT CONTRAST TECHNIQUE: Contiguous axial images were obtained from the base of the skull through the vertex without intravenous contrast. COMPARISON:  Head CT 06/14/2019 FINDINGS: Brain: No acute hemorrhage or subdural collection. Generalized atrophy and ventriculomegaly, not significantly changed. Moderate periventricular and deep chronic small  vessel ischemia. No evidence of acute ischemia. Vascular: Atherosclerosis of skullbase vasculature without hyperdense vessel or abnormal calcification. Skull: No fracture or focal lesion. Sinuses/Orbits: No acute findings. Left cataract resection. Lobulated mucosal thickening of left maxillary sinus similar to prior. Other: None. IMPRESSION: 1. No acute intracranial abnormality. No skull fracture. 2. Unchanged atrophy, ventriculomegaly, and chronic small vessel ischemia. Electronically Signed   By: Keith Rake M.D.   On: 01/03/2021 18:41   CT Cervical Spine Wo Contrast  Result Date: 01/03/2021 CLINICAL DATA:  Polytrauma, critical, head/C-spine injury suspected Fall 2 days ago. EXAM: CT CERVICAL SPINE WITHOUT CONTRAST TECHNIQUE: Multidetector CT imaging of the cervical spine was performed without intravenous contrast. Multiplanar CT image reconstructions were also generated. COMPARISON:  None. FINDINGS: Alignment: Moderate levo scoliotic curvature of the cervical spine. No evidence of jumped or perched facets. Skull base and vertebrae: No evidence of fracture. Dens and skull base are intact. C1-C2 degenerative change. Soft tissues and spinal canal: No obvious canal hematoma. No significant prevertebral soft tissue edema. Disc levels: Diffuse degenerative disc disease with disc space narrowing and endplate spurring at every level, preservation of C4-C5 disc space which is presumably chronic. Prominent multilevel facet hypertrophy. Upper chest: No acute findings. Other: None. IMPRESSION: 1. No acute fracture or subluxation of the cervical spine. 2. Moderate levo scoliotic curvature of the cervical spine with advanced multilevel degenerative disc disease and facet hypertrophy. Electronically Signed   By: Keith Rake M.D.   On: 01/03/2021 18:45    Procedures Procedures   Medications Ordered in ED Medications - No data to display  ED Course  I have reviewed the triage vital signs and the nursing  notes.  Pertinent labs & imaging results that were available during my care of the patient were reviewed by me and considered in my medical decision making (see chart for details).    MDM Rules/Calculators/A&P                           Patient Vitals for the past 24 hrs:  BP Temp Temp src Pulse Resp SpO2 Height Weight  01/03/21 1945 (!) 153/58 98.5 F (36.9 C) Oral 91 20 100 % -- --  01/03/21 1741 (!) 145/58 -- -- 80 16 95 % -- --  01/03/21 1610 (!) 144/67 98.5 F (36.9 C) Oral 91 17 95 % -- --  01/03/21 1606 -- -- -- -- -- -- 5\' 3"  (1.6 m) 69 kg  01/03/21 1605 -- -- -- -- -- -- -- 69 kg  01/03/21 1603 -- -- -- -- -- -- -- 69.4 kg    At time of discharge- reevaluation with update and discussion. After initial assessment and treatment, an updated evaluation reveals no change in clinical status, he remains comfortable without signs of injury and no further complaints.  Findings discussed with patient and wife, all questions answered. Daleen Bo   Medical Decision Making:  This patient is presenting for evaluation of fall x2, which does require a range of treatment options, and is a complaint that involves a moderate risk of morbidity and mortality. The differential diagnoses include head injury, complication of recent COVID-vaccine, occult metabolic or infectious process. I decided to review old records, and in summary elderly male, fell during the night, complaining of pain in head.  I obtained additional historical information from wife at bedside.  Clinical Laboratory Tests Ordered, included CBC, Metabolic panel and Urinalysis. Review indicates normal except protein in urine, glucose high, BUN high, total protein low, albumin low, white count high, hemoglobin low. Radiologic Tests Ordered, included CT head and CT cervical spine.  I independently Visualized: Radiographic images, which show no acute abnormality.    Critical Interventions-clinical evaluation, laboratory testing,  CT imaging, observation reassessment  After These Interventions, the Patient was reevaluated and was found without serious injury from fall.  No specific prodrome or complicating factors found on screening evaluation.  Incidental mild elevated white count, without fever, or overt signs of infection.  Vital signs reassuring at discharge.  Mild hypertension present.  No indication for hospitalization or further ED evaluation at this time.  CRITICAL CARE-no Performed by: Daleen Bo  Nursing Notes Reviewed/ Care Coordinated Applicable Imaging Reviewed Interpretation of Laboratory Data incorporated into ED treatment  The patient appears reasonably screened and/or stabilized for discharge and I doubt any other medical condition or other [facility] requiring further screening, evaluation, or treatment in the ED at this time prior to discharge.  Plan: Home Medications-continue usual medications, use Tylenol for pain; Home Treatments-rest, gradual advance activity; return here if the recommended treatment, does not improve the symptoms; Recommended follow up-PCP follow-up 1 week and as needed     Final Clinical Impression(s) / ED Diagnoses Final diagnoses:  Fall, initial encounter  Contusion of head, unspecified part of head, initial encounter    Rx / DC Orders ED Discharge Orders    None       Daleen Bo, MD 01/08/21 [phone]

## 2021-01-03 NOTE — Telephone Encounter (Signed)
Thanks so much for the heads up, I will be watching for correspondence

## 2021-01-04 DIAGNOSIS — Z7401 Bed confinement status: Secondary | ICD-10-CM | POA: Diagnosis not present

## 2021-01-04 DIAGNOSIS — M2550 Pain in unspecified joint: Secondary | ICD-10-CM | POA: Diagnosis not present

## 2021-01-04 DIAGNOSIS — I959 Hypotension, unspecified: Secondary | ICD-10-CM | POA: Diagnosis not present

## 2021-01-04 DIAGNOSIS — Z743 Need for continuous supervision: Secondary | ICD-10-CM | POA: Diagnosis not present

## 2021-01-16 ENCOUNTER — Telehealth: Payer: Self-pay | Admitting: Hospice

## 2021-01-16 DIAGNOSIS — Z515 Encounter for palliative care: Secondary | ICD-10-CM

## 2021-01-16 NOTE — Telephone Encounter (Signed)
NP called patient and also called [name] and Mardene Celeste with no success, left  voicemails with callback number.

## 2021-01-27 ENCOUNTER — Encounter: Payer: Self-pay | Admitting: Family Medicine

## 2021-01-27 DIAGNOSIS — R531 Weakness: Secondary | ICD-10-CM

## 2021-01-27 DIAGNOSIS — R627 Adult failure to thrive: Secondary | ICD-10-CM

## 2021-01-27 DIAGNOSIS — R54 Age-related physical debility: Secondary | ICD-10-CM

## 2021-01-28 DIAGNOSIS — R627 Adult failure to thrive: Secondary | ICD-10-CM | POA: Insufficient documentation

## 2021-01-28 DIAGNOSIS — R54 Age-related physical debility: Secondary | ICD-10-CM | POA: Insufficient documentation

## 2021-03-03 ENCOUNTER — Encounter: Payer: Self-pay | Admitting: Family Medicine

## 2021-03-08 DEATH — deceased

## 2021-03-13 ENCOUNTER — Ambulatory Visit: Payer: Medicare HMO | Admitting: Podiatry

## 2021-09-20 ENCOUNTER — Ambulatory Visit: Payer: Medicare HMO
